# Patient Record
Sex: Male | Born: 1950
Health system: Southern US, Community
[De-identification: ages and names within clinical notes are randomized; demographics above are authoritative.]

## PROBLEM LIST (undated history)

## (undated) DIAGNOSIS — J209 Acute bronchitis, unspecified: Principal | ICD-10-CM

## (undated) DIAGNOSIS — D126 Benign neoplasm of colon, unspecified: Principal | ICD-10-CM

## (undated) DIAGNOSIS — I219 Acute myocardial infarction, unspecified: Secondary | ICD-10-CM

## (undated) DIAGNOSIS — J440 Chronic obstructive pulmonary disease with acute lower respiratory infection: Principal | ICD-10-CM

## (undated) DIAGNOSIS — K5000 Crohn's disease of small intestine without complications: Secondary | ICD-10-CM

## (undated) DIAGNOSIS — K5730 Diverticulosis of large intestine without perforation or abscess without bleeding: Secondary | ICD-10-CM

## (undated) HISTORY — DX: Diverticulosis of large intestine without perforation or abscess without bleeding: K57.30

## (undated) HISTORY — DX: Benign neoplasm of colon, unspecified: D12.6

## (undated) HISTORY — PX: CORONARY ANGIOPLASTY WITH STENT PLACEMENT: SHX49

## (undated) HISTORY — DX: Chronic obstructive pulmonary disease with (acute) lower respiratory infection: J44.0

## (undated) HISTORY — PX: CERVICAL FUSION: SHX112

## (undated) HISTORY — DX: Crohn's disease of small intestine without complications: K50.00

## (undated) HISTORY — DX: Acute bronchitis, unspecified: J20.9

---

## 1998-05-22 ENCOUNTER — Ambulatory Visit (HOSPITAL_BASED_OUTPATIENT_CLINIC_OR_DEPARTMENT_OTHER): Admission: RE | Admit: 1998-05-22 | Discharge: 1998-05-22 | Payer: Self-pay | Admitting: Orthopedic Surgery

## 2001-12-01 ENCOUNTER — Encounter: Payer: Self-pay | Admitting: Emergency Medicine

## 2001-12-01 ENCOUNTER — Observation Stay (HOSPITAL_COMMUNITY): Admission: EM | Admit: 2001-12-01 | Discharge: 2001-12-03 | Payer: Self-pay | Admitting: Emergency Medicine

## 2002-03-11 ENCOUNTER — Encounter: Payer: Self-pay | Admitting: Family Medicine

## 2002-03-11 ENCOUNTER — Ambulatory Visit (HOSPITAL_COMMUNITY): Admission: RE | Admit: 2002-03-11 | Discharge: 2002-03-11 | Payer: Self-pay | Admitting: Family Medicine

## 2002-06-27 ENCOUNTER — Ambulatory Visit (HOSPITAL_COMMUNITY): Admission: RE | Admit: 2002-06-27 | Discharge: 2002-06-27 | Payer: Self-pay | Admitting: Family Medicine

## 2002-06-27 ENCOUNTER — Encounter: Payer: Self-pay | Admitting: Family Medicine

## 2002-11-29 ENCOUNTER — Ambulatory Visit (HOSPITAL_COMMUNITY): Admission: RE | Admit: 2002-11-29 | Discharge: 2002-11-29 | Payer: Self-pay | Admitting: Family Medicine

## 2002-11-29 ENCOUNTER — Encounter: Payer: Self-pay | Admitting: Family Medicine

## 2002-12-03 ENCOUNTER — Ambulatory Visit (HOSPITAL_COMMUNITY): Admission: RE | Admit: 2002-12-03 | Discharge: 2002-12-03 | Payer: Self-pay | Admitting: Family Medicine

## 2002-12-03 ENCOUNTER — Encounter: Payer: Self-pay | Admitting: Family Medicine

## 2003-10-06 ENCOUNTER — Emergency Department (HOSPITAL_COMMUNITY): Admission: EM | Admit: 2003-10-06 | Discharge: 2003-10-07 | Payer: Self-pay | Admitting: Emergency Medicine

## 2003-12-09 ENCOUNTER — Ambulatory Visit (HOSPITAL_COMMUNITY): Admission: RE | Admit: 2003-12-09 | Discharge: 2003-12-10 | Payer: Self-pay | Admitting: Neurosurgery

## 2004-06-11 ENCOUNTER — Ambulatory Visit (HOSPITAL_COMMUNITY): Admission: RE | Admit: 2004-06-11 | Discharge: 2004-06-11 | Payer: Self-pay | Admitting: Neurosurgery

## 2004-11-03 ENCOUNTER — Inpatient Hospital Stay (HOSPITAL_COMMUNITY): Admission: RE | Admit: 2004-11-03 | Discharge: 2004-11-05 | Payer: Self-pay | Admitting: Neurosurgery

## 2005-12-05 ENCOUNTER — Encounter: Admission: RE | Admit: 2005-12-05 | Discharge: 2005-12-05 | Payer: Self-pay | Admitting: Neurosurgery

## 2006-03-07 ENCOUNTER — Encounter
Admission: RE | Admit: 2006-03-07 | Discharge: 2006-06-05 | Payer: Self-pay | Admitting: Physical Medicine and Rehabilitation

## 2006-04-07 ENCOUNTER — Ambulatory Visit: Payer: Self-pay | Admitting: Physical Medicine and Rehabilitation

## 2006-05-24 ENCOUNTER — Ambulatory Visit: Payer: Self-pay | Admitting: Physical Medicine and Rehabilitation

## 2006-09-01 ENCOUNTER — Encounter
Admission: RE | Admit: 2006-09-01 | Discharge: 2006-11-30 | Payer: Self-pay | Admitting: Physical Medicine and Rehabilitation

## 2006-09-01 ENCOUNTER — Ambulatory Visit: Payer: Self-pay | Admitting: Physical Medicine and Rehabilitation

## 2007-02-21 ENCOUNTER — Ambulatory Visit: Payer: Self-pay | Admitting: Physical Medicine and Rehabilitation

## 2007-02-21 ENCOUNTER — Encounter
Admission: RE | Admit: 2007-02-21 | Discharge: 2007-05-22 | Payer: Self-pay | Admitting: Physical Medicine and Rehabilitation

## 2007-02-28 ENCOUNTER — Ambulatory Visit (HOSPITAL_COMMUNITY)
Admission: RE | Admit: 2007-02-28 | Discharge: 2007-02-28 | Payer: Self-pay | Admitting: Physical Medicine and Rehabilitation

## 2007-03-26 ENCOUNTER — Encounter
Admission: RE | Admit: 2007-03-26 | Discharge: 2007-06-24 | Payer: Self-pay | Admitting: Physical Medicine and Rehabilitation

## 2007-04-24 ENCOUNTER — Ambulatory Visit: Payer: Self-pay | Admitting: Physical Medicine and Rehabilitation

## 2007-06-19 ENCOUNTER — Ambulatory Visit: Payer: Self-pay | Admitting: Physical Medicine and Rehabilitation

## 2007-08-14 ENCOUNTER — Encounter
Admission: RE | Admit: 2007-08-14 | Discharge: 2007-11-12 | Payer: Self-pay | Admitting: Physical Medicine and Rehabilitation

## 2007-08-14 ENCOUNTER — Ambulatory Visit: Payer: Self-pay | Admitting: Physical Medicine and Rehabilitation

## 2007-08-17 ENCOUNTER — Ambulatory Visit (HOSPITAL_COMMUNITY): Admission: RE | Admit: 2007-08-17 | Discharge: 2007-08-17 | Payer: Self-pay | Admitting: Internal Medicine

## 2007-10-23 ENCOUNTER — Ambulatory Visit: Payer: Self-pay | Admitting: Physical Medicine and Rehabilitation

## 2008-05-13 ENCOUNTER — Encounter
Admission: RE | Admit: 2008-05-13 | Discharge: 2008-08-11 | Payer: Self-pay | Admitting: Physical Medicine and Rehabilitation

## 2008-05-16 ENCOUNTER — Ambulatory Visit: Payer: Self-pay | Admitting: Physical Medicine and Rehabilitation

## 2008-07-11 ENCOUNTER — Ambulatory Visit: Payer: Self-pay | Admitting: Physical Medicine and Rehabilitation

## 2008-09-02 ENCOUNTER — Encounter
Admission: RE | Admit: 2008-09-02 | Discharge: 2008-09-03 | Payer: Self-pay | Admitting: Physical Medicine and Rehabilitation

## 2008-09-03 ENCOUNTER — Ambulatory Visit: Payer: Self-pay | Admitting: Physical Medicine and Rehabilitation

## 2008-12-02 ENCOUNTER — Encounter
Admission: RE | Admit: 2008-12-02 | Discharge: 2008-12-03 | Payer: Self-pay | Admitting: Physical Medicine and Rehabilitation

## 2008-12-03 ENCOUNTER — Ambulatory Visit: Payer: Self-pay | Admitting: Physical Medicine and Rehabilitation

## 2009-02-16 ENCOUNTER — Encounter
Admission: RE | Admit: 2009-02-16 | Discharge: 2009-02-18 | Payer: Self-pay | Admitting: Physical Medicine and Rehabilitation

## 2009-02-18 ENCOUNTER — Ambulatory Visit: Payer: Self-pay | Admitting: Physical Medicine and Rehabilitation

## 2009-07-02 ENCOUNTER — Ambulatory Visit: Payer: Self-pay | Admitting: Family Medicine

## 2009-07-03 ENCOUNTER — Encounter: Payer: Self-pay | Admitting: Family Medicine

## 2009-07-04 ENCOUNTER — Ambulatory Visit: Payer: Self-pay | Admitting: Family Medicine

## 2009-07-23 ENCOUNTER — Ambulatory Visit: Payer: Self-pay | Admitting: Family Medicine

## 2010-10-31 ENCOUNTER — Ambulatory Visit: Payer: Self-pay | Admitting: Emergency Medicine

## 2010-10-31 DIAGNOSIS — J209 Acute bronchitis, unspecified: Secondary | ICD-10-CM | POA: Insufficient documentation

## 2010-11-03 ENCOUNTER — Telehealth (INDEPENDENT_AMBULATORY_CARE_PROVIDER_SITE_OTHER): Payer: Self-pay

## 2010-11-08 ENCOUNTER — Ambulatory Visit: Payer: Self-pay | Admitting: Emergency Medicine

## 2010-11-08 ENCOUNTER — Telehealth (INDEPENDENT_AMBULATORY_CARE_PROVIDER_SITE_OTHER): Payer: Self-pay | Admitting: *Deleted

## 2010-11-11 ENCOUNTER — Ambulatory Visit: Payer: Self-pay | Admitting: Critical Care Medicine

## 2010-11-11 ENCOUNTER — Encounter: Payer: Self-pay | Admitting: Critical Care Medicine

## 2010-11-11 DIAGNOSIS — J209 Acute bronchitis, unspecified: Secondary | ICD-10-CM

## 2010-11-11 DIAGNOSIS — J4489 Other specified chronic obstructive pulmonary disease: Secondary | ICD-10-CM | POA: Insufficient documentation

## 2010-11-11 DIAGNOSIS — F17200 Nicotine dependence, unspecified, uncomplicated: Secondary | ICD-10-CM

## 2010-11-11 DIAGNOSIS — J449 Chronic obstructive pulmonary disease, unspecified: Secondary | ICD-10-CM

## 2010-11-11 HISTORY — DX: Acute bronchitis, unspecified: J20.9

## 2010-12-09 ENCOUNTER — Ambulatory Visit: Admit: 2010-12-09 | Payer: Self-pay | Admitting: Critical Care Medicine

## 2010-12-18 ENCOUNTER — Encounter: Payer: Self-pay | Admitting: Neurosurgery

## 2010-12-19 ENCOUNTER — Encounter: Payer: Self-pay | Admitting: Neurosurgery

## 2010-12-30 NOTE — Progress Notes (Signed)
Summary: Courtesy call  Phone Note Outgoing Call   Call placed by: Tommas Olp CMA,  November 03, 2010 10:55 AM Summary of Call: Courtesy call - Courtesy mess LOVM of hm number. Initial call taken by: Tommas Olp CMA,  November 03, 2010 10:56 AM

## 2010-12-30 NOTE — Assessment & Plan Note (Signed)
Summary: Pulmonary Consultation   Copy to:  Dr. Fidela Salisbury Primary Tawonda Legaspi/Referring Markail Diekman:  none  CC:  Pulmonary Consult  - COPD, SOB., and COPD initial evaluation.  History of Present Illness: Pulmonary Consultation       This is a 61 year old man who presents for COPD initial evaluation.  The patient complains of history of diagnosed COPD, shortness of breath, chest tightness, wheezing, cough, mucous production, nocturnal awakening, and exercise induced symptoms, but denies chest pain worse with breathing and coughing and congestion.  Prior evaluation and testing has included Cxr.  The dyspnea is described as at rest, with slow ADL's, with walking within room, with walking one or two blocks, with walking stairs, with walking from the car to building, with walking to the mailbox and back, when sleeping, and during the day.  Associated disease(s) include(s) coronary artery disease, indigestion, abdominal pain, hoarseness, sneezing, productive cough, and non-productive cough.  Oxygen evaluation is described as not on supplemental O2.  Prior effective treatment has included short acting beta agonists and oral corticosteroids.    Pt has known hx of copd.  Pt with dyspnea for two years and worse if bends over. Rx recently has been rx pred pulse, ventolin/saba as needed.  This is the second pred pulse in one month.   Preventive Screening-Counseling & Management  Alcohol-Tobacco     Smoking Status: current     Packs/Day: 1.5     Pack years: 18  Current Medications (verified): 1)  Aspir-Low 81 Mg Tbec (Aspirin) .Marland Kitchen.. 1 Tab By Mouth Once Daily 2)  Cheratussin Ac 100-10 Mg/87m Syrp (Guaifenesin-Codeine) .... 5-10cc By Mouth Q6 Hrs As Needed For Cough 3)  Prednisone (Pak) 10 Mg Tabs (Prednisone) .... Use As Directed (12 Day Pack) 4)  Ventolin Hfa 108 (90 Base) Mcg/act Aers (Albuterol Sulfate) ..Marland Kitchen. 1-2 Puffs Q6 Hrs As Needed For Shortness of Breath 5)  Zithromax Z-Pak 250 Mg Tabs  (Azithromycin) .... Use As Directed  Allergies (verified): No Known Drug Allergies  Past History:  Past medical, surgical, family and social histories (including risk factors) reviewed, and no changes noted (except as noted below).  Past Medical History: HEART ATTACK, HX OF (ICD-410.90)   -2003  BRONCHITIS, ACUTE (ICD-466.0) COUGH   Past Surgical History: Neck surgery x 2   Family History: Reviewed history from 10/31/2010 and no changes required. Mother, D, intestional blockage Father, D, heart disease  Social History: Reviewed history from 10/31/2010 and no changes required. Single Disabled,  former aPublic relations account executive Current Smoker - 1/2 -1 pack daily, started at age [age]Alcohol use-yes - 5 drinks weekly Drug use-no Regular exercise-no 2 children  Packs/Day:  1.5 Pack years:  610 Review of Systems       The patient complains of shortness of breath with activity, shortness of breath at rest, productive cough, non-productive cough, irregular heartbeats, indigestion, loss of appetite, abdominal pain, headaches, sneezing, and depression.  The patient denies coughing up blood, chest pain, acid heartburn, weight change, difficulty swallowing, sore throat, tooth/dental problems, nasal congestion/difficulty breathing through nose, itching, ear ache, anxiety, hand/feet swelling, joint stiffness or pain, rash, change in color of mucus, and fever.        See HPI for Pulmonary, Cardiac, General, and ENT review of systems.  Vital Signs:  Patient profile:   60year old male Height:      70 inches Weight:      202.31 pounds BMI:     29.13 O2 Sat:  97 % on Room air Temp:     98.2 degrees F oral Pulse rate:   95 / minute BP sitting:   130 / 80  (left arm) Cuff size:   regular  Vitals Entered By: Raymondo Band RN (November 11, 2010 8:57 AM)  O2 Flow:  Room air CC: Pulmonary Consult  - COPD, SOB., COPD initial evaluation Comments Medications reviewed with  patient Daytime contact number verified with patient. Raymondo Band RN  November 11, 2010 8:58 AM    Physical Exam  Additional Exam:  Gen: Pleasant, well-nourished, in no distress,  normal affect ENT: No lesions,  mouth clear,  oropharynx clear, no postnasal drip Neck: No JVD, no TMG, no carotid bruits Lungs: No use of accessory muscles, no dullness to percussion, distant bs. no wheeze, no rhonchi Cardiovascular: RRR, heart sounds normal, no murmur or gallops, no peripheral edema Abdomen: soft and NT, no HSM,  BS normal Musculoskeletal: No deformities, no cyanosis or clubbing Neuro: alert, non focal Skin: Warm, no lesions or rashes    Impression & Recommendations:  Problem # 1:  COPD (ICD-496) Assessment Deteriorated Copd , severe emphysema, Golds stage II   FeV1 54% predicted, active smoking abuse plan Prednisone 56m.  4 a day for three days, three a day for 4 days two a day for 4 days then one a day until pak empty FSunocoAvelox one a day (3 samples 2 sent to pharmacy) Start Symbicort two puff twice daily Stop smoking, start Chantix Return 1 month [place]   Medications Added to Medication List This Visit: 1)  Cheratussin Ac 100-10 Mg/556mSyrp (Guaifenesin-codeine) .... 5-10cc by mouth  every 6 hours  as needed for cough 2)  Prednisone (pak) 10 Mg Tabs (Prednisone) .... Take 4 a day for three days then 3 a day for 4 days, two a day for 4 days then one a day until prednisone pak empty. 3)  Ventolin Hfa 108 (90 Base) Mcg/act Aers (Albuterol sulfate) ...Marland Kitchen 1-2 puffs every 4-6 hours  as needed for shortness of breath 4)  Avelox 400 Mg Tabs (Moxifloxacin hcl) .... By mouth daily 5)  Chantix Starting Month Pak 0.5 Mg X 11 & 1 Mg X 42 Misc (Varenicline tartrate) .... Take as directed---refills are to be for the continuing pak  Complete Medication List: 1)  Aspir-low 81 Mg Tbec (Aspirin) ...Marland Kitchen 1 tab by mouth once daily 2)  Cheratussin Ac 100-10 Mg/75m64myrp  (Guaifenesin-codeine) .... 5-10cc by mouth  every 6 hours  as needed for cough 3)  Prednisone (pak) 10 Mg Tabs (Prednisone) .... Take 4 a day for three days then 3 a day for 4 days, two a day for 4 days then one a day until prednisone pak empty. 4)  Ventolin Hfa 108 (90 Base) Mcg/act Aers (Albuterol sulfate) ....Marland Kitchen1-2 puffs every 4-6 hours  as needed for shortness of breath 5)  Avelox 400 Mg Tabs (Moxifloxacin hcl) .... By mouth daily 6)  Chantix Starting Month Pak 0.5 Mg X 11 & 1 Mg X 42 Misc (Varenicline tartrate) .... Take as directed---refills are to be for the continuing pak  Other Orders: New Patient Level V (99(47829pirometry w/Graph (94010)  Patient Instructions: 1)  Prednisone 62m62m4 a day for three days, three a day for 4 days two a day for 4 days then one a day until pak empty 2)  Finish Zpak 3)  Start Avelox one a day (3 samples 2 sent to pharmacy) 4)  Start Symbicort two puff twice daily 5)  Stop smoking, start Chantix 6)  Return 1 month [place]  Prescriptions: CHANTIX STARTING MONTH PAK 0.5 MG X 11 & 1 MG X 42  MISC (VARENICLINE TARTRATE) Take as directed---Refills are to be for the continuing pak  #1 month x 2   Entered and Authorized by:   Elsie Stain MD   Signed by:   Elsie Stain MD on 11/11/2010   Method used:   Electronically to        [facility] [phone]* (retail)       [place], [place]  [address]       Ph: [phone]       Fax: [phone]   RxID:   [id number] AVELOX 400 MG  TABS (MOXIFLOXACIN HCL) By mouth daily  #2 x 0   Entered and Authorized by:   Elsie Stain MD   Signed by:   Elsie Stain MD on 11/11/2010   Method used:   Electronically to        [facility] [phone]* (retail)       [place], [place]  [address]       Ph: [phone]       Fax: [phone]   RxID:   [id number] SYMBICORT 160-4.5 MCG/ACT  AERO (BUDESONIDE-FORMOTEROL FUMARATE) Two puffs twice daily  #1 x 6    Entered and Authorized by:   Elsie Stain MD   Signed by:   Elsie Stain MD on 11/11/2010   Method used:   Electronically to        [facility] [phone]* (retail)       [place], [place]  [address]       Ph: [phone]       Fax: [phone]   RxID:   [id number]     Appended Document: Pulmonary Consultation fax jeff henderson

## 2010-12-30 NOTE — Progress Notes (Signed)
  Phone Note Outgoing Call   Call placed by: Georgiann Mccoy,  November 08, 2010 12:52 PM Call placed to: Specialist Summary of Call: Made appt with Pulmonary, Dr Joya Gaskins in [facility] on Dec 15-9am, called pt and advised wife who was still here in office

## 2010-12-30 NOTE — Assessment & Plan Note (Signed)
Summary: CHEST CONGESTION/TM   Vital Signs:  Patient Profile:   60 Years Old Male CC:      Sore throat, cough x 3 days Height:     70.5 inches Weight:      205 pounds O2 Sat:      99 % O2 treatment:    Room Air Temp:     98.2 degrees F oral Pulse rate:   87 / minute Pulse rhythm:   regular Resp:     16 per minute BP sitting:   131 / 81  (left arm) Cuff size:   regular  Vitals Entered By: Georgiann Mccoy (October 31, 2010 1:21 PM)                  Current Allergies (reviewed today): No known allergies History of Present Illness History from: patient Chief Complaint: Sore throat, cough x 3 days History of Present Illness: Patient complains of onset of cold symptoms for 2 days.  They have been using no OTC meds. No sore throat + cough + chest congestion No pleuritic pain No wheezing + nasal congestion + post-nasal drainage + sinus pain/pressure No itchy/red eyes No earache No hemoptysis + SOB No chills/sweats + fever No nausea No vomiting No abdominal pain No diarrhea No skin rashes No fatigue No myalgias No headache   Current Meds ASPIR-LOW 81 MG TBEC (ASPIRIN) 1 tab by mouth once daily ZITHROMAX 250 MG TABS (AZITHROMYCIN) 2 by mouth on day 1, then 1 daily for 7 more days CHERATUSSIN AC 100-10 MG/5ML SYRP (GUAIFENESIN-CODEINE) 5-10cc by mouth Q6 hrs as needed for cough PREDNISONE (PAK) 10 MG TABS (PREDNISONE) 6-day taper, use as directed  REVIEW OF SYSTEMS Constitutional Symptoms      Denies fever, chills, night sweats, weight loss, weight gain, and fatigue.  Eyes       Denies change in vision, eye pain, eye discharge, glasses, contact lenses, and eye surgery. Ear/Nose/Throat/Mouth       Complains of frequent runny nose, sinus problems, sore throat, and hoarseness.      Denies hearing loss/aids, change in hearing, ear pain, ear discharge, dizziness, frequent nose bleeds, and tooth pain or bleeding.  Respiratory       Complains of dry cough and  shortness of breath.      Denies productive cough, wheezing, asthma, bronchitis, and emphysema/COPD.  Cardiovascular       Denies murmurs, chest pain, and tires easily with exhertion.    Gastrointestinal       Denies stomach pain, nausea/vomiting, diarrhea, constipation, blood in bowel movements, and indigestion. Genitourniary       Denies painful urination, kidney stones, and loss of urinary control. Neurological       Complains of headaches.      Denies paralysis, seizures, and fainting/blackouts. Musculoskeletal       Denies muscle pain, joint pain, joint stiffness, decreased range of motion, redness, swelling, muscle weakness, and gout.  Skin       Denies bruising, unusual mles/lumps or sores, and hair/skin or nail changes.  Psych       Denies mood changes, temper/anger issues, anxiety/stress, speech problems, depression, and sleep problems.  Past History:  Past Medical History: Reviewed history from 07/23/2009 and no changes required. Unremarkable  Past Surgical History: Reviewed history from 07/23/2009 and no changes required. Neck surgery Denies surgical history  Family History: Mother, D, Infection Father, D, UNK  Social History: Single Current Smoker - 1 pack daily, 44 yrs Alcohol use-yes -  5 drinks weekly Drug use-no Regular exercise-no Physical Exam General appearance: well developed, well nourished, mild distress.  Smells strongly of cig smoke Ears: normal, no lesions or deformities Nasal: clear discharge Oral/Pharynx: tongue normal, posterior pharynx without erythema or exudate Chest/Lungs: no rales, wheezes, or rhonchi bilateral, breath sounds equal without effort Heart: regular rate and  rhythm, no murmur Skin: no obvious rashes or lesions MSE: oriented to time, place, and person Assessment New Problems: BRONCHITIS, ACUTE (ICD-466.0) COUGH (ICD-786.2)  CXR normal  Patient Education: Patient and/or caregiver instructed in the following: rest,  fluids, Tylenol prn, Ibuprofen prn, quit smoking.  Plan New Medications/Changes: PREDNISONE (PAK) 10 MG TABS (PREDNISONE) 6-day taper, use as directed  #QS x 0, 10/31/2010, Fidela Salisbury MD CHERATUSSIN AC 100-10 MG/5ML SYRP (GUAIFENESIN-CODEINE) 5-10cc by mouth Q6 hrs as needed for cough  #6oz x 0, 10/31/2010, Fidela Salisbury MD ZITHROMAX 250 MG TABS (AZITHROMYCIN) 2 by mouth on day 1, then 1 daily for 7 more days  #QS x 0, 10/31/2010, Fidela Salisbury MD  New Orders: Est. Patient Level IV [16384] T-Chest x-ray, 2 views [71020] Pulse Oximetry (single measurment) [94760] Nebulizer Tx [94640] Albuterol Sulfate Sol 50m unit dose [[T3646]Ipratropium inhalation sol. unit dose [[O0321]Planning Comments:   1)  Take the prescribed antibiotic as instructed. 2)  Use nasal saline solution (over the counter) at least 3 times a day. 3)  Use over the counter decongestants like Zyrtec-D every 12 hours as needed to help with congestion. 4)  Can take tylenol every 6 hours or motrin every 8 hours for pain or fever. 5)  Follow up with your primary doctor  if no improvement in 5-7 days, sooner if increasing pain, fever, or new symptoms.     The patient and/or caregiver has been counseled thoroughly with regard to medications prescribed including dosage, schedule, interactions, rationale for use, and possible side effects and they verbalize understanding.  Diagnoses and expected course of recovery discussed and will return if not improved as expected or if the condition worsens. Patient and/or caregiver verbalized understanding.  Prescriptions: PREDNISONE (PAK) 10 MG TABS (PREDNISONE) 6-day taper, use as directed  #QS x 0   Entered and Authorized by:   JFidela SalisburyMD   Signed by:   JFidela SalisburyMD on 10/31/2010   Method used:   Print then Give to Patient   RxID:   [id number] AC 100-10 MG/5ML SYRP (GUAIFENESIN-CODEINE) 5-10cc by mouth Q6 hrs as needed for cough  #6oz x  0   Entered and Authorized by:   JFidela SalisburyMD   Signed by:   JFidela SalisburyMD on 10/31/2010   Method used:   Print then Give to Patient   RxID:   [id number] 250 MG TABS (AZITHROMYCIN) 2 by mouth on day 1, then 1 daily for 7 more days  #QS x 0   Entered and Authorized by:   JFidela SalisburyMD   Signed by:   JFidela SalisburyMD on 10/31/2010   Method used:   Print then Give to Patient   RxID:   [id number]  Medication Administration  Medication # 1:    Medication: Albuterol Sulfate Sol 169munit dose    Diagnosis: BRONCHITIS, ACUTE (ICD-466.0)    Dose: 3.0 mgs    Route: inhaled    Exp Date: 01/27/2011    Lot #: [id number]    Mfr: Mylan    Given by: AmGeorgiann MccoyDecember  4, 2011 2:29 PM)  Medication # 2:  Medication: Ipratropium inhalation sol. unit dose    Diagnosis: BRONCHITIS, ACUTE (ICD-466.0)    Dose: 0.5    Route: inhaled    Exp Date: 01/27/2011    Lot #: [id number]    Mfr: Mylan    Given by: Georgiann Mccoy (October 31, 2010 2:30 PM)  Orders Added: 1)  Est. Patient Level IV [94585] 2)  T-Chest x-ray, 2 views [71020] 3)  Pulse Oximetry (single measurment) [94760] 4)  Nebulizer Tx [94640] 5)  Albuterol Sulfate Sol 74m unit dose [J7613] 6)  Ipratropium inhalation sol. unit dose [[F2924]    Medication Administration  Medication # 1:    Medication: Albuterol Sulfate Sol 1551munit dose    Diagnosis: BRONCHITIS, ACUTE (ICD-466.0)    Dose: 3.0 mgs    Route: inhaled    Exp Date: 01/27/2011    Lot #: [id number]    Mfr: Mylan    Given by: AmGeorgiann MccoyDecember  4, 2011 2:29 PM)  Medication # 2:    Medication: Ipratropium inhalation sol. unit dose    Diagnosis: BRONCHITIS, ACUTE (ICD-466.0)    Dose: 0.5    Route: inhaled    Exp Date: 01/27/2011    Lot #: [id number]    Mfr: Mylan    Given by: AmGeorgiann MccoyDecember  4, 2011 2:30 PM)  Orders Added: 1)  Est. Patient Level IV [9[46286])  T-Chest x-ray, 2 views [71020] 3)  Pulse Oximetry  (single measurment) [94760] 4)  Nebulizer Tx [94640] 5)  Albuterol Sulfate Sol 51m14mnit dose [J7613] 6)  Ipratropium inhalation sol. unit dose [J7[N8177]

## 2010-12-30 NOTE — Assessment & Plan Note (Signed)
Summary: COUGH,SOB,HEADACHE/TJ Room 5   Vital Signs:  Patient Profile:   60 Years Old Male CC:      Trouble breathing Height:     70.5 inches Weight:      205 pounds O2 Sat:      97 % O2 treatment:    Room Air Temp:     98.7 degrees F oral Pulse rate:   84 / minute Pulse rhythm:   regular Resp:     20 per minute BP sitting:   153 / 79  (left arm) Cuff size:   regular  Vitals Entered By: Georgiann Mccoy (November 08, 2010 12:18 PM)                  Current Allergies (reviewed today): No known allergies History of Present Illness History from: patient Chief Complaint: Trouble breathing History of Present Illness: Patient complains of onset of cold symptoms for 10 days.  He was seen here last week for similar symptoms but is getting better.  Treated with Zpak, 6 day taper Pred and breathing treatment. His wife is still sick.  He is a heavy smoker.  He doesn't have a PCP. + sore throat + cough No pleuritic pain + wheezing No nasal congestion No post-nasal drainage No sinus pain/pressure + chest congestion No itchy/red eyes No earache No hemoptysis + SOB No chills/sweats No fever No nausea No vomiting No abdominal pain No diarrhea No skin rashes No fatigue No myalgias No headache   REVIEW OF SYSTEMS Constitutional Symptoms      Denies fever, chills, night sweats, weight loss, weight gain, and fatigue.  Eyes       Denies change in vision, eye pain, eye discharge, glasses, contact lenses, and eye surgery. Ear/Nose/Throat/Mouth       Denies hearing loss/aids, change in hearing, ear pain, ear discharge, dizziness, frequent runny nose, frequent nose bleeds, sinus problems, sore throat, hoarseness, and tooth pain or bleeding.  Respiratory       Complains of shortness of breath.      Denies dry cough, productive cough, wheezing, asthma, bronchitis, and emphysema/COPD.  Cardiovascular       Denies murmurs, chest pain, and tires easily with exhertion.     Gastrointestinal       Complains of stomach pain.      Denies nausea/vomiting, diarrhea, constipation, blood in bowel movements, and indigestion. Genitourniary       Denies painful urination, kidney stones, and loss of urinary control. Neurological       Denies paralysis, seizures, and fainting/blackouts. Musculoskeletal       Denies muscle pain, joint pain, joint stiffness, decreased range of motion, redness, swelling, muscle weakness, and gout.  Skin       Denies bruising, unusual mles/lumps or sores, and hair/skin or nail changes.  Psych       Denies mood changes, temper/anger issues, anxiety/stress, speech problems, depression, and sleep problems.  Past History:  Past Medical History: Reviewed history from 07/23/2009 and no changes required. Unremarkable  Past Surgical History: Reviewed history from 07/23/2009 and no changes required. Neck surgery Denies surgical history Physical Exam General appearance: well developed, well nourished, no acute distress.  smells of smoke Nasal: clear discharge Oral/Pharynx: tongue normal, posterior pharynx without erythema or exudate Neck: neck supple,  trachea midline, no masses Chest/Lungs: scattered rhonchi Heart: regular rate and  rhythm, no murmur Extremities: no swelling Skin: no obvious rashes or lesions MSE: oriented to time, place, and person Assessment Acute bronchitis with  bronchospasm  Plan New Medications/Changes: ZITHROMAX Z-PAK 250 MG TABS (AZITHROMYCIN) use as directed  #1 x 0, 11/08/2010, Fidela Salisbury MD VENTOLIN HFA 108 (90 BASE) MCG/ACT AERS (ALBUTEROL SULFATE) 1-2 puffs Q6 hrs as needed for shortness of breath  #1 x 1, 11/08/2010, Fidela Salisbury MD PREDNISONE (PAK) 10 MG TABS (PREDNISONE) use as directed (12 day pack)  #QS x 0, 11/08/2010, Fidela Salisbury MD  New Orders: Est. Patient Level IV [59733] Pulse Oximetry [94760] Planning Comments:   Another Rx for Zpak given and then gave another round of  Prednisone.  Also Rx for Albuterol.  Suggest patient see pulmonology since I expect COPD is present and he will need to be managed.  Also needs to establish with PCP.  If worsening symptoms, CP, more severe SOB, go to ER.    The patient and/or caregiver has been counseled thoroughly with regard to medications prescribed including dosage, schedule, interactions, rationale for use, and possible side effects and they verbalize understanding.  Diagnoses and expected course of recovery discussed and will return if not improved as expected or if the condition worsens. Patient and/or caregiver verbalized understanding.  Prescriptions: ZITHROMAX Z-PAK 250 MG TABS (AZITHROMYCIN) use as directed  #1 x 0   Entered and Authorized by:   Fidela Salisbury MD   Signed by:   Fidela Salisbury MD on 11/08/2010   Method used:   Print then Give to Patient   RxID:   [id number] VENTOLIN HFA 108 (90 BASE) MCG/ACT AERS (ALBUTEROL SULFATE) 1-2 puffs Q6 hrs as needed for shortness of breath  #1 x 1   Entered and Authorized by:   Fidela Salisbury MD   Signed by:   Fidela Salisbury MD on 11/08/2010   Method used:   Print then Give to Patient   RxID:   [id number] PREDNISONE (PAK) 10 MG TABS (PREDNISONE) use as directed (12 day pack)  #QS x 0   Entered and Authorized by:   Fidela Salisbury MD   Signed by:   Fidela Salisbury MD on 11/08/2010   Method used:   Print then Give to Patient   RxID:   [id number]   Orders Added: 1)  Est. Patient Level IV [66196] 2)  Pulse Oximetry [94098]

## 2011-04-12 NOTE — Assessment & Plan Note (Signed)
Marland Kitchen  David Woodard is a 60 year old single gentleman who is followed in  our [facility] for chronic pain related to his neck.   He has a history, which is positive for previous cervical spine surgery  fusion at C5-6 and C6-7 in January 2005 and December 2005 by Dr. Joya Salm.   He has had continual chronic neck pain described as an 8 on a scale of  10.  Pain is localized to the posterior neck radiating to the left  scapula.  Pain is worse with prolonged sitting or standing, improves  with medication and heat, as he reports fairly good relief with current  meds.   Medications prescribed through this clinic include Lyrica 50 mg twice a  day, Ultram 50 mg 4 times a day, and Lidoderm on a p.r.n. basis.  He  also occasionally uses over-the-counter Aleve not more than 2 tablets  every other day or so.   Functional status is as follows.  He is able to walk 30 minutes at a  time.  He can climb stairs.  He does not drive.  He is independent with  self-care, overall high functioning gentleman.   Occasionally, reports spasms in the neck and left shoulder.  Review of  systems is positive for shortness of breath.  He does smoke a pack of  cigarettes a day.  I have counseled him against this as well.   No changes in past medical, social, or family history.   I have on multiple occasions requested he follow up with primary care  for any other medical problems and preventative care.  I asked him if he  has a primary care physician.  He states I forget her name.  I asked  him what the name of the practice is.  He states he does not know the  name of the practice as well.  Again, I reiterate the importance of  having primary care doctor.   Exam today, blood pressure is 137/83, pulse 84, respiration 18, 98%  saturated on room air.  He is a well-developed, well-nourished gentleman  who does not appear in any distress.  He is oriented x3.  Speech is  clear.  Affect is bright.  He  is alert, cooperative, and pleasant.  Follows commands without difficulty, answers questions appropriately.   Cranial nerves and coordination are intact.  Reflexes are 1+ in the  upper extremities at biceps, triceps, brachioradialis, 1+ at the  patellar and zero at the Achilles tendons bilaterally.   He has good strength in both upper extremities without any focal deficit  appreciated.  Sensory exam is normal as well.  No deficits to light  touch are noted.   He has diminished range of motion in his cervical spine in all planes,  complains of pain especially with rotation to the left.  He has full  shoulder range of motion; however, he does complain a little bit of pain  in the left lateral neck with abduction of the left shoulder.  However,  he has full shoulder motion.   Transitioning from sitting to standing is done with ease.  Gait is  nonantalgic.  Tandem gait, Romberg tests are all performed without  difficulty.   IMPRESSION:  1. Cervicalgia.  2. December 09, 2003, C5-6, C6-S7 fusion, Dr. Joya Salm.  3. November 03, 2004, C5-6, C6-7 fusion, Dr. Joya Salm.   I have reviewed treatment options with David Woodard today to include using  TENS unit, cervical collar for conservative  management.  He finds his  medications currently prescribed Lyrica, Ultram, and Lidoderm are all  helping with overall reducing his cervicalgia.  He is overall pleased  with his pain management.  He expresses that he is not interested in any  type of invasive management at this time.  He has been stable on the  above medications.  He reports no adverse effects from these.  We will  see him back in 3 months.  He will need his medications called in for  him next month.           ______________________________  Franchot Gallo, M.D.     DMK/MedQ  D:  02/18/2009 09:58:28  T:  02/18/2009 23:30:19  Job #:  [id number]

## 2011-04-12 NOTE — Assessment & Plan Note (Signed)
David Woodard is a 60 year old gentleman who has returned to our pain and  rehabilitative clinic for a refill of his medications. He was last seen  by me on 04/25/2007.   He has a history of cervical fusion x2 by Dr. Joya Woodard, C5, C6, and C7 in  2005 and 2006.   He states that his average pain is about a 6 on a scale of 10 localized  to the posterior neck radiating to the left scapular region. The pain is  worse with activities, walking, sitting, and improves with rest, heat,  and he does use the cervical collar on a p.r.n. basis as well. He gets  good relief with the current medications that he is on.   Medications provided by this clinic include:  1. Lyrica 50 mg twice daily.  2. Ultracet 1 to 2 tablets b.i.d. p.r.n. 90 per month.   He is walking about 20 minutes at a time. He is independent with his  health care. He enjoys walking his dog. He reports no new changes on his  review of systems other than some intermittent shortness of breath. I  asked him to follow up with his primary care physician.   He recently was seen at an urgent care walk in clinic for some abdominal  complaints which are now resolved. This is within the last month.   He continues to smoke a pack of cigarettes per day.   PHYSICAL EXAMINATION:  VITAL SIGNS:  Blood pressure 129/67, pulse 86,  respirations 18, 97% saturated on room air.  GENERAL:  He is a well developed, well nourished gentleman who does not  appear in any distress. He is oriented x3. His affect is bright and  alert. He is cooperative and pleasant. He follows commands without  difficulty. He transitions from sitting to standing without difficulty.  Gait in the room is non-antalgic. Tandem gait and Romberg's test are  performed adequately. Reflexes are 1+ throughout the upper and lower  extremities. No abnormal tone is noted.  No clonus is noted. Motor  strength is within the 5/5 range without focal deficits. Rotation of his  neck to the right, he  has about 60% of his full motion and to the left  he has about 30% of full motion. He has full shoulder range of motion  however.   IMPRESSION:  1. Cervicalgia.  2. Status post C5, C6, C7 cervical fusion, Dr. Joya Woodard, 2005 and 2006.  3. Limitation in cervical range of motion.  4. Nicotine addiction.  5. Mild depression without suicidal ideation.  6. History of coronary artery disease.  7. History of severe chronic obstructive pulmonary disease.  8. History of peptic ulcer disease remotely.   PLAN:  1. We will refill his Ultracet 1 to 2 p.o. b.i.d. number of 90 with      one refill, and we will refill his Lyrica 50 mg 1      p.o. b.i.d. number of 60, two refills.  2. I encouraged him to follow up with his PCP for shortness of breath.      We will see him back in two months.           ______________________________  David Woodard, M.D.     DMK/MedQ  D:  06/20/2007 13:16:18  T:  06/21/2007 02:52:54  Job #:  [id number]

## 2011-04-12 NOTE — Assessment & Plan Note (Signed)
David Woodard is a 60 year old single white gentleman who has been  followed in our [facility] for about 2 years now for  chronic cervicalgia.  He is status post a fusion by Dr. Joya Salm at C5-C6  and C6-C7 in 2005 and 2006.  He is back in today for refill of his  medications.  He was last seen by me on October 29, 2007.  He states  that his pain is a bit worse since his medications ran out.  His average  pain is about an 8 on a scale of 10, localized to the cervical region  and the left scapular region.  Pain is worse with activities including  rotation of his neck.  His pain gets worse if he sits longer than 15  minutes and stands longer than 15 minutes or walks longer than 15-20  minutes.  He is constantly changing position.  He does use a cervical  collar intermittently to help him with his pain.  He has trialed the  TENS unit in the past, does not find it that helpful.  His sleep tends  to be poor.  Pain improved with heat, medications, topical patches, and  bracing.   He gets fair relief with the meds that are prescribed by this clinic.   He does not drive.  He is independent with his self-care.  Lives alone.  Enjoys watching the [employer] and he is interested in horse racing.   He has 2 horses, one's name is [employer] and the other's name is  [employer].   REVIEW OF SYSTEMS:  Denies problems controlling bowel or bladder.  He  denies depression or anxiety.  Denies suicidal ideation.  Denies any new  numbness.  He does report some intermittent tingling in the left hand  when he sits for a prolonged period of time.  No trouble walking.  Review of systems is negative for general symptoms, gastrointestinal, or  urinary symptoms.  He does report some occasional shortness of breath.   PAST MEDICAL HISTORY:  Remarkable for a remote history of MI and also  remote history of gastric ulcers.   SURGERY:  Status post C5-C6 and C6-C7 cervical fusion in 2005  and 2006  by Dr. Joya Salm.  Also, remote right rotator cuff surgery.   The patient denies illegal substance use.  He denies using alcohol.  He  smokes less than a pack of cigarette per Woodard.   No other changes in family history.   PHYSICAL EXAMINATION:  VITAL SIGNS:  Blood pressure is 124/87, pulse 95,  respirations 18, and 97% saturation on room air.  GENERAL:  He is a well-developed, well-nourished gentleman who does not  appear in any distress.  He is oriented x3.  Speech is clear.  Affect is  bright.  He is alert, cooperative, and pleasant.  He follows commands  without difficulty.   Transitioning from sitting-to-standing is done without difficulty.  Gait  in the room is normal.  Tandem gait and Romberg's tests are performed  adequately.   He has limitations in his cervical range of motion in all planes,  limited with flexion, extension, rotation, and lateral flexion in all  planes.  He has full shoulder range of motion.   Reflexes are diminished overall in the upper extremities, zero at the  biceps and triceps, and 1+ at the brachioradialis bilaterally.  He has  1+ reflex at the patellar tendon and zero at the ankles bilaterally.  No  abnormal tone is noted.  No clonus is noted.  Sensory exam is intact in  the upper as well as lower extremities.  Motor strength is 5/5 in the  upper extremities without focal abnormalities.   IMPRESSION:  1. Cervicalgia.  Overall, doing well functionally.  He has been off      his medications for a couple of months now.  He is complaining of      some increased pain.  His pain scores have increased from the 6-7      range up to 8/10 range currently.  He is requesting refill of his      medications now.  2. Status post C5-C6 and C6-C7 cervical fusion by Dr. Joya Salm in 2005      and 2006.  3. Nicotine addiction.  4. The patient currently denies any problems with depression or      anxiety.  He had admitted to some depression in the past;  however,      he states he does not have problems with that at this point.   At the last visit, I had requested he follow up with primary care.  He  states he has not obtained a primary care doctor yet.  He does see in  Urgent Care on as needed basis in [place].  I have again requested  that he find a primary care physician who he can get regular check ups  from, in light of the fact that he does have a history of heart disease,  COPD, and history of ulcer disease.  He states he understands and will  attempt to comply with this request.   At this point, for treatment of Mr. Kazmierski pain, he is not interested  in any kind of cervical injections.  He is not interested in physical  therapies as he has done that.  He has soft collars, cervical collar, as  well as the hard collar at home, which he uses on a p.r.n. basis.  We  will refill the following medications for him; Lidoderm 1 patch up to 12  hours on and 12 hours off #30 with 1 refill, Ultracet 1-2 p.o. t.i.d.  p.r.n. neck pain #180, 43-monthsupply with 1 refill, and Lyrica 50 mg 1  p.o. b.i.d. x2 weeks then t.i.d., 149-monthupply with 1 refill.  We will  see him back in 6-8 weeks.  He brought in a [employer].  This was filled out to the best that I can today.  I do not have  some of the surgical information and request details.  Mr. EbRodeheaveras  present as I filled it out and we will have it mailed for him.           ______________________________  DeFranchot GalloM.D.     DMK/MedQ  D:  05/16/2008 11:47:32  T:  05/17/2008 02:24:55  Job #:  [id number]

## 2011-04-12 NOTE — Assessment & Plan Note (Signed)
[name] is a 60 year old gentleman who is back in today to our [facility] for refill of his medications.   He was last seen by me on March 26, 2007.   He is status post cervical fusion x 2 by Dr. Joya Salm, C5-C6, C6-C7, 2005  and 2006.   He is back in today and states his average pain is about a 7 on a scale  of 10.  He is getting good relief with medications prescribed by this  clinic.  Pain is typically localized to the left cervical and scapular  region, overall improved since last visit.  He has been using a cervical  collar intermittently for his more painful days.   He is walking up to 20 minutes at a time.  He is independent with his  self care including meal prep.  He needs some assistance with higher  level household duties, shopping.  He admits to some depression, denies  suicidal ideation or anxiety, denies problems controlling bowel or  bladder.   No changes in his past medical, social or family history.  He is smoking  less than half a pack of cigarettes a day.  Again, I cautioned him  against this.  He currently does not have a primary care physician.  I  strongly encouraged him to find somebody he is comfortable with to  manage his general medical problems.   On exam today his blood pressure is 118/78, pulse 89, respirations 18,  98% saturated on room air.  He is a thin built gentleman who appears his  stated age.  He is oriented x 3.  His affect is bright, alert.  He is  cooperative and pleasant.  He follows commands without difficulty.   He transitions from sitting to standing easily.  Gait in the room is  nonantalgic.  His balance is good overall.  Tandem gait and Romberg  tests are performed adequately.   He has limitations in lumbar motion in all planes.  Seated reflexes are  1+ at the biceps and triceps bilaterally, 2+ at the brachioradialis, 2+  at the patellar tendons, zero to 1+ at bilateral Achilles tendons.  He  has no abnormal  tone noted.  Motor strength is good in the upper  extremities.  No focal weakness is appreciated.  He has limitations in  cervical range of motion, especially with rotation to the left.  He  lacks about 20% of his motion.  He does, however, have full shoulder  range of motion without pain in the shoulder joint.   Pin prick is tested and is intact in both upper extremities.  No focal  sensory deficits are appreciated.   IMPRESSION:  1. Cervicalgia.  2. Status post C5-C6, C6-C7 cervical fusion, Dr. Joya Salm, 2005, 2006.  3. Limitation in cervical range of motion.  4. Nicotine addiction.  5. Mild depression without suicidal ideation.  6. History of coronary artery disease.  7. History of COPD.  8. History of peptic ulcer disease remotely.   PLAN:  Encourage him to continue to use his cervical collar on a p.r.n.  basis to help manage his pain.  He states that this has helped quite a  bit over the last month.  We will refill his Ultracet 1-2 p.o. b.i.d.  #90 no refills, and Lyrica 50 mg 1 p.o. b.i.d. #60 3 refills. We will  see him back in two months.  He has been stable on these medications.  He is  not  displaying any aberrant behavior.  He is obtaining good relief with  the current medications that he is on.  He is able to maintain a  functional lifestyle, independent with self care and some lighter  household type activities.           ______________________________  Franchot Gallo, M.D.     DMK/MedQ  D:  04/25/2007 10:34:32  T:  04/25/2007 11:55:49  Job #:  [id number]   cc:   Leeroy Cha, M.D.  Fax: [phone]

## 2011-04-12 NOTE — Assessment & Plan Note (Signed)
FOLLOW UP VISIT NOTE   HISTORY OF PRESENT ILLNESS:  The patient is a 60 year old white  gentleman who is being seen in our pain and rehabilitative clinic for  chronic cervicalgia.  He has a history of cervical fusion times two by  Dr. Kristeen Mans at C5-6, C6-7 in 2005 and 2006.   He is back in today and is presenting with increased neck pain.  Previous pain scores were at the last visit in July was 6/10 and today  he report 9/10.  He has a 3 week history of increased pain.  He denies  any falls, trauma, any injuries to the neck.  He denies any kind of  activity that may have exacerbated his pain.   He states that it came on gradually and has developed into the point  where it is a constant deep achy sharp pain, which is constant.  He has  had poor sleep for the last week.  He has been sleeping alternately in  his recliner or his bed.   He denies any new weakness, numbness, tingling.  He reports his balance  is good.  No history of falls.  Bowel and bladder are working fine as  well.  For treatment he has been taking Motrin 400 to 600 mg up to 4  times a day.  Denies any abdominal complaints.  He has a soft collar,  which he has been wearing rather sporadically for a couple hours a day  over the last 3 weeks.   Denies any increased pain into the upper extremities or down the lower  extremities.  Again no weakness.   He reports his activity and enjoyment of life is almost completely  interfered with this new level of pain.  The pain is worse with walking,  sitting and standing and improves with heat and rest, as well as  medications.  He is getting fair relief with current medications he is  on.   CURRENT MEDICATIONS:  Medications provided by this clinic include:  Lidoderm patches on a p.r.n. basis; Lyrica 50 mg, 1 p.o. b.i.d.; and  Ultracet up to 4 tablets per day on a p.r.n. basis.   REVIEW OF SYSTEMS:  Positive for neck spasms and some depression.  Denies suicidal ideation.  Review  of systems otherwise noncontributory.   SOCIAL HISTORY:  He continues to smoke a little less that 1/2 a pack of  cigarettes a day.  He lives alone.   PAST MEDICAL HISTORY:  No other changes in his past medical history.  He  reports he has had a recent physical with [facility].  He does not  remember the name of the physician who examined him but he was told he  was in good health.   PHYSICAL EXAMINATION:  Blood pressure 134/88; pulse 92; respirations 18;  97% saturation on room air.  He is a well-developed, well-nourished  gentleman who appears somewhat tired and in a minimal amount of  discomfort.   He is oriented times 3.  His speech is clear.  His affect is alert.  He  is cooperative and pleasant.  He follows commands without difficulty.   Transitioning from sit to stand is done without any difficulty as well,  although it is a bit slow.  His gait in the room is normal.  Tandem gait  and Romberg's test were performed adequately.  He has full shoulder  range of motion bilaterally.  He is noted as he stands to hold his neck  just  approximately 5 degrees slightly rotated to the right.  He has  limitations with rotation to the left not more than 15 degrees at this  point and about 30 degrees of rotation to the right.  Flexion and  extension are significantly limited as well.   His reflexes are 2+ at the triceps and brachioradialis, 1+ at the biceps  bilaterally, 2+ at the patellar tendons and 0 at the ankles bilaterally.   His motor strength is quite good in both upper and lower extremities.  No focal weakness is appreciated at shoulder abductors biceps, triceps,  brachioradialis, finger flexors are intrinsic.  Lower extremity is  intact as well.   IMPRESSION:  1. Exacerbation of cervicalgia.  2. Status C5-6 and C6-7 cervical fusion by Dr. Kristeen Mans in 2005 and      2006.  3. Limitation in cervical range of motion.  4. Nicotine addiction.  5. Mild depression.   Medical  problems include:  History of coronary artery disease, history  of chronic obstructive pulmonary disease and history of peptic ulcer  disease remotely.   Will start patient on some Prilosec 20 mg q. day.  Will increase his  Ultracet from 2 tablets twice a day up to 2 tablets 3 times a day on a  p.r.n. basis.  This was written for #180.  Will give him some Ambien 10  mg, 1 p.o. q. at bedtime p.r.n. insomnia, #15.   Recommend he use his soft collar over the next several days more than  just a couple hours a day and will obtain flexion and extension cervical  spine views, as well as an AP.  Will see him back in 2 weeks.           ______________________________  Franchot Gallo, M.D.     DMK/MedQ  D:  08/16/2007 09:03:47  T:  08/16/2007 11:33:55  Job #:  [id number]

## 2011-04-12 NOTE — Assessment & Plan Note (Signed)
David Woodard is a 60 year old white gentleman who is being followed in our  pain and rehabilitative clinic today. He is being seen for chronic  cervicalgia. He has a history of fusion x2 with Dr.  Joya Salm at C5-6, C6-  7 in 2005 and 2006.   He is back in today. States his overall pain level has decreased. He has  been on Celebrex for about a month and has been taking Prilosec. He  initially had pain scores in the 9/10 range and today they are down to a  6 or 7.   He had been prescribed up to 6 Ultracet per day. He is taking only about  4, occasionally 5 tablets a day now. His sleep is fair. Pain continues  to be located in the same area on the left posterior cervical region  radiating to the left scapular region and described as sharp and aching.  Worse with activity and improved with medication and heat as well as a  soft cervical collar.   He gets good relief with the current medications at this point. He is  walking about 15 minutes at a time. He is able to climb stairs. He is  independent with his self cares.   PAST MEDICAL HISTORY:  No changes since last visit.   SOCIAL HISTORY:  No changes since last visit.  Continues to smoke about  a pack of cigarettes a day.   FAMILY HISTORY:  No changes since last visit.   Incidentally, he did note that he has been started on an inhaler for his  lungs recently.   CURRENT MEDICATIONS:  Prescribed by this clinic include:  1. Ultracet 1-2 p.o. up to three times a day.  2. He had been on Prilosec this month at 20 mg one p.o. daily.  3. Celebrex one p.o. daily.   PHYSICAL EXAMINATION:  Blood pressure is up slightly at 155/98. Pulse  87. Respiratory rate: 18. He is 98% saturated on room air. He is well-  developed, well-nourished gentleman. Does not appear in any distress. He  is oriented x3. Speech is clear. Affect is bright. He is alert,  cooperative and pleasant. Follows commands without difficulty.   Transitioning from sit-to-stand  easily. Moves without pain behaviors.  Tandem gait, Romberg test are performed adequately. His limitations in  cervical motion especially with rotation to the left. He lacks about 10  degrees compared to right. Full shoulder range of motion is noted. Motor  strength is 5/5 in the upper extremities without focal weakness.  Reflexes are 1+ in the upper extremities and 2+ in the patellar tendons  and 1+ at the Achilles tendons. No abnormal tone is noted. No clonus is  noted.   IMPRESSION:  1. Cervicalgia. Overall improved.  2. Status post C5-6, C6-7 cervical fusion by Dr.  Joya Salm in 2005 and      2006.  3. Nicotine addiction.  4. Mild depression.   David Woodard also has medical problems which include coronary artery  disease, history of chronic obstructive pulmonary disease and history of  peptic ulcer disease remotely.   PLAN:  Will discontinue his Celebrex today. I would like him to monitor  his blood pressure this month and followup with his primary care doctor  if it continues to stay up. He states he understands this and will  comply. He states that he does not need a refill on his Ultracet at this  time. Will hold off. He will call us if he wants a  refill of the  Ultracet. When he calls anticipate filling this medication 1-2 tablets  p.o. t.i.d. p.r.n. neck pain #180. Will see him back in two months  otherwise. He has been stable on these medications and he takes them as  directed. He uses a soft collar appropriately and is able to maintain a  functional lifestyle.           ______________________________  Franchot Gallo, M.D.     DMK/MedQ  D:  08/29/2007 13:16:51  T:  08/29/2007 20:54:53  Job #:  [id number]

## 2011-04-12 NOTE — Assessment & Plan Note (Signed)
David Woodard is a 60 year old white gentleman who has been followed in our  pain and rehabilitative clinic for cervicalgia.   He is status post a cervical fusion with Dr. Joya Woodard at C5-6 and C6-7 in  2005 and 2006.   He is back in today for a refill of his medications.   He states his average pain is between a 6 and an 8 on a scale of 10.  Pain is described as sharp and aching interfering significantly with his  general activities.  Pain is typically worse with walking, bending,  sitting.  Improved with heat and medications.  He has fair to good  relief with the current medications prescribed by our clinic.   Medications from our clinic include Ultracet 1-2 p.o. t.i.d. p.r.n. neck  pain.  He also uses Prilosec on a p.r.n. basis, Ambien on a p.r.n. basis  not more than 15 per month, and Lyrica 50 mg 1 p.o. b.i.d.   He is able to walk at least 10 minutes at a time.  He is able to climb  stairs.  He is currently no driving.  He is independent with his self  care and overall mobility.   No changes regarding his review of systems.  He does complain of some  wheezing and shortness of breath and poor appetite.   He does state incidentally that he had an episode of chest pain about 4-  6 weeks ago which he attributed to taking his Ambien and he has stopped  taking it since then.   PAST MEDICAL, SOCIAL, FAMILY HISTORY:  Unchanged.   PHYSICAL EXAMINATION TODAY:  His blood pressure is 127/81, pulse 95,  respirations 18, 97% saturated on room air.  He is a well-developed,  well-nourished gentleman who does not appear in any distress.  He is  oriented x3.  Speech is clear.  Affect is bright.  He is alert,  cooperative, and pleasant.  He follows commands without difficulty.   Transitioning from sitting to standing is done with ease.  He has  limitations in cervical range of motion and he is lacking approximately  30% of extension as well as 25% of rotation to the left.  He has some  rotation to  the right lacking as well.   Full shoulder range of motion is noted.   Balance is good.  Tandem gait and Romberg test are performed adequately.  Reflexes are otherwise symmetric and intact in the upper extremities.  No abnormal tone is noted.  Motor strength is in the 5/5 range without  focal deficit.   IMPRESSION:  1. Cervicalgia.  Overall doing well functionally.  Last visit was      early October.  He has not had any significant flareups during that      period.  2. Status post C5-6, C6-7 cervical fusion by Dr. Joya Woodard in 2005 and      2006.  3. Nicotine addiction.  4. Mild depression.   PLAN:  David Woodard history is of some recent chest pain.  I have asked  him to follow up with his primary care physician.  I do not think that  the chest pain is related to the one time dose he took of the Ambien  several weeks ago.  He has not taken his Ambien since then; however, he  does have a history of coronary artery disease as well as COPD and a  history of peptic ulcer disease.  I would like him to followup.  I  have  suggested we could make an appointment for him.  However, he has  deferred this and would like to call on his own.   The following medications were prescribed for him today:  Ultracet 1-2  p.o. t.i.d. p.r.n. neck pain, #180, no refills.  He was also given a  prescription for Lyrica 50 mg 1 p.o. b.i.d. #60, 3 refills.   I will see him back in a month.  He takes his medications as prescribed.  He has not exhibited any aberrant behavior.           ______________________________  David Woodard, M.D.     DMK/MedQ  D:  10/29/2007 11:47:25  T:  10/29/2007 12:15:04  Job #:  [id number]

## 2011-04-12 NOTE — Assessment & Plan Note (Signed)
[name] is a pleasant 60 year old gentleman who is followed in our  Pain and Rehabilitative Clinic for chronic cervicalgia.  He states he  has been doing pretty good over the last month.  Average pain is about a  6 on a scale of 10.  Sleep tends to be poor and pain interferes  significantly with activity levels.  He reports fair pain relief with  current meds.   Pain is localized to the posterior cervical region, radiating to the  left scapula occasionally and the right upper trapezius region.   No pain in the arms.  No weakness in the extremities are noted.  No  trouble with walking or balance.   FUNCTIONAL STATUS:  He is able to walk about 10 minutes at a time.  He  is able to climb stairs.  He is not able to drive.  He is independent  with self-care.   REVIEW OF SYSTEMS:  Negative for problems controlling bowel or bladder.  Denies depression, anxiety, or suicidal ideation.  Negative for any  other problems with respect to review of systems.   No changes in past medical, social, or family history since last visit.   MEDICATIONS:  Prescribed through this clinic include;  1. Lidoderm on a p.r.n. basis.  2. Ultracet 1-2 tablets twice a day.  3. Lyrica 50 mg t.i.d.   PHYSICAL EXAMINATION:  VITAL SIGNS:  Blood pressure is 131/78, pulse 94,  respirations 18, and 96% saturated on room air.  GENERAL:  He is a well-developed, well-nourished gentleman, who does not  appear in any distress.  He is oriented x3.  Speech is clear.  Affect is  bright.  He is alert, cooperative, and pleasant.  Follows commands  without any difficulty.  CNS:  Cranial nerves are grossly intact.  Coordination is intact.  Reflexes are 1+ in the upper and lower extremities.  No abnormal tone is  noted.  No clonus is noted.  Sensation is intact.  MUSCULOSKELETAL:  Reveals decreased range of motion especially with  rotation to the left today, lacks about 70% of his range of motion.  He  has full shoulder range of  motion; however, lumbar motion is mildly  limited without pain.   IMPRESSION:  Cervicalgia.  Overall, functionally doing well.  He is  taking Ultracet as well as Lyrica.  Reports there are no problems with  these medications.  Overall, they seem to be helping.  He reports fair  relief with their use.  He takes these medications as prescribed.  No  aberrant behavior has been observed.  He has no new medical problems and  has been stable on his medications.  We will give him a 67-monthsupply.  We will see him back in 3 months.  He will give [place] call if he has any  trouble in the interim.           ______________________________  DFranchot Gallo M.D.     DMK/MedQ  D:  09/03/2008 12:17:05  T:  09/04/2008 02:07:17  Job #:  [id number]

## 2011-04-12 NOTE — Assessment & Plan Note (Signed)
David Woodard is a 60 year old gentleman who is followed in our Pain and  Rehabilitative Clinic for chronic cervicalgia.  He states I have been  doing pretty good in the last month.   He states he purchased a Conservation officer, historic buildings for his neck and  back, which also provides heat.  He got it at [facility],  and he uses it  in his chair and states that it helps quite a bit with his neck pain.   He states his average pain is between a 6 and 7 on a scale of 10,  continues to be localized predominantly to the left posterior cervical  region.  The pain is described as sharp, stabbing, and aching, worse  during the daytime and evening, exacerbated by walking and sitting,  improves with heat.   MEDICATIONS:  He reports fair to good relief with current medications  from this clinic.   FUNCTIONAL STATUS:  He is able to walk at least 15 minutes at a time.  Other functions status includes he is able to climb stairs.  He is not  driving.  He is independent with his self-care, needs assistance with  heavier household tasks.   REVIEW OF SYSTEMS:  Otherwise negative except for shortness of breath.   I have asked him to follow up with his PCP; he does not have one yet.  He states he is in the process of obtaining a PCP.   Past medical, social, and family history are otherwise unchanged.   Medications which are provided through this clinic include Ultracet 1-2  p.o. t.i.d. #180 per month, Lyrica 50 mg t.i.d., and p.r.n. Lidoderm.   PHYSICAL EXAMINATION:  VITAL SIGNS:  Blood pressure is 134/84, pulse 86,  respirations 18, and 96% saturated on room air.  GENERAL:  He is a well-developed, well-nourished gentleman who does not  appear in any distress.  NEUROLOGIC:  He is oriented x3.  Speech is clear.  His affect is bright.  He is alert, cooperative, and pleasant.  He follows commands without  difficulty.  Cranial nerves are grossly intact.  Coordination is intact.  Reflexes are diminished  overall.  Tone is normal.  No tremors or clonus  is noted.  Sensation is unchanged from previous exams.  He is able to  transition easily from sitting to standing.  His balance is good.  Gait  is not antalgic.  Tandem gait and Romberg tests are performed  adequately.  MUSCULOSKELETAL:  Diminished range of motion especially with rotation to  the right, he lacks 70% of his rotation to the right, 20% rotation to  the left, and he lacks 50% of his motion with flexion and extension.  He  has full shoulder range of motion without pain today.   IMPRESSION:  Cervicalgia.  Overall doing well functionally.  He is  tolerating Ultracet well, has no problems with over-sedation or  constipation.  He is taking about 4 tablets a day.   He takes his medications as prescribed.  No aberrant behavior has been  noted.  His pain is fairly well managed, and he remains quite functional  despite some significant orthopedic impairments.  We will see him back  in 2 months.  He is given a 11-monthsupply of his medications.           ______________________________  DFranchot Gallo M.D.     DMK/MedQ  D:  07/11/2008 12:51:48  T:  07/12/2008 04:09:35  Job #:  [id number]

## 2011-04-12 NOTE — Assessment & Plan Note (Signed)
David Woodard is a pleasant 60 year old gentleman, who is followed in our  Pain and Rehabilitative Clinic for chronic cervicalgia.  He was last  seen by me on September 03, 2008.  In the interim, with respect to pain, he  is on about average.  His pain score is about 7 on a scale of 10.  He  finds that the pain medications are so helpful for him.   He does state that he had been ill in December.  He had a fairly severe  bronchitis and was on antibiotics.  He states that the amount of  coughing he had done has made his abdominal muscle sore and did increase  the pain in his neck a bit as well.   Overall, he feels he has recovered quite a bit and is pretty much back  to baseline now.   Pain continues to be localized in the posterior cervical region,  radiating through the left scapular region.  The pain is described as  sharp, stabbing, aching, variable, depending on the day.  Pain is worse  with prolonged activities and walking, bending, sitting, and standing.   He continues to use heat, Lidoderm patches, and medication which all  seem to help his pain.   FUNCTIONAL STATUS:  Independent.   He is able to walk about 40 minutes at a time.  He is able to climb  stairs.  He does not drive.  He is independent with self care including  meal prep.   He denies problems controlling bowel or bladder, problems with dizziness  or trouble walking.  He does report some neck spasms occasionally.  Denies depression, anxiety, or suicidal ideations.   Past medical, social, and family history as noted above.  He does report  that after his bronchitis, he has decreased his cigarette smoking to  about a half a pack of cigarettes per day now.   MEDICATIONS:  Prescribed through our Pain and Rehabilitative Clinic  include:  1. Lidoderm patches on a p.r.n. basis.  2. Ultracet 1-2 tablets twice a day.  3. Lyrica 50 mg 3 times a day.   PHYSICAL EXAMINATION:  VITAL SIGNS:  Blood pressure is 145/86, pulse 90,  respirations 18, and 99% saturated on room air.  GENERAL:  He is a well-developed, well-nourished gentleman, who does not  appear in any distress.   He is oriented x3.  His speech is clear.  Affect is bright.  He is  alert, cooperative, and pleasant.  He follows commands without  difficulty and answers all questions appropriately.   Coordination and cranial nerves are grossly intact.  His reflexes are  intact, 1+ in the upper and lower extremities.   Motor strength is 5/5 without focal deficit in the upper and lower  extremities.   He has limitations in cervical range of motion in all planes.  He has  full shoulder range of motion, however.   IMPRESSION:  Cervicalgia.   David Woodard is overall doing well functionally.  He is not having any  problems with side effects from the Ultracet or the Lyrica or the  Lidoderm.  He is requesting refills on all of these.  I have not noted  any aberrant behavior with him.  He reports overall good function as  well.   Today, we will switch his Ultracet to Ultram to decrease the amount of  acetaminophen he takes in, in a day.  We will put him on Ultram 50 mg 1  p.o. q.i.d.  He had been on Ultracet up to 6 tablets per day.  We will  also refill his Lyrica 50 mg 1 p.o. b.i.d., and we will also give him a  refill on his Lidoderm.  We will see him back in 3 months.  He has been  stable on the above medications and reports overall good relief with the  use.           ______________________________  Franchot Gallo, M.D.     DMK/MedQ  D:  12/03/2008 13:19:55  T:  12/04/2008 05:16:05  Job #:  [id number]

## 2011-04-15 NOTE — H&P (Signed)
David Woodard, ROSTAD NO.:  [id number]   MEDICAL RECORD NO.:  [id number]          PATIENT TYPE:  INP   LOCATION:  2899                         FACILITY:  [facility]   PHYSICIAN:  Leeroy Cha, M.D.   DATE OF BIRTH:  1951-02-27   DATE OF ADMISSION:  11/04/2004  DATE OF DISCHARGE:                                HISTORY & PHYSICAL   HISTORY OF PRESENT ILLNESS:  Mr. Caggiano is a gentleman who underwent fusion  at the level of 5-6, 6-7.  This happened at the beginning of this year, but  nevertheless, he had been complaining of neck pain with radiation to the  shoulder and it is worse with movement.  Because of the findings, we went  ahead and did a workup which included an MRI and CT scan which showed that  there is a lucency at the level of 5-6 in one of the screws and another one  in the second screw.  Because of that, the diagnosis of pseudoarthrosis was  made.  I was able to get him an Designer, multimedia, which he wore for a week and  he had realized that when his neck was immobilized, the pain was better.  Because of that, we are going to fuse him from behind.  He is a heavy smoker  and he was fully advised to stop smoking; so far, he has been unable to.   PAST MEDICAL HISTORY:  1.  Shoulder surgery.  2.  Previous fusion at the level of 5-6 and 6-7.   ALLERGIES:  He is not allergic to any medications.   SOCIAL HISTORY:  He drinks socially.  He smokes +2 to 3 packs a day.   FAMILY HISTORY:  Unremarkable.   REVIEW OF SYSTEMS:  Review of systems is positive for shortness of breath,  some history of back pain and productive cough.   PHYSICAL EXAMINATION:  HEENT:  Head, eyes, ears, nose and throat normal.  NECK:  There is scar anteriorly.  He is able to flex, but extension and  lateralization cause discomfort going to the shoulder.  LUNGS:  Rhonchi bilaterally.  HEART:  Heart sounds normal.  ABDOMEN:  Normal.  EXTREMITIES:  Normal pulses.  NEUROLOGIC:  Mental status  normal.  Cranial nerves normal.  Strength normal.  Sensation normal.  Reflexes normal.   CLINICAL IMPRESSION:  1.  C5-C6, C6-C7 pseudoarthrosis.  2.  Chronic bronchitis secondary to history of heavy smoking.   RECOMMENDATIONS:  The patient is being admitted for a posterior 5-6, 6-7  fusion with lateral mass screws and BMP.  He knows about the risks such as  paralysis, no improvement whatsoever, need for further surgery, failure for  him to fuse, phlebitis and all the risks associated with the smoking.       EB/MEDQ  D:  11/03/2004  T:  11/03/2004  Job:  [id number]

## 2011-04-15 NOTE — Cardiovascular Report (Signed)
. [facility]  Patient:    KHALIQ, TURAY Visit Number: [id number] MRN: [id number]          Service Type: MED Location: [id number] Attending Physician:  Jenne Campus Dictated by:   Ramond Dial., M.D. Proc. Date: 12/03/01 Admit Date:  12/01/2001   CC:         Cardiac Catheterization Lab   Cardiac Catheterization  HISTORY:  Mr. Matranga is a 60 year old gentleman with a history of cigarette smoking.  He was admitted with episodes of chest pain.  He is scheduled for heart catheterization for further evaluation.  CARDIOLOGIST:  Ramond Dial., M.D.  PROCEDURE:  Left heart catheterization with coronary angiography.  PROCEDURE DESCRIPTION:  The right femoral artery was easily cannulated using a modified Seldinger technique.  HEMODYNAMICS:  The left ventricular pressure was 103/13 with an aortic pressure was 103/69.  ANGIOGRAPHY:  The left main coronary artery smooth and is essentially normal.  The left anterior descending artery is a moderate to large vessel.  There are some minor luminal irregularities, but otherwise the LAD is normal.  It is somewhat tortuous.  There is moderate size diagonal branch which is normal.  The left circumflex artery is a moderate size vessel and is normal.  The right coronary artery is moderate to large and is dominant.  There are minor luminal irregularities.  There is a proximal stenosis of approximately 20%, but this is not flow restricting.  LEFT VENTRICULOGRAM:  A left ventriculogram was performed in the 30 RAO position.  It reveals normal left ventricular systolic function with and ejection fraction of 65%.  There are no segmental wall motion abnormalities. There is no mitral regurgitation.  COMPLICATIONS:  None.  CONCLUSIONS:  Essentially normal coronary arteries.  He has a few minor luminal irregularities but no lesions that would be considered obstructive. He will be able  to be discharged soon. Dictated by:   Ramond Dial., M.D. Attending Physician:  Jenne Campus DD:  12/03/01 TD:  12/03/01 Job: [id number]

## 2011-04-15 NOTE — Group Therapy Note (Signed)
David Woodard is kindly referred by Dr. Leeroy Cha for pain management of  his chronic cervicalgia.   David Woodard is a 60 year old gentleman who is accompanied by his girlfriend  today to our pain and rehabilitative clinic.  He states that he has  undergone two cervical spine surgeries, both by Dr. Joya Salm, one in 2005.  He  had an anterior fusion at C5-C6, C6-C7 and then in 2006 underwent a  posterior instrumentation apparently at C5-C6, C6-C7.  I have a CT scan of  his cervical spine, dated December 05, 2005, which showed solid fusion at C5  to C7.  No significant stenosis above or below the fusion site.  Hardware  intact.  A small central protrusion at C3-C4 without neural encroachment.  This study was ready by Dr. Rolla Flatten.   David Woodard states his pain is about on average an 8 on a scale of 10, worst  in the morning and towards the end of the day.  He reports he does not sleep  very well at all.  The pain is exacerbated by a variety of activities, it  improves with rest and heat.  He gets some relief with the use of Aleve or  Advil which he takes once or twice a day.  He states he does not get much  relief with Vicodin or Tylox.   He reports he is able to walk about 10 minutes at a time.  He is able to  climb stairs.  He currently does not drive.  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  Currently, he spends most of his day watching TV, taking care of some  animals which he has at home, sleeping.  He is independent with his self  care, meal prep, needs some assistance with shopping and household duties.   REVIEW OF SYSTEMS:  Health and history form positive review of systems for  trouble walking, spasm, dizziness, depression.  Denies suicidal ideation  however.  Also positive for wheezing, coughing, poor appetite shortness of  breath.   PAST MEDICAL HISTORY:  1.  Positive for history of ulcers about 30 years ago.  2.  History of  possible heart disease, status post angioplasty.  3.  MI in 2002.   PAST SURGICAL HISTORY:  1.  Positive for right elbow surgery, 1996.  2.  Right shoulder surgery.  3.  Anterior fusion in 2005, C5-C6, C6-C7, Dr. Joya Salm.  4.  Posterior fusion approach C5-C6 and C6-C7, 2006, Dr. Joya Salm.  No op      notes regarding this particular procedure however.   SOCIAL HISTORY:  The patient is divorced, lives with his girlfriend and her  27 year old son.  He has had a DUI back in 1991.  He does admit to drinking  two beers a week.  His girlfriend states he drinks up to every day on some  occasions.  Also, admits to smoking a pack and a half of cigarettes a day  for 40 years.   FAMILY HISTORY:  Positive for heart disease, alcohol abuse.   PHYSICAL EXAMINATION:  VITAL SIGNS:  Today, blood pressure 132/73, pulse 81,  respirations 16, 97% saturated on room air.  GENERAL:  He is a thin adult gentleman, does not appear in any distress.  He  is oriented x3.  His affect is bright, alert.  He is cooperative.  He is  pleasant.  MUSCULOSKELETAL:  He transitions from a sit-to-stand without difficulty.  During  his gait cycle, he displays good balance, normal gait overall.  Tandem gait is without much difficulty.  Romberg test is negative.  Range of  motion in the shoulders is within normal limits.  He has limitations in  cervical range of motion especially with rotation to the right and right  lateral flexion.  NEUROLOGIC:  His reflexes are evaluated.  There is 1+ in the upper  extremities and symmetric, 1+ at the patellar tendons, 0 at the Achilles  tendons bilaterally.  He reports diminished sensation over the left anterior  tibial area slightly compared to right.  Otherwise intact throughout both  upper extremities.  Motor strength is good in both upper and lower  extremities.  No focal weakness is noted.   IMPRESSION:  1.  Status post C5-C6, C6-C7 fusion, Dr. Joya Salm, 2005 and 2006.  2.   Cervicalgia.  3.  Nicotine addiction.  4.  Insomnia.  5.  History of ETOH use.  6.  Mild depression.  7.  Also appears to have a history of heart disease, possibly chronic      obstructive pulmonary disease.  8.  History of ulcer disease, remotely.   The patient has not been followed regularly by a primary care physician.  On  health and history form he does admit to some shortness of breath.  He is a  smoker.  He also has had a heart attack back in 2002.   1.  I strongly advised the patient to follow up with a primary care      physician for other medical problems.  2.  I would like to check LFTs and basic metabolic package.  Check renal      function as well.  3.  We have reviewed options for him regarding his cervicalgia.  Topical      agents and TENS unit were reviewed.  He has not tried Lidoderm.  He says      he can not afford a TENS unit or physical therapy.  We also discussed      use of medications.  At this point, would like to check liver function      and basic metabolic panel prior to providing medications.  4.  We will trial him this month on some Neurontin 300 to 600 mg at night,      #45, with two refills.  5.  We will trial him on some Lidoderm, samples were provided today.      Nursing staff reviewed the use of this medication with him as well.  6.  We discussed other medications to help with the cervicalgia, considering      Ultracet at this point in addition to some of the nonsteroidals he is      using.  I reviewed how to use nonsteroidals appropriately in addition to      Tylenol today.  7.  Also discussed the possibility of doing medial branch blocks with him.      He believes this may be cost prohibitive for him however.  8.  At this point, we will just check his urine drug screen, LFTs, basic      metabolic package.  9.  We will see him back in a month and consider Ultracet next month.          ______________________________  Franchot Gallo,  M.D.     DMK/MedQ  D:  04/07/2006 13:31:22  T:  04/08/2006 15:24:52  Job #:  [id number]   cc:  Leeroy Cha, M.D.  Fax: [phone]

## 2011-04-15 NOTE — Op Note (Signed)
David Woodard, David Woodard NO.:  [id number]   MEDICAL RECORD NO.:  [id number]          PATIENT TYPE:  INP   LOCATION:  FOOT                         FACILITY:  [facility]   PHYSICIAN:  Leeroy Cha, M.D.   DATE OF BIRTH:  21-Jun-1951   DATE OF PROCEDURE:  11/03/2004  DATE OF DISCHARGE:                                 OPERATIVE REPORT   DATE OF OPERATION:  November 03, 2004.   PREOPERATIVE DIAGNOSES:  1.  C5-C6, C6-C7 cervical arthrosis.  2.  Chronic neck pain.   POSTOPERATIVE DIAGNOSES:  1.  C5-C6, C6-C7 cervical arthrosis.  2.  Chronic neck pain.   PROCEDURE:  1.  Posterior C5-C6-C7 lateral mass and screws.  2.  Fusion with a BMP.   SURGEON:  Leeroy Cha, MD.   ASSISTANT:  Earleen Newport, MD.   CLINICAL HISTORY:  The patient was admitted because of neck pain.  This  gentleman almost a year ago underwent surgery at the levels of C4-5 and C5-  6.  Although he had no weakness in the neck and the arms, he had the  complaint of neck pain, which gets worse with movement.  We immobilized him  with a cervical collar, and the pain went away.  X-rays showed the  possibility of cervical arthrosis at the levels of C5-6 and C6-7.  Because  of that, surgery was advised through a posterior approach.   PROCEDURE:  The patient was taken to the OR and after intubation he was  positioned in a prone manner using a 3-piece head holder.  The neck was  painted with Betadine.  A midline incision from C2-3 down to C6-C7 was made.  Muscles were retracted laterally.  After we had a good retraction, we were  able to see the lateral aspect of the facet.  Patient had a little bit of  scoliosis without hyper__________ of the facet.  With the fluoroscopy, we  identified the mid point at the facet of C5, C6, and C7.  In some of them,  we introduced a drill.  We used a 25-45 entrance point and the other one 30-  30.  The reason for this procedure was because of the situation of the  facet.   This was done with the help of the fluoroscopy and we were able to  introduce the screws about 10 mm at the level of C5, C6, and C7 lateral  mass.  Then, this procedure was done bilaterally.  Then, the cartilage of C5-  6 and C6-7 facet was removed.  Then, a BMP was introduced in the two facets  bilaterally.  Then, a rod was applied to the screws and a cap was used  to secure that in place.  Then, we went ahead and we drilled the surface of  the facet as well as the lamina and the spinous process, and the rest of the  BMP was applied in this area.  The lateral cervical spine showed good  position of the screws.  From there on, the area was irrigated and the wound  was closed with Vicryl and  Steri-Strips.        ___________________________________________  Leeroy Cha, M.D.    EB/MEDQ  D:  11/03/2004  T:  11/03/2004  Job:  [id number]

## 2011-08-06 ENCOUNTER — Encounter: Payer: Self-pay | Admitting: Emergency Medicine

## 2011-08-06 ENCOUNTER — Inpatient Hospital Stay (INDEPENDENT_AMBULATORY_CARE_PROVIDER_SITE_OTHER)
Admission: RE | Admit: 2011-08-06 | Discharge: 2011-08-06 | Disposition: A | Discharge status: Home / Self Care | Payer: Medicare Other | Source: Ambulatory Visit | Attending: Emergency Medicine | Admitting: Emergency Medicine

## 2011-08-06 DIAGNOSIS — IMO0002 Reserved for concepts with insufficient information to code with codable children: Secondary | ICD-10-CM

## 2011-10-31 NOTE — Progress Notes (Signed)
Summary: insect bite?/TM(RM4)   Vital Signs:  Patient Profile:   60 Years Old Male CC:      INSECT BITE Height:     70 inches Weight:      210.50 pounds O2 Sat:      95 % O2 treatment:    Room Air Temp:     98.3 degrees F oral Pulse rate:   88 / minute Resp:     18 per minute BP sitting:   134 / 92  (left arm) Cuff size:   regular  Vitals Entered By: Earley Favor RN (August 06, 2011 12:26 PM)                  Updated Prior Medication List: ASPIR-LOW 81 MG TBEC (ASPIRIN) 1 tab by mouth once daily  Current Allergies (reviewed today): No known allergies History of Present Illness Chief Complaint: INSECT BITE History of Present Illness: Insect bite on R forearm about 4-5 days ago.  ?spider bite.  It swelled up quickly and then started to drain.  It is still sore, but he feels it is much less painful and smaller.  He was encouraged to have it checked out by his wife.  No F/C/N/V.   REVIEW OF SYSTEMS Constitutional Symptoms      Denies fever, chills, night sweats, weight loss, weight gain, and fatigue.  Eyes       Denies change in vision, eye pain, eye discharge, glasses, contact lenses, and eye surgery. Ear/Nose/Throat/Mouth       Denies hearing loss/aids, change in hearing, ear pain, ear discharge, dizziness, frequent runny nose, frequent nose bleeds, sinus problems, sore throat, hoarseness, and tooth pain or bleeding.  Respiratory       Denies dry cough, productive cough, wheezing, shortness of breath, asthma, bronchitis, and emphysema/COPD.  Cardiovascular       Denies murmurs, chest pain, and tires easily with exhertion.    Gastrointestinal       Denies stomach pain, nausea/vomiting, diarrhea, constipation, blood in bowel movements, and indigestion. Genitourniary       Denies painful urination, kidney stones, and loss of urinary control. Neurological       Denies paralysis, seizures, and fainting/blackouts. Musculoskeletal       Complains of redness and  swelling.      Denies muscle pain, joint pain, joint stiffness, decreased range of motion, muscle weakness, and gout.  Skin       Denies bruising, unusual mles/lumps or sores, and hair/skin or nail changes.  Psych       Denies mood changes, temper/anger issues, anxiety/stress, speech problems, depression, and sleep problems. Other Comments: BITTEN X5 DAYS AGO   Past History:  Past Medical History: Reviewed history from 11/11/2010 and no changes required. HEART ATTACK, HX OF (ICD-410.90)   -2003  BRONCHITIS, ACUTE (ICD-466.0) COUGH   Past Surgical History: Reviewed history from 11/11/2010 and no changes required. Neck surgery x 2   Family History: Reviewed history from 11/11/2010 and no changes required. Mother, D, intestional blockage Father, D, heart disease  Social History: Reviewed history from 11/11/2010 and no changes required. Single Disabled,  former Public relations account executive  Current Smoker - 1/2 -1 pack daily, started at age 68 Alcohol use-yes - 5 drinks weekly Drug use-no Regular exercise-no 2 children Physical Exam General appearance: well developed, well nourished, no acute distress MSE: oriented to time, place, and person R dorsal mid-forearm with an abscess approx 1.5cm in diameter with mild induration, no fluctuance with erythema surrounding  it approx 2cm in either direction.  It is tender to palpation, no drainage expressed.  Distal NV status is intact.  FROM of shoulder, elbow, and wrist. Assessment New Problems: ABSCESS, ARM (ICD-682.3)   Patient Education: Patient and/or caregiver instructed in the following: Ibuprofen prn.  Plan New Medications/Changes: DOXYCYCLINE HYCLATE 100 MG CAPS (DOXYCYCLINE HYCLATE) 1 by mouth two times a day for 10 days  #20 x 0, 08/06/2011, Fidela Salisbury MD  New Orders: Est. Patient Level III [id number] Planning Comments:   Since it's already draining and improving, will first place on Rx for Doxy.  Likely staph  infection with minimal cellulitis.  Will hold off on I&D for now, and only do if worsening again or just not improving.  Warm compresses and not squeezing it encouraged.  Follow-up with your primary care physician if not improving or if getting worse   The patient and/or caregiver has been counseled thoroughly with regard to medications prescribed including dosage, schedule, interactions, rationale for use, and possible side effects and they verbalize understanding.  Diagnoses and expected course of recovery discussed and will return if not improved as expected or if the condition worsens. Patient and/or caregiver verbalized understanding.  Prescriptions: DOXYCYCLINE HYCLATE 100 MG CAPS (DOXYCYCLINE HYCLATE) 1 by mouth two times a day for 10 days  #20 x 0   Entered and Authorized by:   Fidela Salisbury MD   Signed by:   Fidela Salisbury MD on 08/06/2011   Method used:   Print then Give to Patient   RxID:   [id number]   Orders Added: 1)  Est. Patient Level III [40352]

## 2012-12-31 ENCOUNTER — Encounter: Payer: Self-pay | Admitting: *Deleted

## 2012-12-31 ENCOUNTER — Emergency Department (INDEPENDENT_AMBULATORY_CARE_PROVIDER_SITE_OTHER)
Admission: EM | Admit: 2012-12-31 | Discharge: 2012-12-31 | Disposition: A | Discharge status: Home / Self Care | Payer: Medicare Other | Source: Home / Self Care | Attending: Family Medicine | Admitting: Family Medicine

## 2012-12-31 ENCOUNTER — Emergency Department (INDEPENDENT_AMBULATORY_CARE_PROVIDER_SITE_OTHER): Payer: Medicare Other

## 2012-12-31 DIAGNOSIS — J209 Acute bronchitis, unspecified: Secondary | ICD-10-CM | POA: Diagnosis not present

## 2012-12-31 DIAGNOSIS — R05 Cough: Secondary | ICD-10-CM

## 2012-12-31 DIAGNOSIS — J988 Other specified respiratory disorders: Secondary | ICD-10-CM | POA: Diagnosis not present

## 2012-12-31 DIAGNOSIS — J398 Other specified diseases of upper respiratory tract: Secondary | ICD-10-CM | POA: Diagnosis not present

## 2012-12-31 DIAGNOSIS — J449 Chronic obstructive pulmonary disease, unspecified: Secondary | ICD-10-CM

## 2012-12-31 HISTORY — DX: Acute myocardial infarction, unspecified: I21.9

## 2012-12-31 MED ORDER — DOXYCYCLINE HYCLATE 100 MG PO CAPS: 100.0000 mg | ORAL_CAPSULE | Freq: Two times a day (BID) | ORAL | Status: DC

## 2012-12-31 MED ORDER — PREDNISONE 20 MG PO TABS: 20.0000 mg | ORAL_TABLET | Freq: Two times a day (BID) | ORAL | Status: DC

## 2012-12-31 MED ORDER — BENZONATATE 200 MG PO CAPS: 200.0000 mg | ORAL_CAPSULE | Freq: Every day | ORAL | Status: DC

## 2012-12-31 NOTE — ED Provider Notes (Signed)
History     CSN: [id number]  Arrival date & time 12/31/12  1117   First MD Initiated Contact with Patient 12/31/12 1129      Chief Complaint  Patient presents with  . Cough  . URI      HPI Comments: Patient developed URI symptoms two weeks ago but did not feel ill initially.  His cough has gradually worsened, and he has developed increased wheezing and shortness of breath with activity.  No pleuritic pain.  Yesterday he had a fever, and over past four days has had sensation of dry mouth.  He noted some improvement when he took an albuterol nebulizer treatment.  He continues to smoke.  Past history of pneumonia.  The history is provided by the patient and the spouse.    Past Medical History  Diagnosis Date  . MI (myocardial infarction)     Past Surgical History  Procedure Date  . Coronary angioplasty with stent placement     Family History  Problem Relation Age of Onset  . Hypertension Father   . Heart disease Father     History  Substance Use Topics  . Smoking status: Current Every Day Smoker -- 1.5 packs/day    Types: Cigarettes  . Smokeless tobacco: Never Used  . Alcohol Use: Yes      Review of Systems + sore throat + cough No pleuritic pain + wheezing + nasal congestion ? post-nasal drainage No sinus pain/pressure No itchy/red eyes No earache No hemoptysis + SOB + fever yesterday, + chills No nausea No vomiting + abdominal pain with coughing No diarrhea No urinary symptoms No skin rashes + fatigue No myalgias + headache   Allergies  Review of patient's allergies indicates no known allergies.  Home Medications   Current Outpatient Rx  Name  Route  Sig  Dispense  Refill  . ASPIRIN 81 MG PO TABS   Oral   Take 81 mg by mouth daily.         Marland Kitchen BENZONATATE 200 MG PO CAPS   Oral   Take 1 capsule (200 mg total) by mouth at bedtime. Take as needed for cough   12 capsule   0   . DOXYCYCLINE HYCLATE 100 MG PO CAPS   Oral   Take 1 capsule  (100 mg total) by mouth 2 (two) times daily.   20 capsule   0   . PREDNISONE 20 MG PO TABS   Oral   Take 1 tablet (20 mg total) by mouth 2 (two) times daily.   10 tablet   0     BP 154/92  Pulse 94  Temp 98.3 F (36.8 C) (Oral)  Resp 18  Ht 5' 10"  (1.778 m)  Wt 210 lb (95.255 kg)  BMI 30.13 kg/m2  SpO2 97%  Physical Exam Nursing notes and Vital Signs reviewed. Appearance:  Patient appears stated age, and in no acute distress Eyes:  Pupils are equal, round, and reactive to light and accomodation.  Extraocular movement is intact.  Conjunctivae are not inflamed  Ears:  Canals normal.  Tympanic membranes normal.  Nose:  Mildly congested turbinates.  No sinus tenderness.  Pharynx:  Moist mucous membranes  Neck:  Supple.  No adenopathy Lungs:   Scattered rhonchi, no rales or wheezes.  Breath sounds are equal.  Heart:  Regular rate and rhythm without murmurs, rubs, or gallops.  Abdomen:  Nontender without masses or hepatosplenomegaly.  Bowel sounds are present.  No CVA or flank tenderness.  Extremities:  No edema.  No calf tenderness Skin:  No rash present.   ED Course  Procedures none   Dg Chest 2 View  12/31/2012  *RADIOLOGY REPORT*  Clinical Data: Persistent cough for 2 weeks, history of COPD  CHEST - 2 VIEW  Comparison: Chest x-ray of 10/31/2010  Findings: No active infiltrate or effusion is seen.  The lungs remain somewhat hyperaerated.  There is peribronchial thickening noted which may indicate bronchitis.  The heart is within upper limits of normal and stable.  There are degenerative changes in the lower thoracic spine.  A lower anterior cervical spine fusion plate is present.  IMPRESSION: No pneumonia.  Peribronchial thickening may indicate bronchitis. Slight hyperaeration.   Original Report Authenticated By: Ivar Drape, M.D.      1. Acute bronchitis       MDM  Begin doxycycline and prednisone burst.  Prescription written for Benzonatate (Tessalon) to take at bedtime  for night-time cough.  Take plain Mucinex (guaifenesin) twice daily for cough and congestion.  Increase fluid intake, rest. May use Afrin nasal spray (or generic oxymetazoline) twice daily for about 5 days.  Also recommend using saline nasal spray several times daily and saline nasal irrigation (AYR is a common brand) Stop all antihistamines for now, and other non-prescription cough/cold preparations. Continue albuterol inhaler. Followup with family doctor for elevated blood pressure Follow-up with family doctor if not improving 7 to 10 days.        Kandra Nicolas, MD 12/31/12 1308

## 2012-12-31 NOTE — ED Notes (Signed)
[place] c/o productive cough, congestion, body aches, fatigue x 2 weeks.

## 2013-01-02 ENCOUNTER — Telehealth: Payer: Self-pay | Admitting: Emergency Medicine

## 2014-12-25 DIAGNOSIS — K561 Intussusception: Secondary | ICD-10-CM | POA: Diagnosis not present

## 2014-12-25 DIAGNOSIS — F17210 Nicotine dependence, cigarettes, uncomplicated: Secondary | ICD-10-CM | POA: Diagnosis not present

## 2014-12-25 DIAGNOSIS — I252 Old myocardial infarction: Secondary | ICD-10-CM | POA: Diagnosis not present

## 2014-12-25 DIAGNOSIS — K5730 Diverticulosis of large intestine without perforation or abscess without bleeding: Secondary | ICD-10-CM | POA: Diagnosis not present

## 2014-12-25 DIAGNOSIS — R1010 Upper abdominal pain, unspecified: Secondary | ICD-10-CM | POA: Diagnosis not present

## 2014-12-25 DIAGNOSIS — Z7982 Long term (current) use of aspirin: Secondary | ICD-10-CM | POA: Diagnosis not present

## 2014-12-25 DIAGNOSIS — R1084 Generalized abdominal pain: Secondary | ICD-10-CM | POA: Diagnosis not present

## 2014-12-25 DIAGNOSIS — K8020 Calculus of gallbladder without cholecystitis without obstruction: Secondary | ICD-10-CM | POA: Diagnosis not present

## 2014-12-26 DIAGNOSIS — K429 Umbilical hernia without obstruction or gangrene: Secondary | ICD-10-CM | POA: Diagnosis not present

## 2014-12-26 DIAGNOSIS — K561 Intussusception: Secondary | ICD-10-CM | POA: Diagnosis not present

## 2014-12-30 DIAGNOSIS — K561 Intussusception: Secondary | ICD-10-CM | POA: Diagnosis not present

## 2015-01-05 DIAGNOSIS — K561 Intussusception: Secondary | ICD-10-CM | POA: Diagnosis not present

## 2015-01-07 DIAGNOSIS — K625 Hemorrhage of anus and rectum: Secondary | ICD-10-CM | POA: Diagnosis not present

## 2015-01-07 DIAGNOSIS — R1084 Generalized abdominal pain: Secondary | ICD-10-CM | POA: Diagnosis not present

## 2015-01-07 DIAGNOSIS — R197 Diarrhea, unspecified: Secondary | ICD-10-CM | POA: Diagnosis not present

## 2015-01-08 DIAGNOSIS — R197 Diarrhea, unspecified: Secondary | ICD-10-CM | POA: Diagnosis not present

## 2015-01-08 DIAGNOSIS — R1031 Right lower quadrant pain: Secondary | ICD-10-CM | POA: Diagnosis not present

## 2015-01-14 DIAGNOSIS — D123 Benign neoplasm of transverse colon: Secondary | ICD-10-CM | POA: Diagnosis not present

## 2015-01-14 DIAGNOSIS — K621 Rectal polyp: Secondary | ICD-10-CM | POA: Diagnosis not present

## 2015-01-14 DIAGNOSIS — R197 Diarrhea, unspecified: Secondary | ICD-10-CM | POA: Diagnosis not present

## 2015-01-14 DIAGNOSIS — R1031 Right lower quadrant pain: Secondary | ICD-10-CM | POA: Diagnosis not present

## 2015-01-14 LAB — HM COLONOSCOPY

## 2015-02-13 DIAGNOSIS — R1084 Generalized abdominal pain: Secondary | ICD-10-CM | POA: Diagnosis not present

## 2015-03-16 DIAGNOSIS — F17210 Nicotine dependence, cigarettes, uncomplicated: Secondary | ICD-10-CM | POA: Diagnosis not present

## 2015-03-16 DIAGNOSIS — Z7982 Long term (current) use of aspirin: Secondary | ICD-10-CM | POA: Diagnosis not present

## 2015-03-16 DIAGNOSIS — J449 Chronic obstructive pulmonary disease, unspecified: Secondary | ICD-10-CM | POA: Diagnosis not present

## 2015-03-16 DIAGNOSIS — J984 Other disorders of lung: Secondary | ICD-10-CM | POA: Diagnosis not present

## 2015-03-16 DIAGNOSIS — R61 Generalized hyperhidrosis: Secondary | ICD-10-CM | POA: Diagnosis not present

## 2015-03-16 DIAGNOSIS — I252 Old myocardial infarction: Secondary | ICD-10-CM | POA: Diagnosis not present

## 2015-03-16 DIAGNOSIS — Z8719 Personal history of other diseases of the digestive system: Secondary | ICD-10-CM | POA: Insufficient documentation

## 2015-03-16 DIAGNOSIS — I491 Atrial premature depolarization: Secondary | ICD-10-CM | POA: Diagnosis not present

## 2015-03-16 DIAGNOSIS — G588 Other specified mononeuropathies: Secondary | ICD-10-CM | POA: Diagnosis not present

## 2015-03-16 DIAGNOSIS — R918 Other nonspecific abnormal finding of lung field: Secondary | ICD-10-CM | POA: Diagnosis not present

## 2015-03-16 DIAGNOSIS — R0789 Other chest pain: Secondary | ICD-10-CM | POA: Diagnosis not present

## 2015-03-16 DIAGNOSIS — R079 Chest pain, unspecified: Secondary | ICD-10-CM | POA: Diagnosis not present

## 2015-03-17 DIAGNOSIS — F17210 Nicotine dependence, cigarettes, uncomplicated: Secondary | ICD-10-CM | POA: Insufficient documentation

## 2015-03-17 DIAGNOSIS — I519 Heart disease, unspecified: Secondary | ICD-10-CM | POA: Diagnosis not present

## 2015-03-17 DIAGNOSIS — R079 Chest pain, unspecified: Secondary | ICD-10-CM | POA: Diagnosis not present

## 2015-03-30 DIAGNOSIS — J449 Chronic obstructive pulmonary disease, unspecified: Secondary | ICD-10-CM | POA: Diagnosis not present

## 2015-03-30 DIAGNOSIS — Z955 Presence of coronary angioplasty implant and graft: Secondary | ICD-10-CM | POA: Insufficient documentation

## 2015-03-30 DIAGNOSIS — I2510 Atherosclerotic heart disease of native coronary artery without angina pectoris: Secondary | ICD-10-CM | POA: Diagnosis not present

## 2015-03-30 DIAGNOSIS — R072 Precordial pain: Secondary | ICD-10-CM | POA: Diagnosis not present

## 2015-04-24 DIAGNOSIS — J449 Chronic obstructive pulmonary disease, unspecified: Secondary | ICD-10-CM | POA: Diagnosis not present

## 2015-04-24 DIAGNOSIS — Z955 Presence of coronary angioplasty implant and graft: Secondary | ICD-10-CM | POA: Diagnosis not present

## 2015-04-24 DIAGNOSIS — I2510 Atherosclerotic heart disease of native coronary artery without angina pectoris: Secondary | ICD-10-CM | POA: Diagnosis not present

## 2015-04-24 DIAGNOSIS — R0781 Pleurodynia: Secondary | ICD-10-CM | POA: Diagnosis not present

## 2015-04-24 DIAGNOSIS — L538 Other specified erythematous conditions: Secondary | ICD-10-CM | POA: Diagnosis not present

## 2015-04-24 DIAGNOSIS — Z7189 Other specified counseling: Secondary | ICD-10-CM | POA: Diagnosis not present

## 2015-04-24 DIAGNOSIS — F17200 Nicotine dependence, unspecified, uncomplicated: Secondary | ICD-10-CM | POA: Diagnosis not present

## 2015-04-29 DIAGNOSIS — J449 Chronic obstructive pulmonary disease, unspecified: Secondary | ICD-10-CM | POA: Diagnosis not present

## 2015-04-29 DIAGNOSIS — Z955 Presence of coronary angioplasty implant and graft: Secondary | ICD-10-CM | POA: Diagnosis not present

## 2015-04-29 DIAGNOSIS — R072 Precordial pain: Secondary | ICD-10-CM | POA: Diagnosis not present

## 2015-04-29 DIAGNOSIS — R079 Chest pain, unspecified: Secondary | ICD-10-CM | POA: Diagnosis not present

## 2015-04-29 DIAGNOSIS — I2510 Atherosclerotic heart disease of native coronary artery without angina pectoris: Secondary | ICD-10-CM | POA: Diagnosis not present

## 2015-06-05 DIAGNOSIS — I2510 Atherosclerotic heart disease of native coronary artery without angina pectoris: Secondary | ICD-10-CM | POA: Diagnosis not present

## 2015-06-05 DIAGNOSIS — F17200 Nicotine dependence, unspecified, uncomplicated: Secondary | ICD-10-CM | POA: Diagnosis not present

## 2015-06-05 DIAGNOSIS — J449 Chronic obstructive pulmonary disease, unspecified: Secondary | ICD-10-CM | POA: Diagnosis not present

## 2015-07-16 DIAGNOSIS — R1010 Upper abdominal pain, unspecified: Secondary | ICD-10-CM | POA: Diagnosis not present

## 2015-07-16 DIAGNOSIS — Z8719 Personal history of other diseases of the digestive system: Secondary | ICD-10-CM | POA: Diagnosis not present

## 2015-07-16 DIAGNOSIS — R197 Diarrhea, unspecified: Secondary | ICD-10-CM | POA: Diagnosis not present

## 2015-07-16 DIAGNOSIS — I2510 Atherosclerotic heart disease of native coronary artery without angina pectoris: Secondary | ICD-10-CM | POA: Diagnosis not present

## 2015-07-17 DIAGNOSIS — K5790 Diverticulosis of intestine, part unspecified, without perforation or abscess without bleeding: Secondary | ICD-10-CM | POA: Diagnosis not present

## 2015-07-17 DIAGNOSIS — K5000 Crohn's disease of small intestine without complications: Secondary | ICD-10-CM | POA: Diagnosis not present

## 2015-07-17 DIAGNOSIS — K5730 Diverticulosis of large intestine without perforation or abscess without bleeding: Secondary | ICD-10-CM | POA: Diagnosis not present

## 2015-07-17 DIAGNOSIS — K8020 Calculus of gallbladder without cholecystitis without obstruction: Secondary | ICD-10-CM | POA: Diagnosis not present

## 2015-07-24 DIAGNOSIS — I2510 Atherosclerotic heart disease of native coronary artery without angina pectoris: Secondary | ICD-10-CM | POA: Diagnosis not present

## 2015-07-24 DIAGNOSIS — R109 Unspecified abdominal pain: Secondary | ICD-10-CM | POA: Diagnosis not present

## 2015-07-29 DIAGNOSIS — R109 Unspecified abdominal pain: Secondary | ICD-10-CM | POA: Diagnosis not present

## 2015-07-29 DIAGNOSIS — K5730 Diverticulosis of large intestine without perforation or abscess without bleeding: Secondary | ICD-10-CM | POA: Diagnosis not present

## 2015-07-29 DIAGNOSIS — K8020 Calculus of gallbladder without cholecystitis without obstruction: Secondary | ICD-10-CM | POA: Diagnosis not present

## 2015-07-29 DIAGNOSIS — R933 Abnormal findings on diagnostic imaging of other parts of digestive tract: Secondary | ICD-10-CM | POA: Diagnosis not present

## 2015-08-10 DIAGNOSIS — R197 Diarrhea, unspecified: Secondary | ICD-10-CM | POA: Diagnosis not present

## 2015-08-10 DIAGNOSIS — R1011 Right upper quadrant pain: Secondary | ICD-10-CM | POA: Diagnosis not present

## 2015-09-14 DIAGNOSIS — R938 Abnormal findings on diagnostic imaging of other specified body structures: Secondary | ICD-10-CM | POA: Diagnosis not present

## 2015-09-14 DIAGNOSIS — R197 Diarrhea, unspecified: Secondary | ICD-10-CM | POA: Diagnosis not present

## 2015-09-15 DIAGNOSIS — K5000 Crohn's disease of small intestine without complications: Secondary | ICD-10-CM

## 2015-09-15 HISTORY — DX: Crohn's disease of small intestine without complications: K50.00

## 2015-09-25 DIAGNOSIS — Z823 Family history of stroke: Secondary | ICD-10-CM | POA: Diagnosis not present

## 2015-09-25 DIAGNOSIS — Z7982 Long term (current) use of aspirin: Secondary | ICD-10-CM | POA: Diagnosis not present

## 2015-09-25 DIAGNOSIS — Z981 Arthrodesis status: Secondary | ICD-10-CM | POA: Diagnosis not present

## 2015-09-25 DIAGNOSIS — K8020 Calculus of gallbladder without cholecystitis without obstruction: Secondary | ICD-10-CM | POA: Diagnosis not present

## 2015-09-25 DIAGNOSIS — K8010 Calculus of gallbladder with chronic cholecystitis without obstruction: Secondary | ICD-10-CM | POA: Diagnosis not present

## 2015-09-25 DIAGNOSIS — R1011 Right upper quadrant pain: Secondary | ICD-10-CM | POA: Diagnosis not present

## 2015-09-25 DIAGNOSIS — Z86010 Personal history of colonic polyps: Secondary | ICD-10-CM | POA: Diagnosis not present

## 2015-09-25 DIAGNOSIS — F17210 Nicotine dependence, cigarettes, uncomplicated: Secondary | ICD-10-CM | POA: Diagnosis not present

## 2015-09-25 DIAGNOSIS — Z9852 Vasectomy status: Secondary | ICD-10-CM | POA: Diagnosis not present

## 2015-09-25 DIAGNOSIS — Z8249 Family history of ischemic heart disease and other diseases of the circulatory system: Secondary | ICD-10-CM | POA: Diagnosis not present

## 2015-09-25 DIAGNOSIS — J449 Chronic obstructive pulmonary disease, unspecified: Secondary | ICD-10-CM | POA: Diagnosis not present

## 2015-09-25 DIAGNOSIS — Z79899 Other long term (current) drug therapy: Secondary | ICD-10-CM | POA: Diagnosis not present

## 2015-09-25 DIAGNOSIS — I252 Old myocardial infarction: Secondary | ICD-10-CM | POA: Diagnosis not present

## 2015-09-25 DIAGNOSIS — I25810 Atherosclerosis of coronary artery bypass graft(s) without angina pectoris: Secondary | ICD-10-CM | POA: Diagnosis not present

## 2015-09-25 DIAGNOSIS — G588 Other specified mononeuropathies: Secondary | ICD-10-CM | POA: Diagnosis not present

## 2015-09-25 DIAGNOSIS — Z951 Presence of aortocoronary bypass graft: Secondary | ICD-10-CM | POA: Diagnosis not present

## 2015-09-25 DIAGNOSIS — R062 Wheezing: Secondary | ICD-10-CM | POA: Diagnosis not present

## 2015-09-25 DIAGNOSIS — Z7951 Long term (current) use of inhaled steroids: Secondary | ICD-10-CM | POA: Diagnosis not present

## 2015-10-21 DIAGNOSIS — J449 Chronic obstructive pulmonary disease, unspecified: Secondary | ICD-10-CM | POA: Diagnosis not present

## 2015-10-21 DIAGNOSIS — Z79899 Other long term (current) drug therapy: Secondary | ICD-10-CM | POA: Diagnosis not present

## 2015-10-21 DIAGNOSIS — R05 Cough: Secondary | ICD-10-CM | POA: Diagnosis not present

## 2015-10-21 DIAGNOSIS — Z0000 Encounter for general adult medical examination without abnormal findings: Secondary | ICD-10-CM | POA: Diagnosis not present

## 2015-10-21 DIAGNOSIS — F17210 Nicotine dependence, cigarettes, uncomplicated: Secondary | ICD-10-CM | POA: Diagnosis not present

## 2015-10-21 DIAGNOSIS — Z9861 Coronary angioplasty status: Secondary | ICD-10-CM | POA: Diagnosis not present

## 2015-10-21 DIAGNOSIS — R0602 Shortness of breath: Secondary | ICD-10-CM | POA: Diagnosis not present

## 2015-10-21 DIAGNOSIS — I252 Old myocardial infarction: Secondary | ICD-10-CM | POA: Diagnosis not present

## 2015-10-21 DIAGNOSIS — Z7982 Long term (current) use of aspirin: Secondary | ICD-10-CM | POA: Diagnosis not present

## 2015-10-21 DIAGNOSIS — Z008 Encounter for other general examination: Secondary | ICD-10-CM | POA: Diagnosis not present

## 2015-11-04 DIAGNOSIS — I2510 Atherosclerotic heart disease of native coronary artery without angina pectoris: Secondary | ICD-10-CM | POA: Diagnosis not present

## 2015-11-04 DIAGNOSIS — Z9049 Acquired absence of other specified parts of digestive tract: Secondary | ICD-10-CM | POA: Diagnosis not present

## 2015-11-04 DIAGNOSIS — R109 Unspecified abdominal pain: Secondary | ICD-10-CM | POA: Diagnosis not present

## 2015-12-24 DIAGNOSIS — R1084 Generalized abdominal pain: Secondary | ICD-10-CM | POA: Diagnosis not present

## 2015-12-24 DIAGNOSIS — R197 Diarrhea, unspecified: Secondary | ICD-10-CM | POA: Diagnosis not present

## 2016-03-02 ENCOUNTER — Emergency Department (INDEPENDENT_AMBULATORY_CARE_PROVIDER_SITE_OTHER)
Admission: EM | Admit: 2016-03-02 | Discharge: 2016-03-02 | Disposition: A | Discharge status: Home / Self Care | Payer: Medicare Other | Source: Home / Self Care | Attending: Family Medicine | Admitting: Family Medicine

## 2016-03-02 ENCOUNTER — Encounter: Payer: Self-pay | Admitting: Emergency Medicine

## 2016-03-02 DIAGNOSIS — H6981 Other specified disorders of Eustachian tube, right ear: Secondary | ICD-10-CM

## 2016-03-02 MED ORDER — PREDNISONE 20 MG PO TABS: 20.0000 mg | ORAL_TABLET | Freq: Two times a day (BID) | ORAL | Status: DC

## 2016-03-02 NOTE — ED Notes (Signed)
Bi-lateral ear pain x 3 days

## 2016-03-02 NOTE — ED Provider Notes (Signed)
CSN: [id number]     Arrival date & time 03/02/16  1437 History   First MD Initiated Contact with Patient 03/02/16 1449     Chief Complaint  Patient presents with  . Otalgia     HPI Comments: Patient reports that both ears feel clogged, although not painful.  He has had mild increase in nasal congestion although he feels well otherwise.  The history is provided by the patient.    Past Medical History  Diagnosis Date  . MI (myocardial infarction) [facility])    Past Surgical History  Procedure Laterality Date  . Coronary angioplasty with stent placement     Family History  Problem Relation Age of Onset  . Hypertension Father   . Heart disease Father    Social History  Substance Use Topics  . Smoking status: Current Every Day Smoker -- 1.50 packs/day    Types: Cigarettes  . Smokeless tobacco: Never Used  . Alcohol Use: Yes    Review of Systems No sore throat + cough No pleuritic pain No wheezing + nasal congestion ? post-nasal drainage No sinus pain/pressure No itchy/red eyes ? earache No hemoptysis No SOB No fever/chills No nausea No vomiting No abdominal pain No diarrhea No urinary symptoms No skin rash No fatigue No myalgias No headache    Allergies  Review of patient's allergies indicates no known allergies.  Home Medications   Prior to Admission medications   Medication Sig Start Date End Date Taking? Authorizing Provider  aspirin 81 MG tablet Take 81 mg by mouth daily.    Historical Provider, MD  predniSONE (DELTASONE) 20 MG tablet Take 1 tablet (20 mg total) by mouth 2 (two) times daily. 03/02/16   Kandra Nicolas, MD   Meds Ordered and Administered this Visit  Medications - No data to display  BP 138/91 mmHg  Pulse 92  Temp(Src) 98 F (36.7 C) (Oral)  Ht 5' 10"  (1.778 m)  Wt 209 lb (94.802 kg)  BMI 29.99 kg/m2  SpO2 97% No data found.   Physical Exam Nursing notes and Vital Signs reviewed. Appearance:  Patient appears stated age, and in no  acute distress Eyes:  Pupils are equal, round, and reactive to light and accomodation.  Extraocular movement is intact.  Conjunctivae are not inflamed  Ears:  Canals normal.  Tympanic membranes normal.  Nose:  Mildly congested turbinates.  No sinus tenderness.    Pharynx:  Normal Neck:  Supple.  Mildly tender enlarged posterior/lateral nodes are palpated bilaterally  Lungs:  Clear to auscultation.  Breath sounds are equal.  Moving air well. Heart:  Regular rate and rhythm without murmurs, rubs, or gallops.    Skin:  No rash present.   ED Course  Procedures none    Labs Reviewed -   Tympanogram:  Left ear normal; Right ear negative peak pressure    MDM   1. Eustachian tube dysfunction, right    Begin prednisone burst. May take Pseudoephedrine (34m, one or two every 4 to 6 hours) for sinus congestion.     May use Afrin nasal spray (or generic oxymetazoline) twice daily for about 5 days and then discontinue.  Also recommend using saline nasal spray several times daily and saline nasal irrigation (AYR is a common brand).     Followup with ENT if not improving 2 weeks.    SKandra Nicolas MD 03/02/16 [phone]

## 2016-03-02 NOTE — Discharge Instructions (Signed)
May take Pseudoephedrine (2m, one or two every 4 to 6 hours) for sinus congestion.     May use Afrin nasal spray (or generic oxymetazoline) twice daily for about 5 days and then discontinue.  Also recommend using saline nasal spray several times daily and saline nasal irrigation (AYR is a common brand).

## 2016-03-08 DIAGNOSIS — K5000 Crohn's disease of small intestine without complications: Secondary | ICD-10-CM | POA: Diagnosis not present

## 2016-03-08 DIAGNOSIS — R1031 Right lower quadrant pain: Secondary | ICD-10-CM | POA: Diagnosis not present

## 2016-03-08 DIAGNOSIS — F17290 Nicotine dependence, other tobacco product, uncomplicated: Secondary | ICD-10-CM | POA: Diagnosis not present

## 2016-03-09 DIAGNOSIS — K6389 Other specified diseases of intestine: Secondary | ICD-10-CM | POA: Diagnosis not present

## 2016-03-09 DIAGNOSIS — K5730 Diverticulosis of large intestine without perforation or abscess without bleeding: Secondary | ICD-10-CM | POA: Diagnosis not present

## 2016-03-29 DIAGNOSIS — K5000 Crohn's disease of small intestine without complications: Secondary | ICD-10-CM | POA: Diagnosis not present

## 2016-03-29 DIAGNOSIS — R197 Diarrhea, unspecified: Secondary | ICD-10-CM | POA: Diagnosis not present

## 2016-03-29 DIAGNOSIS — R1031 Right lower quadrant pain: Secondary | ICD-10-CM | POA: Diagnosis not present

## 2016-03-29 DIAGNOSIS — R938 Abnormal findings on diagnostic imaging of other specified body structures: Secondary | ICD-10-CM | POA: Diagnosis not present

## 2016-04-01 DIAGNOSIS — K5000 Crohn's disease of small intestine without complications: Secondary | ICD-10-CM | POA: Diagnosis not present

## 2016-04-04 DIAGNOSIS — K50019 Crohn's disease of small intestine with unspecified complications: Secondary | ICD-10-CM | POA: Diagnosis not present

## 2016-04-04 DIAGNOSIS — Z8719 Personal history of other diseases of the digestive system: Secondary | ICD-10-CM | POA: Diagnosis not present

## 2016-04-04 DIAGNOSIS — K5000 Crohn's disease of small intestine without complications: Secondary | ICD-10-CM | POA: Diagnosis not present

## 2016-06-14 DIAGNOSIS — J449 Chronic obstructive pulmonary disease, unspecified: Secondary | ICD-10-CM | POA: Diagnosis not present

## 2016-06-14 DIAGNOSIS — I2510 Atherosclerotic heart disease of native coronary artery without angina pectoris: Secondary | ICD-10-CM | POA: Insufficient documentation

## 2016-06-14 DIAGNOSIS — I252 Old myocardial infarction: Secondary | ICD-10-CM | POA: Diagnosis not present

## 2016-06-14 DIAGNOSIS — Z955 Presence of coronary angioplasty implant and graft: Secondary | ICD-10-CM | POA: Diagnosis not present

## 2016-07-21 DIAGNOSIS — K5000 Crohn's disease of small intestine without complications: Secondary | ICD-10-CM | POA: Diagnosis not present

## 2016-07-21 DIAGNOSIS — R1010 Upper abdominal pain, unspecified: Secondary | ICD-10-CM | POA: Diagnosis not present

## 2016-07-21 DIAGNOSIS — R112 Nausea with vomiting, unspecified: Secondary | ICD-10-CM | POA: Diagnosis not present

## 2016-07-21 DIAGNOSIS — F17290 Nicotine dependence, other tobacco product, uncomplicated: Secondary | ICD-10-CM | POA: Diagnosis not present

## 2016-08-14 DIAGNOSIS — M791 Myalgia: Secondary | ICD-10-CM | POA: Diagnosis not present

## 2016-08-14 DIAGNOSIS — R062 Wheezing: Secondary | ICD-10-CM | POA: Diagnosis not present

## 2016-08-14 DIAGNOSIS — Z7982 Long term (current) use of aspirin: Secondary | ICD-10-CM | POA: Diagnosis not present

## 2016-08-14 DIAGNOSIS — N289 Disorder of kidney and ureter, unspecified: Secondary | ICD-10-CM | POA: Diagnosis not present

## 2016-08-14 DIAGNOSIS — K4090 Unilateral inguinal hernia, without obstruction or gangrene, not specified as recurrent: Secondary | ICD-10-CM | POA: Diagnosis not present

## 2016-08-14 DIAGNOSIS — K5000 Crohn's disease of small intestine without complications: Secondary | ICD-10-CM | POA: Diagnosis not present

## 2016-08-14 DIAGNOSIS — F17210 Nicotine dependence, cigarettes, uncomplicated: Secondary | ICD-10-CM | POA: Diagnosis not present

## 2016-08-14 DIAGNOSIS — J449 Chronic obstructive pulmonary disease, unspecified: Secondary | ICD-10-CM | POA: Diagnosis not present

## 2016-08-14 DIAGNOSIS — R0602 Shortness of breath: Secondary | ICD-10-CM | POA: Diagnosis not present

## 2016-08-14 DIAGNOSIS — R1084 Generalized abdominal pain: Secondary | ICD-10-CM | POA: Diagnosis not present

## 2016-08-14 DIAGNOSIS — I252 Old myocardial infarction: Secondary | ICD-10-CM | POA: Diagnosis not present

## 2016-08-21 DIAGNOSIS — R062 Wheezing: Secondary | ICD-10-CM | POA: Diagnosis not present

## 2016-08-21 DIAGNOSIS — I252 Old myocardial infarction: Secondary | ICD-10-CM | POA: Diagnosis not present

## 2016-08-21 DIAGNOSIS — F17210 Nicotine dependence, cigarettes, uncomplicated: Secondary | ICD-10-CM | POA: Diagnosis not present

## 2016-08-21 DIAGNOSIS — R0602 Shortness of breath: Secondary | ICD-10-CM | POA: Diagnosis not present

## 2016-08-21 DIAGNOSIS — R918 Other nonspecific abnormal finding of lung field: Secondary | ICD-10-CM | POA: Diagnosis not present

## 2016-08-21 DIAGNOSIS — R0600 Dyspnea, unspecified: Secondary | ICD-10-CM | POA: Diagnosis not present

## 2016-08-21 DIAGNOSIS — R0789 Other chest pain: Secondary | ICD-10-CM | POA: Diagnosis not present

## 2016-08-21 DIAGNOSIS — J209 Acute bronchitis, unspecified: Secondary | ICD-10-CM | POA: Diagnosis not present

## 2016-10-18 DIAGNOSIS — I25729 Atherosclerosis of autologous artery coronary artery bypass graft(s) with unspecified angina pectoris: Secondary | ICD-10-CM | POA: Diagnosis not present

## 2016-10-24 DIAGNOSIS — J449 Chronic obstructive pulmonary disease, unspecified: Secondary | ICD-10-CM | POA: Diagnosis not present

## 2016-10-24 DIAGNOSIS — I252 Old myocardial infarction: Secondary | ICD-10-CM | POA: Diagnosis not present

## 2016-10-24 DIAGNOSIS — R42 Dizziness and giddiness: Secondary | ICD-10-CM | POA: Diagnosis not present

## 2016-10-24 DIAGNOSIS — Z9861 Coronary angioplasty status: Secondary | ICD-10-CM | POA: Diagnosis not present

## 2016-10-24 DIAGNOSIS — R0602 Shortness of breath: Secondary | ICD-10-CM | POA: Diagnosis not present

## 2016-10-24 DIAGNOSIS — F17210 Nicotine dependence, cigarettes, uncomplicated: Secondary | ICD-10-CM | POA: Diagnosis not present

## 2016-10-24 DIAGNOSIS — Z7982 Long term (current) use of aspirin: Secondary | ICD-10-CM | POA: Diagnosis not present

## 2016-10-24 DIAGNOSIS — R5383 Other fatigue: Secondary | ICD-10-CM | POA: Diagnosis not present

## 2016-10-24 DIAGNOSIS — Z981 Arthrodesis status: Secondary | ICD-10-CM | POA: Diagnosis not present

## 2016-10-24 DIAGNOSIS — R070 Pain in throat: Secondary | ICD-10-CM | POA: Diagnosis not present

## 2016-10-24 DIAGNOSIS — Z79899 Other long term (current) drug therapy: Secondary | ICD-10-CM | POA: Diagnosis not present

## 2016-10-24 DIAGNOSIS — I2510 Atherosclerotic heart disease of native coronary artery without angina pectoris: Secondary | ICD-10-CM | POA: Diagnosis not present

## 2016-10-24 DIAGNOSIS — J069 Acute upper respiratory infection, unspecified: Secondary | ICD-10-CM | POA: Diagnosis not present

## 2016-10-25 DIAGNOSIS — J069 Acute upper respiratory infection, unspecified: Secondary | ICD-10-CM | POA: Diagnosis not present

## 2016-10-25 DIAGNOSIS — K5000 Crohn's disease of small intestine without complications: Secondary | ICD-10-CM | POA: Diagnosis not present

## 2016-10-25 DIAGNOSIS — R197 Diarrhea, unspecified: Secondary | ICD-10-CM | POA: Diagnosis not present

## 2016-10-26 ENCOUNTER — Ambulatory Visit: Payer: Medicare Other | Admitting: Family Medicine

## 2016-10-31 ENCOUNTER — Encounter: Payer: Self-pay | Admitting: Family Medicine

## 2016-10-31 ENCOUNTER — Ambulatory Visit (INDEPENDENT_AMBULATORY_CARE_PROVIDER_SITE_OTHER): Payer: Medicare Other | Admitting: Family Medicine

## 2016-10-31 VITALS — BP 145/76 | HR 106 | Ht 70.0 in | Wt 211.0 lb

## 2016-10-31 DIAGNOSIS — F17200 Nicotine dependence, unspecified, uncomplicated: Secondary | ICD-10-CM | POA: Diagnosis not present

## 2016-10-31 DIAGNOSIS — J209 Acute bronchitis, unspecified: Secondary | ICD-10-CM | POA: Diagnosis not present

## 2016-10-31 DIAGNOSIS — Z1159 Encounter for screening for other viral diseases: Secondary | ICD-10-CM

## 2016-10-31 DIAGNOSIS — F17210 Nicotine dependence, cigarettes, uncomplicated: Secondary | ICD-10-CM

## 2016-10-31 DIAGNOSIS — E785 Hyperlipidemia, unspecified: Secondary | ICD-10-CM

## 2016-10-31 DIAGNOSIS — K5000 Crohn's disease of small intestine without complications: Secondary | ICD-10-CM | POA: Diagnosis not present

## 2016-10-31 DIAGNOSIS — J440 Chronic obstructive pulmonary disease with acute lower respiratory infection: Secondary | ICD-10-CM

## 2016-10-31 DIAGNOSIS — R739 Hyperglycemia, unspecified: Secondary | ICD-10-CM

## 2016-10-31 MED ORDER — SIMVASTATIN 20 MG PO TABS: 20.0000 mg | ORAL_TABLET | Freq: Every evening | ORAL | 0 refills | Status: DC

## 2016-10-31 MED ORDER — ALBUTEROL SULFATE HFA 108 (90 BASE) MCG/ACT IN AERS: 2.0000 | INHALATION_SPRAY | Freq: Four times a day (QID) | RESPIRATORY_TRACT | 0 refills | Status: DC | PRN

## 2016-10-31 MED ORDER — PROMETHAZINE HCL 25 MG PO TABS: 25.0000 mg | ORAL_TABLET | Freq: Three times a day (TID) | ORAL | 1 refills | Status: DC | PRN

## 2016-10-31 MED ORDER — VARENICLINE TARTRATE 0.5 MG X 11 & 1 MG X 42 PO MISC: ORAL | 0 refills | Status: DC

## 2016-10-31 MED ORDER — PREDNISONE 50 MG PO TABS: 50.0000 mg | ORAL_TABLET | Freq: Every day | ORAL | 0 refills | Status: DC

## 2016-10-31 NOTE — Patient Instructions (Signed)
Thank you for coming in today. Take prednisone.  Use albuterol as needed.  Start Chantix.  STOP smoking.  Return for recheck in 1 month.  Return sooner if needed.

## 2016-10-31 NOTE — Progress Notes (Signed)
David Woodard is a 65 y.o. male who presents to [facility]: [facility] today for establish care.  Patient has multiple medical issues including smoking, coronary artery disease, chronic abdominal pain, chronic back pain.  He was recently seen in urgent care for shortness of breath and diagnosed with a viral URI. He notes he has not improved; he notes continued wheezing and shortness of breath. He notes that albuterol sometimes helps.  Abdominal pain: Patient is a history of Crohn's disease and has chronic recurrent abdominal and chest pain. He currently takes Humira and seems to be doing pretty well. He notes that chronic abdominal and chest pain can be quite bad at times. He denies current vomiting or diarrhea.  Smoking: Patient smokes about 2 packs a day chronically. He would like to quit but has had difficulty quitting in the past.   Past Medical History:  Diagnosis Date  . COPD with acute bronchitis ([place]) 11/11/2010   Qualifier: Diagnosis of  By: Joya Gaskins MD, Burnett Harry   . Crohn's ileitis ([place]) 09/15/2015   Overview:  GI   . MI (myocardial infarction)    Past Surgical History:  Procedure Laterality Date  . CORONARY ANGIOPLASTY WITH STENT PLACEMENT     Social History  Substance Use Topics  . Smoking status: Current Every Day Smoker    Packs/day: 1.50    Types: Cigarettes  . Smokeless tobacco: Never Used  . Alcohol use Yes   family history includes Heart disease in his father; Hypertension in his father.  ROS as above: No new headache, visual changes, nausea, vomiting, diarrhea, constipation, dizziness, abdominal pain, skin rash, fevers, chills, night sweats, weight loss, swollen lymph nodes, body aches, joint swelling, muscle aches, chest pain, shortness of breath, mood changes, visual or auditory hallucinations.   Medications: Current Outpatient Prescriptions    Medication Sig Dispense Refill  . aspirin 81 MG tablet Take 81 mg by mouth daily.    . Adalimumab (HUMIRA PEN [place]) Inject into the skin.    Marland Kitchen albuterol (PROVENTIL HFA;VENTOLIN HFA) 108 (90 Base) MCG/ACT inhaler Inhale 2 puffs into the lungs every 6 (six) hours as needed for wheezing or shortness of breath. 1 Inhaler 0  . predniSONE (DELTASONE) 50 MG tablet Take 1 tablet (50 mg total) by mouth daily. 5 tablet 0  . promethazine (PHENERGAN) 25 MG tablet Take 1 tablet (25 mg total) by mouth every 8 (eight) hours as needed for nausea or vomiting. 30 tablet 1  . simvastatin (ZOCOR) 20 MG tablet Take 1 tablet (20 mg total) by mouth every evening. 90 tablet 0  . varenicline (CHANTIX STARTING MONTH PAK) 0.5 MG X 11 & 1 MG X 42 tablet Take one 0.67m tablet by mouth once daily for 3 days, then increase to one 0.566mtablet twice daily for 3 days, then increase to one 38m36mablet twice daily. 53 tablet 0   No current facility-administered medications for this visit.    No Known Allergies  Health Maintenance Health Maintenance  Topic Date Due  . Hepatitis C Screening  06/03/27/1952 TETANUS/TDAP  05/28/1968  . COLONOSCOPY  05/29/1999  . ZOSTAVAX  05/28/2009  . PNA vac Low Risk Adult (1 of 2 - PCV13) 05/28/2014  . INFLUENZA VACCINE  06/28/2016     Exam:  BP (!) 145/76   Pulse (!) 106   Ht 5' 10"  (1.778 m)   Wt 211 lb (95.7 kg)   BMI 30.28 kg/m  Gen: Well NAD HEENT: EOMI,  MMM Lungs: Normal work of breathing. CTABL Heart: Mild tachycardia present no MRG Abd: NABS, Soft. Mildly distended without masses palpated, Nontender Exts: Nonedematous bilateral lower extremities   No results found for this or any previous visit (from the past 60 hour(s)). No results found.    Assessment and Plan: 65 y.o. male with  COPD with exacerbation: Treat with prednisone and albuterol.  Tachycardia and hypertension: Unclear etiology probably related to current illness. Check basic labs listed  below.  Hyperlipidemia: Recent lipid panel present. Continue simvastatin.  Smoking: Work on smoking cessation. Chantix prescribed.  Recheck in one month.   Orders Placed This Encounter  Procedures  . CBC  . COMPLETE METABOLIC PANEL WITH GFR  . Hemoglobin A1c  . Hepatitis C antibody    Discussed warning signs or symptoms. Please see discharge instructions. Patient expresses understanding.

## 2016-11-12 DIAGNOSIS — R42 Dizziness and giddiness: Secondary | ICD-10-CM | POA: Diagnosis not present

## 2016-11-12 DIAGNOSIS — F17210 Nicotine dependence, cigarettes, uncomplicated: Secondary | ICD-10-CM | POA: Diagnosis not present

## 2016-11-12 DIAGNOSIS — K5730 Diverticulosis of large intestine without perforation or abscess without bleeding: Secondary | ICD-10-CM | POA: Diagnosis not present

## 2016-11-12 DIAGNOSIS — N289 Disorder of kidney and ureter, unspecified: Secondary | ICD-10-CM | POA: Diagnosis not present

## 2016-11-12 DIAGNOSIS — Z79899 Other long term (current) drug therapy: Secondary | ICD-10-CM | POA: Diagnosis not present

## 2016-11-12 DIAGNOSIS — E860 Dehydration: Secondary | ICD-10-CM | POA: Diagnosis not present

## 2016-11-12 DIAGNOSIS — R079 Chest pain, unspecified: Secondary | ICD-10-CM | POA: Diagnosis not present

## 2016-11-12 DIAGNOSIS — K50019 Crohn's disease of small intestine with unspecified complications: Secondary | ICD-10-CM | POA: Diagnosis not present

## 2016-11-12 DIAGNOSIS — Z7982 Long term (current) use of aspirin: Secondary | ICD-10-CM | POA: Diagnosis not present

## 2016-11-12 DIAGNOSIS — J449 Chronic obstructive pulmonary disease, unspecified: Secondary | ICD-10-CM | POA: Diagnosis not present

## 2016-11-12 DIAGNOSIS — I252 Old myocardial infarction: Secondary | ICD-10-CM | POA: Diagnosis not present

## 2016-11-29 ENCOUNTER — Encounter: Payer: Self-pay | Admitting: Family Medicine

## 2016-11-29 ENCOUNTER — Ambulatory Visit (INDEPENDENT_AMBULATORY_CARE_PROVIDER_SITE_OTHER): Payer: Medicare Other | Admitting: Family Medicine

## 2016-11-29 VITALS — BP 124/76 | HR 99 | Wt 217.0 lb

## 2016-11-29 DIAGNOSIS — F17200 Nicotine dependence, unspecified, uncomplicated: Secondary | ICD-10-CM

## 2016-11-29 DIAGNOSIS — F17210 Nicotine dependence, cigarettes, uncomplicated: Secondary | ICD-10-CM | POA: Diagnosis not present

## 2016-11-29 DIAGNOSIS — J449 Chronic obstructive pulmonary disease, unspecified: Secondary | ICD-10-CM

## 2016-11-29 DIAGNOSIS — K5000 Crohn's disease of small intestine without complications: Secondary | ICD-10-CM

## 2016-11-29 MED ORDER — VARENICLINE TARTRATE 0.5 MG X 11 & 1 MG X 42 PO MISC: ORAL | 0 refills | Status: DC

## 2016-11-29 NOTE — Patient Instructions (Signed)
Thank you for coming in today. Use the Symbicort twice daily.  Use chantix,  Recheck in 1 month.  Call or go to the emergency room if you get worse, have trouble breathing, have chest pains, or palpitations.

## 2016-11-29 NOTE — Progress Notes (Signed)
David Woodard is a 66 y.o. male who presents to [facility]: [facility] today for follow-up COPD and smoking.  Patient continues to cough and have shortness of breath. He's been using his albuterol inhaler multiple times per day. He's completed 2 separate prednisone courses 1 for bronchitis and one for Crohn's disease exacerbation. He does not use a controller inhaler. He denies chest pain or palpitations. He does note shortness of breath with exertion however.  Smoking: Patient was unable to afford Chantix. He has Medicare and would have to pay $384 for one month of Chantix last month because of the donut hole. He's interested in seeing how expensive it is this month.   Abdominal pain: Patient has a history of Crohn's disease and has had frequent intermittent episodes of abdominal pain. He was seen in the emergency department about 3 weeks ago and was diagnosed with ileitis and treated with a prednisone Dosepak. Feeling better now and has an appointment with his gastroenterologist future. He continues to take Humira.   Past Medical History:  Diagnosis Date  . COPD with acute bronchitis ([place]) 11/11/2010   Qualifier: Diagnosis of  By: Joya Gaskins MD, Burnett Harry   . Crohn's ileitis ([place]) 09/15/2015   Overview:  GI   . MI (myocardial infarction)    Past Surgical History:  Procedure Laterality Date  . CORONARY ANGIOPLASTY WITH STENT PLACEMENT     Social History  Substance Use Topics  . Smoking status: Current Every Day Smoker    Packs/day: 1.50    Types: Cigarettes  . Smokeless tobacco: Never Used  . Alcohol use Yes   family history includes Heart disease in his father; Hypertension in his father.  ROS as above:  Medications: Current Outpatient Prescriptions  Medication Sig Dispense Refill  . Adalimumab (HUMIRA PEN [place]) Inject into the skin.    Marland Kitchen albuterol (PROVENTIL HFA;VENTOLIN  HFA) 108 (90 Base) MCG/ACT inhaler Inhale 2 puffs into the lungs every 6 (six) hours as needed for wheezing or shortness of breath. 1 Inhaler 0  . aspirin 81 MG tablet Take 81 mg by mouth daily.    . predniSONE (DELTASONE) 50 MG tablet Take 1 tablet (50 mg total) by mouth daily. 5 tablet 0  . promethazine (PHENERGAN) 25 MG tablet Take 1 tablet (25 mg total) by mouth every 8 (eight) hours as needed for nausea or vomiting. 30 tablet 1  . simvastatin (ZOCOR) 20 MG tablet Take 1 tablet (20 mg total) by mouth every evening. 90 tablet 0  . varenicline (CHANTIX STARTING MONTH PAK) 0.5 MG X 11 & 1 MG X 42 tablet Take one 0.1048m tablet by mouth once daily for 3 days, then increase to one 0.563mtablet twice daily for 3 days, then increase to one 48m20mablet twice daily. 53 tablet 0   No current facility-administered medications for this visit.    No Known Allergies  Health Maintenance Health Maintenance  Topic Date Due  . Hepatitis C Screening  07/07-Jul-1951 TETANUS/TDAP  05/28/1968  . COLONOSCOPY  05/29/1999  . ZOSTAVAX  05/28/2009  . PNA vac Low Risk Adult (1 of 2 - PCV13) 05/28/2014  . INFLUENZA VACCINE  06/28/2016     Exam:  BP 124/76   Pulse 99   Wt 217 lb (98.4 kg)   SpO2 99%   BMI 31.14 kg/m  Gen: Well NAD HEENT: EOMI,  MMM Lungs: Normal work of breathing. Coarse breath sounds and wheezing present  bilaterally Heart: RRR no MRG Abd: NABS, Soft. Nondistended, Nontender Exts: Brisk capillary refill, warm and well perfused.    No results found for this or any previous visit (from the past 72 hour(s)). No results found.    Assessment and Plan: 66 y.o. male with  COPD: Recurrent. Plan to add Symbicort S patient continues to have frequent exacerbations. I dispensed a sample today and will recheck in one month. When patient as well as think he would benefit from a pulmonary function test at he's too sick currently.  Smoking: Patient wishes to quit smoking. We'll represcribed  Chantix. I have informed patient about [facility] program.  Crohn's disease: Recommend follow-up with gastroenterology. Patient seems to be doing well currently.   No orders of the defined types were placed in this encounter.   Discussed warning signs or symptoms. Please see discharge instructions. Patient expresses understanding.

## 2016-12-27 ENCOUNTER — Ambulatory Visit (INDEPENDENT_AMBULATORY_CARE_PROVIDER_SITE_OTHER): Payer: Medicare Other | Admitting: Family Medicine

## 2016-12-27 ENCOUNTER — Encounter: Payer: Self-pay | Admitting: Family Medicine

## 2016-12-27 VITALS — BP 134/81 | HR 92 | Wt 214.0 lb

## 2016-12-27 DIAGNOSIS — K5000 Crohn's disease of small intestine without complications: Secondary | ICD-10-CM | POA: Diagnosis not present

## 2016-12-27 DIAGNOSIS — J449 Chronic obstructive pulmonary disease, unspecified: Secondary | ICD-10-CM

## 2016-12-27 DIAGNOSIS — F17210 Nicotine dependence, cigarettes, uncomplicated: Secondary | ICD-10-CM

## 2016-12-27 MED ORDER — ALBUTEROL SULFATE HFA 108 (90 BASE) MCG/ACT IN AERS: 2.0000 | INHALATION_SPRAY | Freq: Four times a day (QID) | RESPIRATORY_TRACT | 2 refills | Status: DC | PRN

## 2016-12-27 MED ORDER — BUDESONIDE-FORMOTEROL FUMARATE 160-4.5 MCG/ACT IN AERO: 2.0000 | INHALATION_SPRAY | Freq: Two times a day (BID) | RESPIRATORY_TRACT | 3 refills | Status: DC

## 2016-12-27 MED ORDER — BUPROPION HCL ER (XL) 150 MG PO TB24: 150.0000 mg | ORAL_TABLET | ORAL | 2 refills | Status: DC

## 2016-12-27 NOTE — Patient Instructions (Signed)
Thank you for coming in today. Recheck in 2-3 months or sooner if needed.  Call or go to the emergency room if you get worse, have trouble breathing, have chest pains, or palpitations.    Chronic Obstructive Pulmonary Disease Chronic obstructive pulmonary disease (COPD) is a common lung problem. In COPD, the flow of air from the lungs is limited. The way your lungs work will probably never return to normal, but there are things you can do to improve your lungs and make yourself feel better. Your doctor may treat your condition with:  Medicines.  Oxygen.  Lung surgery.  Changes to your diet.  Rehabilitation. This may involve a team of specialists. Follow these instructions at home:  Take all medicines as told by your doctor.  Avoid medicines or cough syrups that dry up your airway (such as antihistamines) and do not allow you to get rid of thick spit. You do not need to avoid them if told differently by your doctor.  If you smoke, stop. Smoking makes the problem worse.  Avoid being around things that make your breathing worse (like smoke, chemicals, and fumes).  Use oxygen therapy and therapy to help improve your lungs (pulmonary rehabilitation) if told by your doctor. If you need home oxygen therapy, ask your doctor if you should buy a tool to measure your oxygen level (oximeter).  Avoid people who have a sickness you can catch (contagious).  Avoid going outside when it is very hot, cold, or humid.  Eat healthy foods. Eat smaller meals more often. Rest before meals.  Stay active, but remember to also rest.  Make sure to get all the shots (vaccines) your doctor recommends. Ask your doctor if you need a pneumonia shot.  Learn and use tips on how to relax.  Learn and use tips on how to control your breathing as told by your doctor. Try: 1. Breathing in (inhaling) through your nose for 1 second. Then, pucker your lips and breath out (exhale) through your lips for 2  seconds. 2. Putting one hand on your belly (abdomen). Breathe in slowly through your nose for 1 second. Your hand on your belly should move out. Pucker your lips and breathe out slowly through your lips. Your hand on your belly should move in as you breathe out.  Learn and use controlled coughing to clear thick spit from your lungs. The steps are: 1. Lean your head a little forward. 2. Breathe in deeply. 3. Try to hold your breath for 3 seconds. 4. Keep your mouth slightly open while coughing 2 times. 5. Spit any thick spit out into a tissue. 6. Rest and do the steps again 1 or 2 times as needed. Contact a doctor if:  You cough up more thick spit than usual.  There is a change in the color or thickness of the spit.  It is harder to breathe than usual.  Your breathing is faster than usual. Get help right away if:  You have shortness of breath while resting.  You have shortness of breath that stops you from:  Being able to talk.  Doing normal activities.  You chest hurts for longer than 5 minutes.  Your skin color is more blue than usual.  Your pulse oximeter shows that you have low oxygen for longer than 5 minutes. This information is not intended to replace advice given to you by your health care provider. Make sure you discuss any questions you have with your health care provider. Document Released:  05/02/2008 Document Revised: 04/21/2016 Document Reviewed: 07/11/2013 Elsevier Interactive Patient Education  2017 [employer].

## 2016-12-27 NOTE — Progress Notes (Signed)
David Woodard is a 66 y.o. male who presents to [place] [facility] today for COPD and smoking.   COPD: Patient continues to have shortness of breath.  He does note that the Symbicort that was dispensed at the last visit has improved his lung functioning. He denies any chest pain or palpitations.  Smoking: Patient continues to smoke. He cannot afford Chantix but would like to quit smoking. He's had no success in the past using patches or gums.  Crohn's ileitis: Patient is to have intermittent abdominal pain attributable to Crohn's disease. He continues to follow-up with gastroenterology.   Past Medical History:  Diagnosis Date  . COPD with acute bronchitis ([place]) 11/11/2010   Qualifier: Diagnosis of  By: Joya Gaskins MD, Burnett Harry   . Crohn's ileitis ([place]) 09/15/2015   Overview:  GI   . MI (myocardial infarction)    Past Surgical History:  Procedure Laterality Date  . CORONARY ANGIOPLASTY WITH STENT PLACEMENT     Social History  Substance Use Topics  . Smoking status: Current Every Day Smoker    Packs/day: 1.50    Types: Cigarettes  . Smokeless tobacco: Never Used  . Alcohol use Yes   family history includes Heart disease in his father; Hypertension in his father.  ROS as above:  Medications: Current Outpatient Prescriptions  Medication Sig Dispense Refill  . Adalimumab (HUMIRA PEN [place]) Inject into the skin.    Marland Kitchen albuterol (PROVENTIL HFA;VENTOLIN HFA) 108 (90 Base) MCG/ACT inhaler Inhale 2 puffs into the lungs every 6 (six) hours as needed for wheezing or shortness of breath. 1 Inhaler 2  . aspirin 81 MG tablet Take 81 mg by mouth daily.    . budesonide-formoterol (SYMBICORT) 160-4.5 MCG/ACT inhaler Inhale 2 puffs into the lungs 2 (two) times daily. 1 Inhaler 3  . promethazine (PHENERGAN) 25 MG tablet Take 1 tablet (25 mg total) by mouth every 8 (eight) hours as needed  for nausea or vomiting. 30 tablet 1  . simvastatin (ZOCOR) 20 MG tablet Take 1 tablet (20 mg total) by mouth every evening. 90 tablet 0  . buPROPion (WELLBUTRIN XL) 150 MG 24 hr tablet Take 1 tablet (150 mg total) by mouth every morning. 30 tablet 2   No current facility-administered medications for this visit.    No Known Allergies  Health Maintenance Health Maintenance  Topic Date Due  . Hepatitis C Screening  July 26, 1951  . TETANUS/TDAP  05/28/1968  . COLONOSCOPY  05/29/1999  . ZOSTAVAX  05/28/2009  . PNA vac Low Risk Adult (1 of 2 - PCV13) 05/28/2014  . INFLUENZA VACCINE  07/29/2017 (Originally 06/28/2016)     Exam:  BP 134/81   Pulse 92   Wt 214 lb (97.1 kg)   BMI 30.71 kg/m  Gen: Well NAD HEENT: EOMI,  MMM Lungs: Normal work of breathing. Wheezing present bilaterally. Heart: RRR no MRG Abd: NABS, Soft. Nondistended, Nontender Exts: Brisk capillary refill, warm and well perfused.    No results found for this or any previous visit (from the past 72 hour(s)). No results found.    Assessment and Plan: 66 y.o. male with  COPD: Not ideally controlled. Continue Symbicort. I dispense another sample of Symbicort. I'm concerned about his ability to for this medication. I notified him that it probably would be cheaper for him to get his pulmonology care at the [facility].  Smoking: Patient cannot afford Chantix. We'll use Wellbutrin and recheck in about a month  or so.  Crohn's disease: Doing reasonably well. He continues to have intermittent pain.   No orders of the defined types were placed in this encounter.   Discussed warning signs or symptoms. Please see discharge instructions. Patient expresses understanding.

## 2017-01-24 ENCOUNTER — Ambulatory Visit: Payer: Medicare Other | Admitting: Family Medicine

## 2017-03-06 ENCOUNTER — Ambulatory Visit (INDEPENDENT_AMBULATORY_CARE_PROVIDER_SITE_OTHER): Payer: Medicare Other | Admitting: Family Medicine

## 2017-03-06 ENCOUNTER — Encounter: Payer: Self-pay | Admitting: Family Medicine

## 2017-03-06 VITALS — BP 128/76 | HR 96 | Wt 210.0 lb

## 2017-03-06 DIAGNOSIS — F17210 Nicotine dependence, cigarettes, uncomplicated: Secondary | ICD-10-CM | POA: Diagnosis not present

## 2017-03-06 DIAGNOSIS — E785 Hyperlipidemia, unspecified: Secondary | ICD-10-CM | POA: Diagnosis not present

## 2017-03-06 DIAGNOSIS — J449 Chronic obstructive pulmonary disease, unspecified: Secondary | ICD-10-CM | POA: Diagnosis not present

## 2017-03-06 MED ORDER — SIMVASTATIN 20 MG PO TABS: 20.0000 mg | ORAL_TABLET | Freq: Every evening | ORAL | 0 refills | Status: DC

## 2017-03-06 NOTE — Patient Instructions (Signed)
Thank you for coming in today. Start simvastatin.  It should be less than $13 for 90 days David Woodard.  Continue the breathing medicine inhaler.  Work on cutting back smoking.   Recheck in 2 months.

## 2017-03-06 NOTE — Progress Notes (Signed)
David Woodard is a 66 y.o. male who presents to [place] today for COPD and smoking.    COPD: Patient has been taking Symbicort until he ran out of samples last week.  He doesn't want to pursue care through the [facility] for anything other than his eyewear due to a past negative experience. He thinks that it has helped with his shortness of breath.  He has been taking the albuterol every 2-5 days as needed, which is roughly unchanged from his past use.  Smoking: Patient has cut down to a pack every 2-3 days.  He has been using the nicotine gum which has been helping with his cravings but he would like to quit.   He has not been taking the statin or wellbutrin over concerns of cost.     Past Medical History:  Diagnosis Date  . COPD with acute bronchitis ([place]) 11/11/2010   Qualifier: Diagnosis of  By: Joya Gaskins MD, Burnett Harry   . Crohn's ileitis ([place]) 09/15/2015   Overview:  GI   . MI (myocardial infarction)    Past Surgical History:  Procedure Laterality Date  . CORONARY ANGIOPLASTY WITH STENT PLACEMENT     Social History  Substance Use Topics  . Smoking status: Current Every Day Smoker    Packs/day: 1.50    Types: Cigarettes  . Smokeless tobacco: Never Used  . Alcohol use Yes   family history includes Heart disease in his father; Hypertension in his father.  ROS as above:  Medications: Current Outpatient Prescriptions  Medication Sig Dispense Refill  . Adalimumab (HUMIRA PEN [place]) Inject into the skin.    Marland Kitchen albuterol (PROVENTIL HFA;VENTOLIN HFA) 108 (90 Base) MCG/ACT inhaler Inhale 2 puffs into the lungs every 6 (six) hours as needed for wheezing or shortness of breath. 1 Inhaler 2  . aspirin 81 MG tablet Take 81 mg by mouth daily.    . budesonide-formoterol (SYMBICORT) 160-4.5 MCG/ACT inhaler Inhale 2 puffs into the lungs 2 (two) times daily. 1 Inhaler 3  .  buPROPion (WELLBUTRIN XL) 150 MG 24 hr tablet Take 1 tablet (150 mg total) by mouth every morning. 30 tablet 2  . promethazine (PHENERGAN) 25 MG tablet Take 1 tablet (25 mg total) by mouth every 8 (eight) hours as needed for nausea or vomiting. 30 tablet 1  . simvastatin (ZOCOR) 20 MG tablet Take 1 tablet (20 mg total) by mouth every evening. 90 tablet 0   No current facility-administered medications for this visit.    No Known Allergies  Health Maintenance Health Maintenance  Topic Date Due  . Hepatitis C Screening  06/05/1951  . TETANUS/TDAP  05/28/1968  . COLONOSCOPY  05/29/1999  . PNA vac Low Risk Adult (1 of 2 - PCV13) 05/28/2014  . INFLUENZA VACCINE  07/29/2017 (Originally 06/28/2017)     Exam:  BP 128/76   Pulse 96   Wt 210 lb (95.3 kg)   BMI 30.13 kg/m  Gen: Well NAD HEENT: slightly retracted bilaterally Lungs: Normal work of breathing.  Heart: RRR no MRG Abd: NABS, Soft. Nondistended, Nontender Exts: Brisk capillary refill, warm and well perfused.    No results found for this or any previous visit (from the past 72 hour(s)). No results found.    Assessment and Plan: 66 y.o. male with COPD, smoking cessation  COPD:  Continue Symbicort samples.    Smoking: Smoking cessation was encouraged.  Patient seems optimistic about quitting.  HLD.: Patient was given coupon for simvastatin and encouraged to start taking medication   f/u in 2 months.  No orders of the defined types were placed in this encounter.  Meds ordered this encounter  Medications  . simvastatin (ZOCOR) 20 MG tablet    Sig: Take 1 tablet (20 mg total) by mouth every evening.    Dispense:  90 tablet    Refill:  0     Discussed warning signs or symptoms. Please see discharge instructions. Patient expresses understanding.

## 2017-05-04 ENCOUNTER — Ambulatory Visit (INDEPENDENT_AMBULATORY_CARE_PROVIDER_SITE_OTHER): Payer: Medicare Other | Admitting: Family Medicine

## 2017-05-04 ENCOUNTER — Ambulatory Visit (INDEPENDENT_AMBULATORY_CARE_PROVIDER_SITE_OTHER): Payer: Medicare Other

## 2017-05-04 VITALS — BP 153/74 | HR 99 | Temp 97.7°F | Wt 211.0 lb

## 2017-05-04 DIAGNOSIS — R1310 Dysphagia, unspecified: Secondary | ICD-10-CM

## 2017-05-04 DIAGNOSIS — I252 Old myocardial infarction: Secondary | ICD-10-CM

## 2017-05-04 DIAGNOSIS — J449 Chronic obstructive pulmonary disease, unspecified: Secondary | ICD-10-CM

## 2017-05-04 DIAGNOSIS — I2510 Atherosclerotic heart disease of native coronary artery without angina pectoris: Secondary | ICD-10-CM | POA: Diagnosis not present

## 2017-05-04 DIAGNOSIS — R002 Palpitations: Secondary | ICD-10-CM

## 2017-05-04 DIAGNOSIS — K5000 Crohn's disease of small intestine without complications: Secondary | ICD-10-CM

## 2017-05-04 DIAGNOSIS — R05 Cough: Secondary | ICD-10-CM | POA: Diagnosis not present

## 2017-05-04 DIAGNOSIS — F17200 Nicotine dependence, unspecified, uncomplicated: Secondary | ICD-10-CM | POA: Diagnosis not present

## 2017-05-04 MED ORDER — PREDNISONE 5 MG (48) PO TBPK: ORAL_TABLET | ORAL | 0 refills | Status: DC

## 2017-05-04 MED ORDER — AZITHROMYCIN 250 MG PO TABS: 250.0000 mg | ORAL_TABLET | Freq: Every day | ORAL | 0 refills | Status: DC

## 2017-05-04 NOTE — Patient Instructions (Signed)
Thank you for coming in today. For breathing today get an xray.  Continue albuterol as needed.  Take Symbicort twice daily.  Take prednisone and azithromycin for cough.    For heart follow up with heart doctor.   For trouble swallowing please make an appointment with your stomach doctor (gastroenterology). I think you need a scope.    Chronic Obstructive Pulmonary Disease Exacerbation Chronic obstructive pulmonary disease (COPD) is a common lung problem. In COPD, the flow of air from the lungs is limited. COPD exacerbations are times that breathing gets worse and you need extra treatment. Without treatment they can be life threatening. If they happen often, your lungs can become more damaged. If your COPD gets worse, your doctor may treat you with:  Medicines.  Oxygen.  Different ways to clear your airway, such as using a mask.  Follow these instructions at home:  Do not smoke.  Avoid tobacco smoke and other things that bother your lungs.  If given, take your antibiotic medicine as told. Finish the medicine even if you start to feel better.  Only take medicines as told by your doctor.  Drink enough fluids to keep your pee (urine) clear or pale yellow (unless your doctor has told you not to).  Use a cool mist machine (vaporizer).  If you use oxygen or a machine that turns liquid medicine into a mist (nebulizer), continue to use them as told.  Keep up with shots (vaccinations) as told by your doctor.  Exercise regularly.  Eat healthy foods.  Keep all doctor visits as told. Get help right away if:  You are very short of breath and it gets worse.  You have trouble talking.  You have bad chest pain.  You have blood in your spit (sputum).  You have a fever.  You keep throwing up (vomiting).  You feel weak, or you pass out (faint).  You feel confused.  You keep getting worse. This information is not intended to replace advice given to you by your health care  provider. Make sure you discuss any questions you have with your health care provider. Document Released: 11/03/2011 Document Revised: 04/21/2016 Document Reviewed: 07/19/2013 Elsevier Interactive Patient Education  2017 [employer]    Dysphagia Dysphagia is trouble swallowing. This condition occurs when solids and liquids stick in a person's throat on the way down to the stomach, or when food takes longer to get to the stomach. You may have problems swallowing food, liquids, or both. You may also have pain while trying to swallow. It may take you more time and effort to swallow something. What are the causes? This condition is caused by:  Problems with the muscles. They may make it difficult for you to move food and liquids through the tube that connects your mouth to your stomach (esophagus). You may have ulcers, scar tissue, or inflammation that blocks the normal passage of food and liquids. Causes of these problems include: ? Acid reflux from your stomach into your esophagus (gastroesophageal reflux). ? Infections. ? Radiation treatment for cancer. ? Medicines taken without enough fluids to wash them down into your stomach.  Nerve problems. These prevent signals from being sent to the muscles of your esophagus to squeeze (contract) and move what you swallow down to your stomach.  Globus pharyngeus. This is a common problem that involves feeling like something is stuck in the throat or a sense of trouble with swallowing even though nothing is wrong with the swallowing passages.  Stroke. This  can affect the nerves and make it difficult to swallow.  Certain conditions, such as cerebral palsy or Parkinson disease.  What are the signs or symptoms? Common symptoms of this condition include:  A feeling that solids or liquids are stuck in your throat on the way down to the stomach.  Food taking too long to get to the stomach.  Other symptoms include:  Food moving back from your  stomach to your mouth (regurgitation).  Noises coming from your throat.  Chest discomfort with swallowing.  A feeling of fullness when swallowing.  Drooling, especially when the throat is blocked.  Pain while swallowing.  Heartburn.  Coughing or gagging while trying to swallow.  How is this diagnosed? This condition is diagnosed by:  Barium X-ray. In this test, you swallow a white substance (contrast medium)that sticks to the inside of your esophagus. X-ray images are then taken.  Endoscopy. In this test, a flexible telescope is inserted down your throat to look at your esophagus and your stomach.  CT scans and MRI.  How is this treated? Treatment for dysphagia depends on the cause of the condition:  If the dysphagia is caused by acid reflux or infection, medicines may be used. They may include antibiotics and heartburn medicines.  If the dysphagia is caused by problems with your muscles, swallowing therapy may be used to help you strengthen your swallowing muscles. You may have to do specific exercises to strengthen the muscles or stretch them.  If the dysphagia is caused by a blockage or mass, procedures to remove the blockage may be done. You may need surgery and a feeding tube.  You may need to make diet changes. Ask your health care provider for specific instructions. Follow these instructions at home: Eating and drinking  Try to eat soft food that is easier to swallow.  Follow any diet changes as told by your health care provider.  Cut your food into small pieces and eat slowly.  Eat and drink only when you are sitting upright.  Do not drink alcohol or caffeine. If you need help quitting, ask your health care provider. General instructions  Check your weight every day to make sure you are not losing weight.  Take over-the-counter and prescription medicines only as told by your health care provider.  If you were prescribed an antibiotic medicine, take it as  told by your health care provider. Do not stop taking the antibiotic even if you start to feel better.  Do not use any products that contain nicotine or tobacco, such as cigarettes and e-cigarettes. If you need help quitting, ask your health care provider.  Keep all follow-up visits as told by your health care provider. This is important. Contact a health care provider if:  You lose weight because you cannot swallow.  You cough when you drink liquids (aspiration).  You cough up partially digested food. Get help right away if:  You cannot swallow your saliva.  You have shortness of breath or a fever, or both.  You have a hoarse voice and also have trouble swallowing. Summary  Dysphagia is trouble swallowing. This condition occurs when solids and liquids stick in a person's throat on the way down to the stomach, or when food takes longer to get to the stomach.  Dysphagia has many possible causes and symptoms.  Treatment for dysphagia depends on the cause of the condition. This information is not intended to replace advice given to you by your health care provider. Make sure  you discuss any questions you have with your health care provider. Document Released: 11/11/2000 Document Revised: 11/03/2016 Document Reviewed: 11/03/2016 Elsevier Interactive Patient Education  2017 [employer].

## 2017-05-04 NOTE — Progress Notes (Signed)
Pt is here for 2 month follow up.  His smoking has increased to 1 pk a day.  He has been coughing up phlegm.  Is taking symbicort and simvastatin.

## 2017-05-04 NOTE — Progress Notes (Deleted)
David Woodard is a 66 y.o. male who presents to [facility] today for    Past Medical History:  Diagnosis Date  . COPD with acute bronchitis ([place]) 11/11/2010   Qualifier: Diagnosis of  By: Joya Gaskins MD, Burnett Harry   . Crohn's ileitis ([place]) 09/15/2015   Overview:  GI   . MI (myocardial infarction) [facility])    Past Surgical History:  Procedure Laterality Date  . CORONARY ANGIOPLASTY WITH STENT PLACEMENT     Social History  Substance Use Topics  . Smoking status: Current Every Day Smoker    Packs/day: 1.50    Types: Cigarettes  . Smokeless tobacco: Never Used  . Alcohol use Yes   family history includes Heart disease in his father; Hypertension in his father.  ROS as above:  Medications: Current Outpatient Prescriptions  Medication Sig Dispense Refill  . Adalimumab (HUMIRA PEN [place]) Inject into the skin.    Marland Kitchen albuterol (PROVENTIL HFA;VENTOLIN HFA) 108 (90 Base) MCG/ACT inhaler Inhale 2 puffs into the lungs every 6 (six) hours as needed for wheezing or shortness of breath. 1 Inhaler 2  . aspirin 81 MG tablet Take 81 mg by mouth daily.    Marland Kitchen azithromycin (ZITHROMAX) 250 MG tablet Take 1 tablet (250 mg total) by mouth daily. Take first 2 tablets together, then 1 every day until finished. 6 tablet 0  . budesonide-formoterol (SYMBICORT) 160-4.5 MCG/ACT inhaler Inhale 2 puffs into the lungs 2 (two) times daily. 1 Inhaler 3  . buPROPion (WELLBUTRIN XL) 150 MG 24 hr tablet Take 1 tablet (150 mg total) by mouth every morning. 30 tablet 2  . predniSONE (STERAPRED UNI-PAK 48 TAB) 5 MG (48) TBPK tablet 12 day dosepack po 48 tablet 0  . promethazine (PHENERGAN) 25 MG tablet Take 1 tablet (25 mg total) by mouth every 8 (eight) hours as needed for nausea or vomiting. 30 tablet 1  . simvastatin (ZOCOR) 20 MG tablet Take 1 tablet (20 mg total) by mouth every evening. 90 tablet 0   No  current facility-administered medications for this visit.    No Known Allergies  Health Maintenance Health Maintenance  Topic Date Due  . Hepatitis C Screening  Apr 10, 1951  . TETANUS/TDAP  05/28/1968  . COLONOSCOPY  05/29/1999  . PNA vac Low Risk Adult (1 of 2 - PCV13) 05/28/2014  . INFLUENZA VACCINE  07/29/2017 (Originally 06/28/2017)     Exam:  BP (!) 153/74 (BP Location: Left Arm, Patient Position: Sitting, Cuff Size: Normal)   Pulse 99   Temp 97.7 F (36.5 C) (Oral)   Wt 211 lb (95.7 kg)   SpO2 99%   BMI 30.28 kg/m  Gen: Well NAD HEENT: EOMI,  MMM Lungs: Normal work of breathing. CTABL Heart: RRR no MRG Abd: NABS, Soft. Nondistended, Nontender Exts: Brisk capillary refill, warm and well perfused.    No results found for this or any previous visit (from the past 72 hour(s)). No results found.    Assessment and Plan: 66 y.o. male with ***   Orders Placed This Encounter  Procedures  . DG Chest 2 View    Order Specific Question:   Reason for exam:    Answer:   Cough, assess intra-thoracic pathology    Order Specific Question:   Preferred imaging location?    Answer:   [facility]   Meds ordered this encounter  Medications  . predniSONE (STERAPRED UNI-PAK 48 TAB) 5 MG (48) TBPK tablet  Sig: 12 day dosepack po    Dispense:  48 tablet    Refill:  0  . azithromycin (ZITHROMAX) 250 MG tablet    Sig: Take 1 tablet (250 mg total) by mouth daily. Take first 2 tablets together, then 1 every day until finished.    Dispense:  6 tablet    Refill:  0     Discussed warning signs or symptoms. Please see discharge instructions. Patient expresses understanding.

## 2017-05-05 NOTE — Progress Notes (Signed)
David Woodard is a 66 y.o. male who presents to [place]: [place] today for cough congestion and wheezing. Patient has a history of COPD. He notes worsening breathing symptoms in the last 2 weeks. He denies chest pain. He has been using his albuterol inhaler which do help. He notes the cough is mildly productive. He has his current symptoms are somewhat consistent with history of COPD exacerbation. He has also been using his Symbicort.  Additionally he notes trouble swallowing. He has a history of Crohn's disease. The last several months he's had a sensation that the food gets stuck when he swallows.  Lastly he does sometimes experience palpitations and lightheadedness especially when seated. He has a history of coronary artery disease and has a cardiologist.   Past Medical History:  Diagnosis Date  . COPD with acute bronchitis ([place]) 11/11/2010   Qualifier: Diagnosis of  By: Joya Gaskins MD, Burnett Harry   . Crohn's ileitis ([place]) 09/15/2015   Overview:  GI   . MI (myocardial infarction) [facility])    Past Surgical History:  Procedure Laterality Date  . CORONARY ANGIOPLASTY WITH STENT PLACEMENT     Social History  Substance Use Topics  . Smoking status: Current Every Day Smoker    Packs/day: 1.50    Types: Cigarettes  . Smokeless tobacco: Never Used  . Alcohol use Yes   family history includes Heart disease in his father; Hypertension in his father.  ROS as above:  Medications: Current Outpatient Prescriptions  Medication Sig Dispense Refill  . Adalimumab (HUMIRA PEN [place]) Inject into the skin.    Marland Kitchen albuterol (PROVENTIL HFA;VENTOLIN HFA) 108 (90 Base) MCG/ACT inhaler Inhale 2 puffs into the lungs every 6 (six) hours as needed for wheezing or shortness of breath. 1 Inhaler 2  . aspirin 81 MG tablet Take 81 mg by mouth daily.    Marland Kitchen azithromycin (ZITHROMAX) 250 MG tablet Take 1  tablet (250 mg total) by mouth daily. Take first 2 tablets together, then 1 every day until finished. 6 tablet 0  . budesonide-formoterol (SYMBICORT) 160-4.5 MCG/ACT inhaler Inhale 2 puffs into the lungs 2 (two) times daily. 1 Inhaler 3  . buPROPion (WELLBUTRIN XL) 150 MG 24 hr tablet Take 1 tablet (150 mg total) by mouth every morning. 30 tablet 2  . predniSONE (STERAPRED UNI-PAK 48 TAB) 5 MG (48) TBPK tablet 12 day dosepack po 48 tablet 0  . promethazine (PHENERGAN) 25 MG tablet Take 1 tablet (25 mg total) by mouth every 8 (eight) hours as needed for nausea or vomiting. 30 tablet 1  . simvastatin (ZOCOR) 20 MG tablet Take 1 tablet (20 mg total) by mouth every evening. 90 tablet 0   No current facility-administered medications for this visit.    No Known Allergies  Health Maintenance Health Maintenance  Topic Date Due  . Hepatitis C Screening  July 25, 1951  . TETANUS/TDAP  05/28/1968  . COLONOSCOPY  05/29/1999  . PNA vac Low Risk Adult (1 of 2 - PCV13) 05/28/2014  . INFLUENZA VACCINE  07/29/2017 (Originally 06/28/2017)     Exam:  BP (!) 153/74 (BP Location: Left Arm, Patient Position: Sitting, Cuff Size: Normal)   Pulse 99   Temp 97.7 F (36.5 C) (Oral)   Wt 211 lb (95.7 kg)   SpO2 99%   BMI 30.28 kg/m  Gen: Well NAD HEENT: EOMI,  MMM Lungs: Normal work of breathing. Wheezing and coarse breath sounds present bilaterally Heart: RRR no  MRG Abd: NABS, Soft. Nondistended, Nontender Exts: Brisk capillary refill, warm and well perfused.   Twelve-lead EKG shows normal sinus rhythm at 87 bpm. No ST segment elevation or depression. Occasional supraventricular complexes. No Q waves.   No results found for this or any previous visit (from the past 72 hour(s)). Dg Chest 2 View  Result Date: 05/04/2017 CLINICAL DATA:  Patient states that he has been SOB with cough and phlegm x 2 weeks, states that he just flew home from [place] 4 days ago and started getting SOB with the cough while he was up  in [place], smoker, hx of COPD, hx of MI, no other complaints EXAM: CHEST  2 VIEW COMPARISON:  12/31/2012 FINDINGS: Lungs are mildly hyperinflated. Heart size is normal. There are no focal consolidations or pleural effusions. No pulmonary edema. Degenerative changes are seen in thoracic spine. Patient has had previous posterior and anterior cervical fusion. IMPRESSION: No evidence for acute cardiopulmonary abnormality. The salient findings were discussed with David Woodard on 05/04/2017 at 2:47 pm. Electronically Signed   By: Nolon Nations M.D.   On: 05/04/2017 14:48      Assessment and Plan: 65 y.o. male with  COPD exacerbation. Plan to treat with prednisone and azithromycin and albuterol. Symbicort provided as well. Watchful waiting.  Dysphagia: Patient has Crohn's disease concerning for esophageal stricture. Plan to follow-up with gastrologist for potential EGD.  Palpitations: Patient does have risk. Recommend he follow-up with his cardiologist. Reviewed emergency room precautions.   Orders Placed This Encounter  Procedures  . DG Chest 2 View    Order Specific Question:   Reason for exam:    Answer:   Cough, assess intra-thoracic pathology    Order Specific Question:   Preferred imaging location?    Answer:   [facility]   Meds ordered this encounter  Medications  . predniSONE (STERAPRED UNI-PAK 48 TAB) 5 MG (48) TBPK tablet    Sig: 12 day dosepack po    Dispense:  48 tablet    Refill:  0  . azithromycin (ZITHROMAX) 250 MG tablet    Sig: Take 1 tablet (250 mg total) by mouth daily. Take first 2 tablets together, then 1 every day until finished.    Dispense:  6 tablet    Refill:  0     Discussed warning signs or symptoms. Please see discharge instructions. Patient expresses understanding.  I spent 40 minutes with this patient, greater than 50% was face-to-face time counseling regarding the above diagnosis.

## 2017-05-05 NOTE — Addendum Note (Signed)
Addended by: Cristino Martes on: 05/05/2017 10:33 AM   Modules accepted: Orders

## 2017-05-21 DIAGNOSIS — I252 Old myocardial infarction: Secondary | ICD-10-CM | POA: Diagnosis not present

## 2017-05-21 DIAGNOSIS — Z7982 Long term (current) use of aspirin: Secondary | ICD-10-CM | POA: Diagnosis not present

## 2017-05-21 DIAGNOSIS — R079 Chest pain, unspecified: Secondary | ICD-10-CM | POA: Diagnosis not present

## 2017-05-21 DIAGNOSIS — Z720 Tobacco use: Secondary | ICD-10-CM | POA: Diagnosis not present

## 2017-05-21 DIAGNOSIS — R202 Paresthesia of skin: Secondary | ICD-10-CM | POA: Diagnosis not present

## 2017-05-21 DIAGNOSIS — R0600 Dyspnea, unspecified: Secondary | ICD-10-CM | POA: Diagnosis not present

## 2017-05-21 DIAGNOSIS — R5383 Other fatigue: Secondary | ICD-10-CM | POA: Diagnosis not present

## 2017-05-21 DIAGNOSIS — Z79899 Other long term (current) drug therapy: Secondary | ICD-10-CM | POA: Diagnosis not present

## 2017-05-21 DIAGNOSIS — F17210 Nicotine dependence, cigarettes, uncomplicated: Secondary | ICD-10-CM | POA: Diagnosis not present

## 2017-05-21 DIAGNOSIS — J449 Chronic obstructive pulmonary disease, unspecified: Secondary | ICD-10-CM | POA: Diagnosis not present

## 2017-05-21 DIAGNOSIS — R002 Palpitations: Secondary | ICD-10-CM | POA: Diagnosis not present

## 2017-05-21 LAB — HEPATIC FUNCTION PANEL
ALT: 17 (ref 10–40)
AST: 16 (ref 14–40)
Alkaline Phosphatase: 61 (ref 25–125)
BILIRUBIN, TOTAL: 0.3

## 2017-05-21 LAB — CBC AND DIFFERENTIAL
Hemoglobin: 16 (ref 13.5–17.5)
PLATELETS: 331 (ref 150–399)

## 2017-05-21 LAB — BASIC METABOLIC PANEL
BUN: 9 (ref 4–21)
Creatinine: 1.1 (ref 0.6–1.3)
GLUCOSE: 105
Potassium: 4.3 (ref 3.4–5.3)
SODIUM: 135 — AB (ref 137–147)

## 2017-05-30 DIAGNOSIS — Z955 Presence of coronary angioplasty implant and graft: Secondary | ICD-10-CM | POA: Diagnosis not present

## 2017-05-30 DIAGNOSIS — R55 Syncope and collapse: Secondary | ICD-10-CM | POA: Diagnosis not present

## 2017-05-30 DIAGNOSIS — I252 Old myocardial infarction: Secondary | ICD-10-CM | POA: Diagnosis not present

## 2017-05-30 DIAGNOSIS — R42 Dizziness and giddiness: Secondary | ICD-10-CM | POA: Diagnosis not present

## 2017-05-30 DIAGNOSIS — J449 Chronic obstructive pulmonary disease, unspecified: Secondary | ICD-10-CM | POA: Diagnosis not present

## 2017-05-30 DIAGNOSIS — R002 Palpitations: Secondary | ICD-10-CM | POA: Diagnosis not present

## 2017-05-30 DIAGNOSIS — I2510 Atherosclerotic heart disease of native coronary artery without angina pectoris: Secondary | ICD-10-CM | POA: Diagnosis not present

## 2017-06-05 ENCOUNTER — Ambulatory Visit: Payer: Medicare Other | Admitting: Family Medicine

## 2017-06-05 DIAGNOSIS — Z0189 Encounter for other specified special examinations: Secondary | ICD-10-CM

## 2017-06-07 ENCOUNTER — Ambulatory Visit (INDEPENDENT_AMBULATORY_CARE_PROVIDER_SITE_OTHER): Payer: Medicare Other | Admitting: Family Medicine

## 2017-06-07 ENCOUNTER — Encounter: Payer: Self-pay | Admitting: Family Medicine

## 2017-06-07 VITALS — BP 118/79 | HR 99 | Wt 206.0 lb

## 2017-06-07 DIAGNOSIS — I252 Old myocardial infarction: Secondary | ICD-10-CM | POA: Diagnosis not present

## 2017-06-07 DIAGNOSIS — R002 Palpitations: Secondary | ICD-10-CM

## 2017-06-07 DIAGNOSIS — I2510 Atherosclerotic heart disease of native coronary artery without angina pectoris: Secondary | ICD-10-CM

## 2017-06-07 NOTE — Progress Notes (Signed)
David Woodard is a 66 y.o. male who presents to [facility] today for emergency follow-up.  Patient was seen at [facility] emergency room on June 24. He was complaining of dyspnea and palpitations. He had a largely benign workup. He had a follow-up visit with cardiology on July 3 who suspected palpitations. He was started on 25 mg of atenolol daily. Additionally he has a 30 day event monitor that he is currently doing. He notes that since starting the atenolol he has not had any palpitations. He feels reasonably well with no current chest pain or outpatient. He continues to experience occasional wheezing and shortness of breath attributable to COPD. He continues to use Symbicort twice daily.   Past Medical History:  Diagnosis Date  . COPD with acute bronchitis ([place]) 11/11/2010   Qualifier: Diagnosis of  By: Joya Gaskins MD, Burnett Harry   . Crohn's ileitis ([place]) 09/15/2015   Overview:  GI   . MI (myocardial infarction) [facility])    Past Surgical History:  Procedure Laterality Date  . CORONARY ANGIOPLASTY WITH STENT PLACEMENT     Social History  Substance Use Topics  . Smoking status: Current Every Day Smoker    Packs/day: 1.50    Types: Cigarettes  . Smokeless tobacco: Never Used  . Alcohol use Yes   family history includes Heart disease in his father; Hypertension in his father.  ROS as above:  Medications: Current Outpatient Prescriptions  Medication Sig Dispense Refill  . Adalimumab (HUMIRA PEN [place]) Inject into the skin.    Marland Kitchen albuterol (PROVENTIL HFA;VENTOLIN HFA) 108 (90 Base) MCG/ACT inhaler Inhale 2 puffs into the lungs every 6 (six) hours as needed for wheezing or shortness of breath. 1 Inhaler 2  . aspirin 81 MG tablet Take 81 mg by mouth daily.    Marland Kitchen atenolol (TENORMIN) 25 MG tablet Take 25 mg by mouth.    . budesonide-formoterol (SYMBICORT) 160-4.5 MCG/ACT inhaler  Inhale 2 puffs into the lungs 2 (two) times daily. 1 Inhaler 3  . buPROPion (WELLBUTRIN XL) 150 MG 24 hr tablet Take 1 tablet (150 mg total) by mouth every morning. 30 tablet 2  . promethazine (PHENERGAN) 25 MG tablet Take 1 tablet (25 mg total) by mouth every 8 (eight) hours as needed for nausea or vomiting. 30 tablet 1  . simvastatin (ZOCOR) 20 MG tablet Take 1 tablet (20 mg total) by mouth every evening. 90 tablet 0   No current facility-administered medications for this visit.    No Known Allergies  Health Maintenance Health Maintenance  Topic Date Due  . Hepatitis C Screening  06/15/1951  . TETANUS/TDAP  05/28/1968  . COLONOSCOPY  05/29/1999  . PNA vac Low Risk Adult (1 of 2 - PCV13) 05/28/2014  . INFLUENZA VACCINE  07/29/2017 (Originally 06/28/2017)     Exam:  BP 118/79   Pulse 99   Wt 206 lb (93.4 kg)   SpO2 98%   BMI 29.56 kg/m  Gen: Well NAD HEENT: EOMI,  MMM Lungs: Normal work of breathing. CTABL Heart: RRR no MRG Abd: NABS, Soft. Nondistended, Nontender Exts: Brisk capillary refill, warm and well perfused.  Psych alert and oriented normal speech thought process and affect. No SI or HI expressed.  Depression screen [facility] 2/9 06/07/2017 03/06/2017  Decreased Interest 0 1  Down, Depressed, Hopeless 2 1  PHQ - 2 Score 2 2  Altered sleeping 2 0  Tired, decreased energy 1 0  Change in  appetite 1 0  Feeling bad or failure about yourself  0 0  Trouble concentrating 0 0  Moving slowly or fidgety/restless 0 0  Suicidal thoughts 0 0  PHQ-9 Score 6 2   GAD 7 : Generalized Anxiety Score 06/07/2017  Nervous, Anxious, on Edge 0  Control/stop worrying 1  Worry too much - different things 0  Trouble relaxing 0  Restless 0  Easily annoyed or irritable 1  Afraid - awful might happen 0  Total GAD 7 Score 2      Component Name Value Ref Range  Na 135 (L) 136 - 146 mmol/L  Potassium 4.3 3.7 - 5.4 mmol/L  Cl 97 97 - 108 mmol/L  CO2 26 20 - 32 mmol/L  Glucose 105 (H) 65 -  99 mg/dL  BUN 9 8 - 27 mg/dL  Creatinine 1.08 0.76 - 1.27 mg/dL  Ca 10.0 8.6 - 10.2 mg/dL  ALK PHOS 61 25 - 160 IU/L  T Bili 0.31 0.00 - 1.20 mg/dL  Total Protein 7.2 6.0 - 8.5 gm/dL  Alb 4.2 3.6 - 4.8 gm/dL  GLOBULIN 3.0 1.5 - 4.5 gm/dL  ALBUMIN/GLOBULIN RATIO 1.4 1.1 - 2.5   BUN/CREAT RATIO 8.3 (L) 11.0 - 26.0   ALT 17 0 - 55 IU/L  AST 16 0 - 40 IU/L   (other labs will be abstracted)  XR Chest Ap Portable6/24/2018 [facility] Result Impression  IMPRESSION:  No radiographic acute pulmonary pathology.  Result Narrative  TECHNIQUE:XR CHEST AP PORTABLE   COMPARISON: November 12, 2016.   INDICATION: Male 66 years old presenting with Chest Pain  FINDINGS:  No pneumothorax. Lungs are clear without focal opacity or pleural effusion.   The cardiomediastinal silhouette is normal in size. No acute bone abnormality. Cervical fixation hardware.  Other Result Information  Acute Interface, Incoming Rad Results - 05/21/2017  9:01 PM EDT TECHNIQUE:  XR CHEST AP PORTABLE   COMPARISON:   November 12, 2016.   INDICATION: Male 66 years old presenting with Chest Pain    FINDINGS:  No pneumothorax. Lungs are clear without focal opacity or pleural effusion.   The cardiomediastinal silhouette is normal in size.   No acute bone abnormality. Cervical fixation hardware.   IMPRESSION:  No radiographic acute pulmonary pathology      No results found for this or any previous visit (from the past 49 hour(s)). No results found.    Assessment and Plan: 66 y.o. male with palpitations. I agree with atenolol. Plan to follow-up results of event monitor. Plan to continue COPD management with Symbicort. Recommend smoking cessation. Recheck as previously arranged at the end of August.  I'm doubtful that the symptoms were related to underlying anxiety. He has a normal anxiety questionnaire today. This could possibly be panic attack. We'll keep a keen watch on the  symptoms  Return sooner if needed.   No orders of the defined types were placed in this encounter.  Meds ordered this encounter  Medications  . atenolol (TENORMIN) 25 MG tablet    Sig: Take 25 mg by mouth.     Discussed warning signs or symptoms. Please see discharge instructions. Patient expresses understanding.

## 2017-06-07 NOTE — Patient Instructions (Addendum)
Thank you for coming in today. Continue Atenolol daily.  Keep scheduled appointment on the 30th.  Use the symbicort twice daily.  Keep things then way they are.    Panic Attacks Panic attacks are sudden, short feelings of great fear or discomfort. You may have them for no reason when you are relaxed, when you are uneasy (anxious), or when you are sleeping. Follow these instructions at home:  Take all your medicines as told.  Check with your doctor before starting new medicines.  Keep all doctor visits. Contact a doctor if:  You are not able to take your medicines as told.  Your symptoms do not get better.  Your symptoms get worse. Get help right away if:  Your attacks seem different than your normal attacks.  You have thoughts about hurting yourself or others.  You take panic attack medicine and you have a side effect. This information is not intended to replace advice given to you by your health care provider. Make sure you discuss any questions you have with your health care provider. Document Released: 12/17/2010 Document Revised: 04/21/2016 Document Reviewed: 06/28/2013 Elsevier Interactive Patient Education  2017 [employer].

## 2017-06-30 ENCOUNTER — Encounter: Payer: Self-pay | Admitting: Family Medicine

## 2017-06-30 LAB — BLOOD GAS, ARTERIAL
BASE EXC ART: -1 (ref <=2.0)
HCO3 ART: 23 (ref 18.0–23.0)
pCO2 arterial: 35 (ref 35–48)
pH, Arterial: 7.42 (ref 7.35–7.45)
pO2, Arterial: 39 — AB (ref 83–108)

## 2017-07-27 ENCOUNTER — Ambulatory Visit (INDEPENDENT_AMBULATORY_CARE_PROVIDER_SITE_OTHER): Payer: Medicare Other | Admitting: Family Medicine

## 2017-07-27 ENCOUNTER — Encounter: Payer: Self-pay | Admitting: Family Medicine

## 2017-07-27 VITALS — BP 125/71 | HR 71 | Temp 98.0°F | Wt 203.0 lb

## 2017-07-27 DIAGNOSIS — I252 Old myocardial infarction: Secondary | ICD-10-CM | POA: Diagnosis not present

## 2017-07-27 DIAGNOSIS — H938X3 Other specified disorders of ear, bilateral: Secondary | ICD-10-CM

## 2017-07-27 DIAGNOSIS — I2510 Atherosclerotic heart disease of native coronary artery without angina pectoris: Secondary | ICD-10-CM

## 2017-07-27 DIAGNOSIS — J449 Chronic obstructive pulmonary disease, unspecified: Secondary | ICD-10-CM | POA: Diagnosis not present

## 2017-07-27 NOTE — Patient Instructions (Signed)
Thank you for coming in today. Sounds like things are doing well Continue Symbicort.  Recheck in 2 months or so.

## 2017-07-27 NOTE — Progress Notes (Signed)
David Woodard is a 66 y.o. male who presents to [facility]: [place] today for ear fullness.   Patient reports ear fullness and a sensation of ears being clogged. He reports that this occurs about 1-2 times per week. These symptoms usually occur when he wakes up and are resolved with popping his ears. He has also had a runny nose recently. Patient denies any history of allergies or sinus congestion. Patient denies any fevers. He has not noticed any changes in hearing. He denies any recent swimming.   Nausea: Patient reports that he has not need to use his promethazine for the last 3 weeks. He attributes this to a recent trip to [place] to visit family.   COPD: Patient reports no new episodes of shortness of breath. He cannot recall when he last used his albuterol inhaler, but states it has been for several weeks. Patient reports that he uses Symbicort regularly and needs to refill his prescription for it today.  Patient denies any chest pain, pleuritis, vision changes, headaches, or changes in urination.   Past Medical History:  Diagnosis Date  . COPD with acute bronchitis ([place]) 11/11/2010   Qualifier: Diagnosis of  By: Joya Gaskins MD, Burnett Harry   . Crohn's ileitis ([place]) 09/15/2015   Overview:  GI   . MI (myocardial infarction) [facility])    Past Surgical History:  Procedure Laterality Date  . CORONARY ANGIOPLASTY WITH STENT PLACEMENT     Social History  Substance Use Topics  . Smoking status: Current Every Day Smoker    Packs/day: 1.50    Types: Cigarettes  . Smokeless tobacco: Never Used  . Alcohol use Yes   family history includes Heart disease in his father; Hypertension in his father.  ROS as above:  Medications: Current Outpatient Prescriptions  Medication Sig Dispense Refill  . albuterol (PROVENTIL HFA;VENTOLIN HFA) 108 (90 Base) MCG/ACT inhaler Inhale 2 puffs into  the lungs every 6 (six) hours as needed for wheezing or shortness of breath. 1 Inhaler 2  . aspirin 81 MG tablet Take 81 mg by mouth daily.    Marland Kitchen atenolol (TENORMIN) 25 MG tablet Take 25 mg by mouth.    . budesonide-formoterol (SYMBICORT) 160-4.5 MCG/ACT inhaler Inhale 2 puffs into the lungs 2 (two) times daily. 1 Inhaler 3  . promethazine (PHENERGAN) 25 MG tablet Take 1 tablet (25 mg total) by mouth every 8 (eight) hours as needed for nausea or vomiting. 30 tablet 1  . simvastatin (ZOCOR) 20 MG tablet Take 1 tablet (20 mg total) by mouth every evening. 90 tablet 0  . Adalimumab (HUMIRA PEN [place]) Inject into the skin.    Marland Kitchen buPROPion (WELLBUTRIN XL) 150 MG 24 hr tablet Take 1 tablet (150 mg total) by mouth every morning. (Patient not taking: Reported on 07/27/2017) 30 tablet 2   No current facility-administered medications for this visit.    No Known Allergies  Health Maintenance Health Maintenance  Topic Date Due  . Hepatitis C Screening  [birth year]  . COLONOSCOPY  05/29/1999  . INFLUENZA VACCINE  07/27/2018 (Originally 06/28/2017)  . TETANUS/TDAP  07/27/2018 (Originally 05/28/1968)  . PNA vac Low Risk Adult (1 of 2 - PCV13) 07/27/2018 (Originally 05/28/2014)     Exam:  BP 125/71   Pulse 71   Temp 98 F (36.7 C) (Oral)   Wt 203 lb (92.1 kg)   SpO2 99%   BMI 29.13 kg/m  Gen: Well NAD, sitting comfortably in  chair HEENT: EOMI,  MMM, no nasal discharge appreciated, tympanic membranes are clear bilaterally without bulging or drainage, cone of light present bilaterally Lungs: Normal work of breathing. Decreased breath sounds throughout Heart: RRR, normal S1 and S2, no MRG Abd: NABS, Soft. Nondistended, Nontender Exts: Brisk capillary refill, warm and well perfused.    No results found for this or any previous visit (from the past 72 hour(s)). No results found.    Assessment and Plan: 66 y.o. male with ear fullness. This is most likely a viral infection or allergies. No treatment is  indicated at this time.   Patient should continue to use promethazine as needed.  COPD is well-controlled with Symbicort. He should continue to use this and watch for COPD exacerbations. He will return to clinic if he has any recurrence or acute worsening of his symptoms.    No orders of the defined types were placed in this encounter.  No orders of the defined types were placed in this encounter.    Discussed warning signs or symptoms. Please see discharge instructions. Patient expresses understanding.

## 2017-09-19 DIAGNOSIS — R197 Diarrhea, unspecified: Secondary | ICD-10-CM | POA: Diagnosis not present

## 2017-09-19 DIAGNOSIS — I252 Old myocardial infarction: Secondary | ICD-10-CM | POA: Diagnosis not present

## 2017-09-19 DIAGNOSIS — I2510 Atherosclerotic heart disease of native coronary artery without angina pectoris: Secondary | ICD-10-CM | POA: Diagnosis not present

## 2017-09-19 DIAGNOSIS — Z86010 Personal history of colonic polyps: Secondary | ICD-10-CM | POA: Diagnosis not present

## 2017-09-19 DIAGNOSIS — K50019 Crohn's disease of small intestine with unspecified complications: Secondary | ICD-10-CM | POA: Diagnosis not present

## 2017-10-30 ENCOUNTER — Ambulatory Visit (INDEPENDENT_AMBULATORY_CARE_PROVIDER_SITE_OTHER): Payer: Medicare Other | Admitting: Family Medicine

## 2017-10-30 ENCOUNTER — Encounter: Payer: Self-pay | Admitting: Family Medicine

## 2017-10-30 VITALS — BP 132/78 | HR 74 | Ht 70.0 in | Wt 202.0 lb

## 2017-10-30 DIAGNOSIS — I252 Old myocardial infarction: Secondary | ICD-10-CM

## 2017-10-30 DIAGNOSIS — K5000 Crohn's disease of small intestine without complications: Secondary | ICD-10-CM

## 2017-10-30 DIAGNOSIS — J449 Chronic obstructive pulmonary disease, unspecified: Secondary | ICD-10-CM | POA: Diagnosis not present

## 2017-10-30 DIAGNOSIS — I2510 Atherosclerotic heart disease of native coronary artery without angina pectoris: Secondary | ICD-10-CM | POA: Diagnosis not present

## 2017-10-30 DIAGNOSIS — F17210 Nicotine dependence, cigarettes, uncomplicated: Secondary | ICD-10-CM | POA: Diagnosis not present

## 2017-10-30 DIAGNOSIS — R1013 Epigastric pain: Secondary | ICD-10-CM | POA: Diagnosis not present

## 2017-10-30 MED ORDER — OMEPRAZOLE 40 MG PO CPDR: 40.0000 mg | DELAYED_RELEASE_CAPSULE | Freq: Every day | ORAL | 3 refills | Status: DC

## 2017-10-30 NOTE — Progress Notes (Signed)
David Woodard is a 66 y.o. male who presents to [place] today for breathing. David Woodard has COPD that is currently managed by Spiriva. He does not have to use albuterol frequently. He does have a cough occasionally. He continues to smoke 1- 1.5 packs of cigarettes daily.    He also continues to have abdominal pain and diarrhea due to Crohn's ileitis. He was previously taking Humira however he has been off this medicine for the last several months. He notes that he continues to have occasionally bouts of abdominal pain but feels mostly the same. He does note some gastric reflux and occasional regurgitation. He does not take any medications for this. He denies fevers or chills.   Past Medical History:  Diagnosis Date  . COPD with acute bronchitis ([place]) 11/11/2010   Qualifier: Diagnosis of  By: Joya Gaskins MD, Burnett Harry   . Crohn's ileitis ([place]) 09/15/2015   Overview:  GI   . MI (myocardial infarction) [facility])    Past Surgical History:  Procedure Laterality Date  . CORONARY ANGIOPLASTY WITH STENT PLACEMENT     Social History   Tobacco Use  . Smoking status: Current Every Day Smoker    Packs/day: 1.50    Types: Cigarettes  . Smokeless tobacco: Never Used  Substance Use Topics  . Alcohol use: Yes   family history includes Heart disease in his father; Hypertension in his father.  ROS as above:  Medications: Current Outpatient Medications  Medication Sig Dispense Refill  . albuterol (PROVENTIL HFA;VENTOLIN HFA) 108 (90 Base) MCG/ACT inhaler Inhale 2 puffs into the lungs every 6 (six) hours as needed for wheezing or shortness of breath. 1 Inhaler 2  . aspirin 81 MG tablet Take 81 mg by mouth daily.    Marland Kitchen atenolol (TENORMIN) 25 MG tablet Take 25 mg by mouth.    . budesonide-formoterol (SYMBICORT) 160-4.5 MCG/ACT inhaler Inhale 2 puffs into the lungs 2 (two) times daily. 1  Inhaler 3  . promethazine (PHENERGAN) 25 MG tablet Take 1 tablet (25 mg total) by mouth every 8 (eight) hours as needed for nausea or vomiting. 30 tablet 1  . simvastatin (ZOCOR) 20 MG tablet Take 1 tablet (20 mg total) by mouth every evening. 90 tablet 0  . omeprazole (PRILOSEC) 40 MG capsule Take 1 capsule (40 mg total) by mouth daily. 30 capsule 3   No current facility-administered medications for this visit.    No Known Allergies  Health Maintenance Health Maintenance  Topic Date Due  . Hepatitis C Screening  04-04-1951  . COLONOSCOPY  05/29/1999  . INFLUENZA VACCINE  07/27/2018 (Originally 06/28/2017)  . TETANUS/TDAP  07/27/2018 (Originally 05/28/1968)  . PNA vac Low Risk Adult (1 of 2 - PCV13) 07/27/2018 (Originally 05/28/2014)     Exam:  BP 132/78   Pulse 74   Ht 5' 10"  (1.778 m)   Wt 202 lb (91.6 kg)   SpO2 96%   BMI 28.98 kg/m  Gen: Well NAD HEENT: EOMI,  MMM Lungs: Normal work of breathing. occasional wheezing present bilaterally Heart: RRR no MRG Abd: NABS, Soft. Nondistended, Nontender Exts: Brisk capillary refill, warm and well perfused.    No results found for this or any previous visit (from the past 72 hour(s)). No results found.    Assessment and Plan: 66 y.o. male with  COPD: Stable. Encouraged smoking cessation continue Spiriva recheck in 2-3 months.  Dyspepsia: Trial of omeprazole. Follow-up with gastroenterology. EGD if  needed. Recheck in 2-3 months.  Crohn's disease: Stable reconsider Humira if needed.  Smoking: Encourage cessation.   No orders of the defined types were placed in this encounter.  Meds ordered this encounter  Medications  . omeprazole (PRILOSEC) 40 MG capsule    Sig: Take 1 capsule (40 mg total) by mouth daily.    Dispense:  30 capsule    Refill:  3     Discussed warning signs or symptoms. Please see discharge instructions. Patient expresses understanding.

## 2017-10-30 NOTE — Patient Instructions (Addendum)
Thank you for coming in today. Continue the inhalers. Follow up with Gastroenterology Recheck with me in 2 months.  Try to cut back to 1/2 pack per day of smoking.

## 2018-01-01 ENCOUNTER — Encounter: Payer: Self-pay | Admitting: Family Medicine

## 2018-01-01 ENCOUNTER — Ambulatory Visit (INDEPENDENT_AMBULATORY_CARE_PROVIDER_SITE_OTHER): Payer: Medicare Other | Admitting: Family Medicine

## 2018-01-01 VITALS — BP 124/76 | HR 84 | Ht 70.5 in | Wt 203.0 lb

## 2018-01-01 DIAGNOSIS — J0100 Acute maxillary sinusitis, unspecified: Secondary | ICD-10-CM | POA: Diagnosis not present

## 2018-01-01 DIAGNOSIS — J449 Chronic obstructive pulmonary disease, unspecified: Secondary | ICD-10-CM

## 2018-01-01 DIAGNOSIS — I2510 Atherosclerotic heart disease of native coronary artery without angina pectoris: Secondary | ICD-10-CM | POA: Diagnosis not present

## 2018-01-01 DIAGNOSIS — I252 Old myocardial infarction: Secondary | ICD-10-CM

## 2018-01-01 DIAGNOSIS — J4489 Other specified chronic obstructive pulmonary disease: Secondary | ICD-10-CM

## 2018-01-01 MED ORDER — BUDESONIDE-FORMOTEROL FUMARATE 160-4.5 MCG/ACT IN AERO: 2.0000 | INHALATION_SPRAY | Freq: Two times a day (BID) | RESPIRATORY_TRACT | 3 refills | Status: DC

## 2018-01-01 NOTE — Patient Instructions (Signed)
Thank you for coming in today. Recheck in 2 months If the sinus pressure is not better we can use prednisone and antibiotics.  Continue symbicort.  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 Consider tetanus and flu shots./  Call or go to the emergency room if you get worse, have trouble breathing, have chest pains, or palpitations.    Sinusitis, Adult Sinusitis is soreness and inflammation of your sinuses. Sinuses are hollow spaces in the bones around your face. They are located:  Around your eyes.  In the middle of your forehead.  Behind your nose.  In your cheekbones.  Your sinuses and nasal passages are lined with a stringy fluid (mucus). Mucus normally drains out of your sinuses. When your nasal tissues get inflamed or swollen, the mucus can get trapped or blocked so air cannot flow through your sinuses. This lets bacteria, viruses, and funguses grow, and that leads to infection. Follow these instructions at home: Medicines  Take, use, or apply over-the-counter and prescription medicines only as told by your doctor. These may include nasal sprays.  If you were prescribed an antibiotic medicine, take it as told by your doctor. Do not stop taking the antibiotic even if you start to feel better. Hydrate and Humidify  Drink enough water to keep your pee (urine) clear or pale yellow.  Use a cool mist humidifier to keep the humidity level in your home above 50%.  Breathe in steam for 10-15 minutes, 3-4 times a day or as told by your doctor. You can do this in the bathroom while a hot shower is running.  Try not to spend time in cool or dry air. Rest  Rest as much as possible.  Sleep with your head raised (elevated).  Make sure to get enough sleep each night. General instructions  Put a warm, moist washcloth on your face 3-4 times a day or as told by your doctor. This will help with discomfort.  Wash your hands often with soap and water. If there is no soap and water, use hand  sanitizer.  Do not smoke. Avoid being around people who are smoking (secondhand smoke).  Keep all follow-up visits as told by your doctor. This is important. Contact a doctor if:  You have a fever.  Your symptoms get worse.  Your symptoms do not get better within 10 days. Get help right away if:  You have a very bad headache.  You cannot stop throwing up (vomiting).  You have pain or swelling around your face or eyes.  You have trouble seeing.  You feel confused.  Your neck is stiff.  You have trouble breathing. This information is not intended to replace advice given to you by your health care provider. Make sure you discuss any questions you have with your health care provider. Document Released: 05/02/2008 Document Revised: 07/10/2016 Document Reviewed: 09/09/2015 Elsevier Interactive Patient Education  [employer].

## 2018-01-01 NOTE — Progress Notes (Signed)
David Woodard is a 67 y.o. male who presents to [place] today for COPD discuss recent nasal congestion and discuss recent CAD.   COPD: David Woodard takes symbicort twice daily and feels pretty well. He has not had a recent exacerbation in a while and notes that he is able to walk further than typical. He continues to smoke.   Sinus Congestion: David Woodard notes a 2 week history of sinus congestion and pressure. He is feeling pretty well with OTC medicines and does not think it is bad enough for ABX or steroids at this time. He denies any fever or chills.   CAD: David Woodard continues ASA and Atenolol and denies any current chest pain or palpitations.    Past Medical History:  Diagnosis Date  . COPD with acute bronchitis ([place]) 11/11/2010   Qualifier: Diagnosis of  By: Joya Gaskins MD, Burnett Harry   . Crohn's ileitis ([place]) 09/15/2015   Overview:  GI   . MI (myocardial infarction) [facility])    Past Surgical History:  Procedure Laterality Date  . CORONARY ANGIOPLASTY WITH STENT PLACEMENT     Social History   Tobacco Use  . Smoking status: Current Every Day Smoker    Packs/day: 1.50    Types: Cigarettes  . Smokeless tobacco: Never Used  Substance Use Topics  . Alcohol use: Yes   family history includes Heart disease in his father; Hypertension in his father.  ROS as above:  Medications: Current Outpatient Medications  Medication Sig Dispense Refill  . albuterol (PROVENTIL HFA;VENTOLIN HFA) 108 (90 Base) MCG/ACT inhaler Inhale 2 puffs into the lungs every 6 (six) hours as needed for wheezing or shortness of breath. 1 Inhaler 2  . aspirin 81 MG tablet Take 81 mg by mouth daily.    Marland Kitchen atenolol (TENORMIN) 25 MG tablet Take 25 mg by mouth.    . budesonide-formoterol (SYMBICORT) 160-4.5 MCG/ACT inhaler Inhale 2 puffs into the lungs 2 (two) times daily. 1 Inhaler 3  . omeprazole  (PRILOSEC) 40 MG capsule Take 1 capsule (40 mg total) by mouth daily. 30 capsule 3  . promethazine (PHENERGAN) 25 MG tablet Take 1 tablet (25 mg total) by mouth every 8 (eight) hours as needed for nausea or vomiting. 30 tablet 1  . simvastatin (ZOCOR) 20 MG tablet Take 1 tablet (20 mg total) by mouth every evening. 90 tablet 0   No current facility-administered medications for this visit.    No Known Allergies  Health Maintenance Health Maintenance  Topic Date Due  . Hepatitis C Screening  1951/02/02  . COLONOSCOPY  05/29/1999  . INFLUENZA VACCINE  07/27/2018 (Originally 06/28/2017)  . TETANUS/TDAP  07/27/2018 (Originally 05/28/1968)  . PNA vac Low Risk Adult (1 of 2 - PCV13) 07/27/2018 (Originally 05/28/2014)     Exam:  BP 124/76   Pulse 84   Ht 5' 10.5" (1.791 m)   Wt 203 lb (92.1 kg)   BMI 28.72 kg/m   Gen: Well NAD HEENT: EOMI,  MMM, clear nasal discharge. Normal posterior pharynx. Normal TM BL.  Lungs: Normal work of breathing. Course breath sounds BL Heart: RRR no MRG Abd: NABS, Soft. Nondistended, Nontender Exts: Brisk capillary refill, warm and well perfused.    No results found for this or any previous visit (from the past 72 hour(s)). No results found.    Assessment and Plan: 67 y.o. male with  COPD: Doing well. Continue current medicines. Recheck in 2 months. Work on smoking  cessation.   Sinusitis.: Likely viral. Plan for watchful waiting.  If not better or if worse will call in Prednisone/ABX.   CAD: Doing well on current medicines. Work on smoking cessation. Recheck in 2 months.    PNA vaccine provided. No orders of the defined types were placed in this encounter.  Meds ordered this encounter  Medications  . budesonide-formoterol (SYMBICORT) 160-4.5 MCG/ACT inhaler    Sig: Inhale 2 puffs into the lungs 2 (two) times daily.    Dispense:  1 Inhaler    Refill:  3     Discussed warning signs or symptoms. Please see discharge instructions. Patient  expresses understanding.

## 2018-01-10 ENCOUNTER — Telehealth: Payer: Self-pay

## 2018-01-10 NOTE — Telephone Encounter (Signed)
David Woodard called and states he is feeling worse. Nasal congestion, wheezing, cough and stuffy ears. Denies fever, chills or sweats. He states he was advised to call back if not better. He states he was to get prednisone added. Please advise.

## 2018-01-11 MED ORDER — AZITHROMYCIN 250 MG PO TABS
250.0000 mg | ORAL_TABLET | Freq: Every day | ORAL | 0 refills | Status: DC
Start: 2018-01-11 — End: 2018-02-12

## 2018-01-11 MED ORDER — PREDNISONE 50 MG PO TABS: 50.0000 mg | ORAL_TABLET | Freq: Every day | ORAL | 0 refills | Status: DC

## 2018-01-11 NOTE — Telephone Encounter (Signed)
Prednisone and azithromycin sent to [facility].  Return to clinic if not better.

## 2018-01-11 NOTE — Telephone Encounter (Signed)
Left message advising of new prescriptions.

## 2018-02-12 ENCOUNTER — Ambulatory Visit (INDEPENDENT_AMBULATORY_CARE_PROVIDER_SITE_OTHER): Payer: Medicare Other | Admitting: Family Medicine

## 2018-02-12 ENCOUNTER — Encounter: Payer: Self-pay | Admitting: Family Medicine

## 2018-02-12 VITALS — BP 136/73 | HR 79 | Wt 206.0 lb

## 2018-02-12 DIAGNOSIS — M62838 Other muscle spasm: Secondary | ICD-10-CM

## 2018-02-12 DIAGNOSIS — T391X1A Poisoning by 4-Aminophenol derivatives, accidental (unintentional), initial encounter: Secondary | ICD-10-CM | POA: Diagnosis not present

## 2018-02-12 DIAGNOSIS — I252 Old myocardial infarction: Secondary | ICD-10-CM

## 2018-02-12 DIAGNOSIS — I2510 Atherosclerotic heart disease of native coronary artery without angina pectoris: Secondary | ICD-10-CM | POA: Diagnosis not present

## 2018-02-12 MED ORDER — OXYCODONE HCL 5 MG PO TABS: 5.0000 mg | ORAL_TABLET | Freq: Four times a day (QID) | ORAL | 0 refills | Status: DC | PRN

## 2018-02-12 NOTE — Patient Instructions (Signed)
Thank you for coming in today. Use a heating pad.  Do not take tylenol for a while.  Use oxycodone for severe pain.  Use a TENS unit.  Attend PT.  Come back or go to the emergency room if you notice new weakness new numbness problems walking or bowel or bladder problems.  TENS UNIT: This is helpful for muscle pain and spasm.   Search and Purchase a TENS 7000 2nd edition at  www.tenspros.com or www.[place].com It should be less than $30.     TENS unit instructions: Do not shower or bathe with the unit on Turn the unit off before removing electrodes or batteries If the electrodes lose stickiness add a drop of water to the electrodes after they are disconnected from the unit and place on plastic sheet. If you continued to have difficulty, call the TENS unit company to purchase more electrodes. Do not apply lotion on the skin area prior to use. Make sure the skin is clean and dry as this will help prolong the life of the electrodes. After use, always check skin for unusual red areas, rash or other skin difficulties. If there are any skin problems, does not apply electrodes to the same area. Never remove the electrodes from the unit by pulling the wires. Do not use the TENS unit or electrodes other than as directed. Do not change electrode placement without consultating your therapist or physician. Keep 2 fingers with between each electrode. Wear time ratio is 2:1, on to off times.    For example on for 30 minutes off for 15 minutes and then on for 30 minutes off for 15 minutes    Cervical Strain and Sprain Rehab Ask your health care provider which exercises are safe for you. Do exercises exactly as told by your health care provider and adjust them as directed. It is normal to feel mild stretching, pulling, tightness, or discomfort as you do these exercises, but you should stop right away if you feel sudden pain or your pain gets worse.Do not begin these exercises until told by your  health care provider. Stretching and range of motion exercises These exercises warm up your muscles and joints and improve the movement and flexibility of your neck. These exercises also help to relieve pain, numbness, and tingling. Exercise A: Cervical side bend  1. Using good posture, sit on a stable chair or stand up. 2. Without moving your shoulders, slowly tilt your left / right ear to your shoulder until you feel a stretch in your neck muscles. You should be looking straight ahead. 3. Hold for __________ seconds. 4. Repeat with the other side of your neck. Repeat __________ times. Complete this exercise __________ times a day. Exercise B: Cervical rotation  1. Using good posture, sit on a stable chair or stand up. 2. Slowly turn your head to the side as if you are looking over your left / right shoulder. ? Keep your eyes level with the ground. ? Stop when you feel a stretch along the side and the back of your neck. 3. Hold for __________ seconds. 4. Repeat this by turning to your other side. Repeat __________ times. Complete this exercise __________ times a day. Exercise C: Thoracic extension and pectoral stretch 1. Roll a towel or a small blanket so it is about 4 inches (10 cm) in diameter. 2. Lie down on your back on a firm surface. 3. Put the towel lengthwise, under your spine in the middle of your back. It should  not be not under your shoulder blades. The towel should line up with your spine from your middle back to your lower back. 4. Put your hands behind your head and let your elbows fall out to your sides. 5. Hold for __________ seconds. Repeat __________ times. Complete this exercise __________ times a day. Strengthening exercises These exercises build strength and endurance in your neck. Endurance is the ability to use your muscles for a long time, even after your muscles get tired. Exercise D: Upper cervical flexion, isometric 1. Lie on your back with a thin pillow  behind your head and a small rolled-up towel under your neck. 2. Gently tuck your chin toward your chest and nod your head down to look toward your feet. Do not lift your head off the pillow. 3. Hold for __________ seconds. 4. Release the tension slowly. Relax your neck muscles completely before you repeat this exercise. Repeat __________ times. Complete this exercise __________ times a day. Exercise E: Cervical extension, isometric  1. Stand about 6 inches (15 cm) away from a wall, with your back facing the wall. 2. Place a soft object, about 6-8 inches (15-20 cm) in diameter, between the back of your head and the wall. A soft object could be a small pillow, a ball, or a folded towel. 3. Gently tilt your head back and press into the soft object. Keep your jaw and forehead relaxed. 4. Hold for __________ seconds. 5. Release the tension slowly. Relax your neck muscles completely before you repeat this exercise. Repeat __________ times. Complete this exercise __________ times a day. Posture and body mechanics  Body mechanics refers to the movements and positions of your body while you do your daily activities. Posture is part of body mechanics. Good posture and healthy body mechanics can help to relieve stress in your body's tissues and joints. Good posture means that your spine is in its natural S-curve position (your spine is neutral), your shoulders are pulled back slightly, and your head is not tipped forward. The following are general guidelines for applying improved posture and body mechanics to your everyday activities. Standing  When standing, keep your spine neutral and keep your feet about hip-width apart. Keep a slight bend in your knees. Your ears, shoulders, and hips should line up.  When you do a task in which you stand in one place for a long time, place one foot up on a stable object that is 2-4 inches (5-10 cm) high, such as a footstool. This helps keep your spine  neutral. Sitting   When sitting, keep your spine neutral and your keep feet flat on the floor. Use a footrest, if necessary, and keep your thighs parallel to the floor. Avoid rounding your shoulders, and avoid tilting your head forward.  When working at a desk or a computer, keep your desk at a height where your hands are slightly lower than your elbows. Slide your chair under your desk so you are close enough to maintain good posture.  When working at a computer, place your monitor at a height where you are looking straight ahead and you do not have to tilt your head forward or downward to look at the screen. Resting When lying down and resting, avoid positions that are most painful for you. Try to support your neck in a neutral position. You can use a contour pillow or a small rolled-up towel. Your pillow should support your neck but not push on it. This information is not intended to  replace advice given to you by your health care provider. Make sure you discuss any questions you have with your health care provider. Document Released: 11/14/2005 Document Revised: 07/21/2016 Document Reviewed: 10/21/2015 Elsevier Interactive Patient Education  [employer].

## 2018-02-12 NOTE — Progress Notes (Signed)
David Woodard is a 67 y.o. male who presents to [place] today for left lateral neck pain.  Kennet notes a several day history of pain in the left lateral neck without injury.  He denies any radiating pain weakness or numbness.  He notes stiffness and pain.  Pain is worse with activity and better with rest.  He is tried some over-the-counter medications which have helped a bit.  He notes that he has been taking Tylenol in excess of the prescribing dosage.  Estimates that on Saturday (2 days ago) he took 12 or 16 extra strength Tylenols and he took about 8 yesterday.  He denies any abdominal pain.   Past Medical History:  Diagnosis Date  . COPD with acute bronchitis ([place]) 11/11/2010   Qualifier: Diagnosis of  By: Joya Gaskins MD, Burnett Harry   . Crohn's ileitis ([place]) 09/15/2015   Overview:  GI   . MI (myocardial infarction) [facility])    Past Surgical History:  Procedure Laterality Date  . CERVICAL FUSION    . CORONARY ANGIOPLASTY WITH STENT PLACEMENT     Social History   Tobacco Use  . Smoking status: Current Every Day Smoker    Packs/day: 1.50    Types: Cigarettes  . Smokeless tobacco: Never Used  Substance Use Topics  . Alcohol use: Yes     ROS:  As above   Medications: Current Outpatient Medications  Medication Sig Dispense Refill  . albuterol (PROVENTIL HFA;VENTOLIN HFA) 108 (90 Base) MCG/ACT inhaler Inhale 2 puffs into the lungs every 6 (six) hours as needed for wheezing or shortness of breath. 1 Inhaler 2  . aspirin 81 MG tablet Take 81 mg by mouth daily.    Marland Kitchen atenolol (TENORMIN) 25 MG tablet Take 25 mg by mouth.    . budesonide-formoterol (SYMBICORT) 160-4.5 MCG/ACT inhaler Inhale 2 puffs into the lungs 2 (two) times daily. 1 Inhaler 3  . omeprazole (PRILOSEC) 40 MG capsule Take 1 capsule (40 mg total) by mouth daily. 30 capsule 3  . promethazine (PHENERGAN) 25 MG tablet Take 1 tablet (25 mg total) by mouth every 8 (eight) hours  as needed for nausea or vomiting. 30 tablet 1  . simvastatin (ZOCOR) 20 MG tablet Take 1 tablet (20 mg total) by mouth every evening. 90 tablet 0  . oxyCODONE (ROXICODONE) 5 MG immediate release tablet Take 1 tablet (5 mg total) by mouth every 6 (six) hours as needed for severe pain. 15 tablet 0   No current facility-administered medications for this visit.    No Known Allergies   Exam:  BP 136/73   Pulse 79   Wt 206 lb (93.4 kg)   BMI 29.14 kg/m  General: Well Developed, well nourished, and in no acute distress.  Neuro/Psych: Alert and oriented x3, extra-ocular muscles intact, able to move all 4 extremities, sensation grossly intact. Skin: Warm and dry, no rashes noted.  Respiratory: Not using accessory muscles, speaking in full sentences, trachea midline.  Cardiovascular: Pulses palpable, no extremity edema. Abdomen: Does not appear distended. MSK:  C-spine: Nontender to spinal midline. Tender to palpation left cervical paraspinal muscle group Decreased neck range of motion due to pain Upper extremity strength reflexes and sensation are equal normal throughout     No results found for this or any previous visit (from the past 48 hour(s)). No results found.    Assessment and Plan: 67 y.o. male with cervical myofascial strain. Plan for relative rest, heating pad, TENS unit, oxycodone  for severe pain.  Additionally will refer to physical therapy.  Avoid NSAIDs given history of Crohn's disease, and Tylenol given recent accidental overdose. Patient researched [facility] Controlled Substance Reporting System. Recheck if not better.   Accidental Tylenol overdose: Likely less than a significantly dangerous range however it is reasonable to proceed with metabolic panel acetaminophen level.  Careful watchful waiting.  Patient educated about Tylenol dosing and safety.    Orders Placed This Encounter  Procedures  . COMPLETE METABOLIC PANEL WITH GFR  . CBC  . Acetaminophen  level  . Ambulatory referral to Physical Therapy    Referral Priority:   Routine    Referral Type:   Physical Medicine    Referral Reason:   Specialty Services Required    Requested Specialty:   Physical Therapy   Meds ordered this encounter  Medications  . oxyCODONE (ROXICODONE) 5 MG immediate release tablet    Sig: Take 1 tablet (5 mg total) by mouth every 6 (six) hours as needed for severe pain.    Dispense:  15 tablet    Refill:  0    Discussed warning signs or symptoms. Please see discharge instructions. Patient expresses understanding.

## 2018-02-13 ENCOUNTER — Ambulatory Visit: Payer: Medicare Other | Admitting: Physical Therapy

## 2018-02-14 ENCOUNTER — Encounter: Payer: Self-pay | Admitting: Rehabilitative and Restorative Service Providers"

## 2018-02-14 ENCOUNTER — Ambulatory Visit (INDEPENDENT_AMBULATORY_CARE_PROVIDER_SITE_OTHER): Payer: Medicare Other | Admitting: Rehabilitative and Restorative Service Providers"

## 2018-02-14 DIAGNOSIS — M542 Cervicalgia: Secondary | ICD-10-CM

## 2018-02-14 DIAGNOSIS — R29898 Other symptoms and signs involving the musculoskeletal system: Secondary | ICD-10-CM | POA: Diagnosis not present

## 2018-02-14 DIAGNOSIS — R293 Abnormal posture: Secondary | ICD-10-CM

## 2018-02-14 NOTE — Therapy (Addendum)
[place], [place], [address] Phone: [phone]   Fax:  [phone]  Physical Therapy Evaluation  Patient Details  Name: David Woodard MRN: [id number] Date of Birth: 1951/01/25 Referring Provider: Dr Georgina Snell    Encounter Date: 02/14/2018  PT End of Session - 02/14/18 1500    Visit Number  1    Number of Visits  12    Date for PT Re-Evaluation  03/28/18    PT Start Time  0712    PT Stop Time  1522    PT Time Calculation (min)  57 min    Activity Tolerance  Patient tolerated treatment well       Past Medical History:  Diagnosis Date  . COPD with acute bronchitis ([place]) 11/11/2010   Qualifier: Diagnosis of  By: Joya Gaskins MD, Burnett Harry   . Crohn's ileitis ([place]) 09/15/2015   Overview:  GI   . MI (myocardial infarction) [facility])     Past Surgical History:  Procedure Laterality Date  . CERVICAL FUSION    . CORONARY ANGIOPLASTY WITH STENT PLACEMENT      There were no vitals filed for this visit.   Subjective Assessment - 02/14/18 1439    Subjective  Patient reports that he awoke with pain in the Lt neck area 02/09/18. Symptoms continued to increase over the weekend. He was seen by ME Monday 02/12/18 and started with muscle relaxants and pain medication which has not helped much. He continues to have pain and is unable to sleep.     Pertinent History  two neck surgeries ~12 years ago with fusion C 3/4/6 with some continued problems COPD; gall bladder surgery; CTR Rt x 2; Rt RCR; ligamentous surgery Rt elbow; arthritis     Diagnostic tests  xrays     Patient Stated Goals  get rid of a lot of the neck pain     Currently in Pain?  Yes    Pain Score  9     Pain Location  Neck    Pain Orientation  Left;Posterior    Pain Descriptors / Indicators  Sharp;Other (Comment) twitch, hurting     Pain Radiating Towards  into Lt shoulder     Pain Onset  In the past 7 days    Pain Frequency  Constant    Aggravating  Factors   trying to lie down to sleep     Pain Relieving Factors  meds; heating pad; lying back with his head back         [facility] PT Assessment - 02/14/18 0001      Assessment   Medical Diagnosis  Lt cervicalgia    Referring Provider  Dr Georgina Snell     Onset Date/Surgical Date  02/09/18    Hand Dominance  Right    Next MD Visit  03/11/18    Prior Therapy  not for current episode       Precautions   Precautions  None      Balance Screen   Has the patient fallen in the past 6 months  No    Has the patient had a decrease in activity level because of a fear of falling?   No    Is the patient reluctant to leave their home because of a fear of falling?   No      Home Social worker  Private residence    Living Arrangements  Non-relatives/Friends      Prior Function  Level of Independence  Independent    Vocation  Retired;On disability    Vocation Requirements  2002     Leisure  some cooking; sedentary       Observation/Other Assessments   Focus on Therapeutic Outcomes (FOTO)   59% limitation       Sensation   Additional Comments  WFL's per pt report       Posture/Postural Control   Posture Comments  head forward shoulders rounded and elevated; scapulae abducted and rotated along the thoracic wall      AROM   Cervical Flexion  51    Cervical Extension  23 sharp pain Lt neck area     Cervical - Right Side Bend  18    Cervical - Left Side Bend  21    Cervical - Right Rotation  61    Cervical - Left Rotation  11 sharp pain Lt neck area       Strength   Overall Strength Comments  5/5 bilat       Palpation   Spinal mobility  cervical fusion multiple unknown levels; hypersensitive to any palpation     Palpation comment  hypersensitivity to palpation through cervical and shoulder girdle musculature              Objective measurements completed on examination: See above findings.              PT Education - 02/14/18 1459    Education  provided  Yes    Education Details  initiated postural eduction     Person(s) Educated  Patient    Methods  Explanation;Demonstration;Tactile cues;Verbal cues;Handout    Comprehension  Verbalized understanding;Returned demonstration;Verbal cues required;Tactile cues required          PT Long Term Goals - 02/14/18 1629      PT LONG TERM GOAL #1   Title  Improve cervical mobility and ROM by 5-10 degrees in extension and lateral flexion 03/28/18    Time  6    Period  Weeks    Status  New      PT LONG TERM GOAL #2   Title  Decrease pain by 50% allowing patient to return to normal fucntional activities 03/28/18    Time  6    Period  Weeks    Status  New      PT LONG TERM GOAL #3   Title  Improve patient's sleep to prior level 03/28/18    Time  6    Period  Weeks    Status  New      PT LONG TERM GOAL #4   Title  Independent in HEP 03/28/18    Time  6    Period  Weeks    Status  New      PT LONG TERM GOAL #5   Title  Improve FOTO to </= 41% limitation 03/28/18    Time  6    Period  Weeks    Status  New             Plan - 02/14/18 1500    Clinical Impression Statement  David Woodard presents with sudden onset of Lt cervical pain 02/09/18. Symptoms have persisted with minimal improvement with medication, heat and TENS unit at home. He has poor posture and alignment; limited cervical mobility and ROM; significant increased sensitivity to palpation; inability to sleep and pain on a constant basis. Patient will benefit from PT to address problems identifited.  History and Personal Factors relevant to plan of care:  prior cervical fusions/surgeries x 2; arthritic changes; sedentary lifestyle    Clinical Presentation  Evolving    Clinical Presentation due to:  severe pain limiting funational activities and self care     Clinical Decision Making  Moderate    Rehab Potential  Good    PT Frequency  2x / week    PT Duration  6 weeks    PT Treatment/Interventions  Patient/family  education;ADLs/Self Care Home Management;Cryotherapy;Electrical Stimulation;Iontophoresis 56m/ml Dexamethasone;Moist Heat;Ultrasound;Neuromuscular re-education;Dry needling;Manual techniques;Therapeutic activities;Therapeutic exercise    PT Next Visit Plan  review HEP; postural correction; neuromuscular re-education; progress with gentle ROM and active exercise as tolerated; manual work as tolerated; modalities as indicated     Consulted and Agree with Plan of Care  Patient       Patient will benefit from skilled therapeutic intervention in order to improve the following deficits and impairments:  Postural dysfunction, Improper body mechanics, Pain, Increased fascial restricitons, Increased muscle spasms, Hypomobility, Decreased mobility, Decreased range of motion  Visit Diagnosis: Cervicalgia - Plan: PT plan of care cert/re-cert  Other symptoms and signs involving the musculoskeletal system - Plan: PT plan of care cert/re-cert  Abnormal posture - Plan: PT plan of care cert/re-cert     Problem List Patient Active Problem List   Diagnosis Date Noted  . Dyslipidemia 10/31/2016  . Coronary artery disease with history of myocardial infarction without history of CABG 06/14/2016  . Crohn's ileitis ([place] 09/15/2015  . Coronary artery disease involving native coronary artery of native heart without angina pectoris 03/30/2015  . S/P coronary artery stent placement 03/30/2015  . Dependence on nicotine from cigarettes 03/17/2015  . History of intussusception 03/16/2015  . TOBACCO ABUSE 11/11/2010  . COPD (chronic obstructive pulmonary disease) with chronic bronchitis ([place] 11/11/2010    Margarita Bobrowski PNilda SimmerPT, MPH  02/14/2018, 4:37 PM  [facility] [address]Phone: [phone]  Fax:  [phone] Name: CDarryl WillnerMRN: [id number] of Birth: 720-Mar-1952

## 2018-02-14 NOTE — Patient Instructions (Signed)
Axial Extension (Chin Tuck)    Pull chin in and lengthen back of neck. Hold __5__ seconds while counting out loud. Repeat __10__ times. Do __several__ sessions per day.  Shoulder Blade Squeeze   Swim noodle along spine  Rotate shoulders back, then squeeze shoulder blades down and backr. Hold 10 Repeat __10__ times. Do __several__ sessions per day.  Upper Back Strength: Lower Trapezius / Rotator Cuff " L's "     Arms in waitress pose, palms up. Press hands back and slide shoulder blades down. Hold for __5__ seconds. Repeat _10___ times. 1-2 times per day.    Scapular Retraction: Elbow Flexion (Standing)  "W's"     With elbows bent to 90, pinch shoulder blades together and rotate arms out, keeping elbows bent. Repeat __10__ times per set. Do __1-2__ sets per session. Do _several ___ sessions per day.   [facility] [place], [place] [address]  [phone] (office) [phone] (fax)

## 2018-02-15 LAB — COMPLETE METABOLIC PANEL WITH GFR
AG RATIO: 1.4 (calc) (ref 1.0–2.5)
ALT: 11 U/L (ref 9–46)
AST: 11 U/L (ref 10–35)
Albumin: 3.9 g/dL (ref 3.6–5.1)
Alkaline phosphatase (APISO): 56 U/L (ref 40–115)
BILIRUBIN TOTAL: 0.4 mg/dL (ref 0.2–1.2)
BUN: 12 mg/dL (ref 7–25)
CHLORIDE: 103 mmol/L (ref 98–110)
CO2: 29 mmol/L (ref 20–32)
Calcium: 9.3 mg/dL (ref 8.6–10.3)
Creat: 1.18 mg/dL (ref 0.70–1.25)
GFR, Est African American: 73 mL/min/{1.73_m2} (ref >=60)
GFR, Est Non African American: 63 mL/min/{1.73_m2} (ref >=60)
Globulin: 2.8 g/dL (calc) (ref 1.9–3.7)
Glucose, Bld: 101 mg/dL — ABNORMAL HIGH (ref 65–99)
POTASSIUM: 4.5 mmol/L (ref 3.5–5.3)
Sodium: 137 mmol/L (ref 135–146)
Total Protein: 6.7 g/dL (ref 6.1–8.1)

## 2018-02-15 LAB — CBC
HCT: 43.4 % (ref 38.5–50.0)
HEMOGLOBIN: 15 g/dL (ref 13.2–17.1)
MCH: 31.3 pg (ref 27.0–33.0)
MCHC: 34.6 g/dL (ref 32.0–36.0)
MCV: 90.4 fL (ref 80.0–100.0)
MPV: 9.6 fL (ref 7.5–12.5)
Platelets: 323 10*3/uL (ref 140–400)
RBC: 4.8 10*6/uL (ref 4.20–5.80)
RDW: 12.9 % (ref 11.0–15.0)
WBC: 11.5 10*3/uL — ABNORMAL HIGH (ref 3.8–10.8)

## 2018-02-15 LAB — ACETAMINOPHEN LEVEL: Acetaminophen (Tylenol), Serum: <10 mg/L — ABNORMAL LOW (ref 10–20)

## 2018-02-16 ENCOUNTER — Telehealth: Payer: Self-pay

## 2018-02-16 NOTE — Telephone Encounter (Signed)
Noted  

## 2018-02-16 NOTE — Telephone Encounter (Signed)
David Woodard called to give Dr Georgina Snell an update. He states his pain went from a 10/10 to 7/10. The oxycodone, heat and TENS is helping. He is able to sleep and get much needed rest.

## 2018-02-19 ENCOUNTER — Ambulatory Visit (INDEPENDENT_AMBULATORY_CARE_PROVIDER_SITE_OTHER): Payer: Medicare Other | Admitting: Physical Therapy

## 2018-02-19 DIAGNOSIS — R29898 Other symptoms and signs involving the musculoskeletal system: Secondary | ICD-10-CM

## 2018-02-19 DIAGNOSIS — R293 Abnormal posture: Secondary | ICD-10-CM

## 2018-02-19 DIAGNOSIS — M542 Cervicalgia: Secondary | ICD-10-CM | POA: Diagnosis present

## 2018-02-19 NOTE — Therapy (Signed)
[place], [place], [address] Phone: [phone]   Fax:  [phone]  Physical Therapy Treatment  Patient Details  Name: David Woodard MRN: [id number] Date of Birth: [birth year] Referring Provider: Dr. Georgina Snell   Encounter Date: 02/19/2018  PT End of Session - 02/19/18 1438    Visit Number  2    Number of Visits  12    Date for PT Re-Evaluation  03/28/18    PT Start Time  1435    PT Stop Time  1526    PT Time Calculation (min)  51 min    Activity Tolerance  Patient limited by pain    Behavior During Therapy  [facility] for tasks assessed/performed       Past Medical History:  Diagnosis Date  . COPD with acute bronchitis ([place]) 11/11/2010   Qualifier: Diagnosis of  By: Joya Gaskins MD, Burnett Harry   . Crohn's ileitis ([place]) 09/15/2015   Overview:  GI   . MI (myocardial infarction) [facility])     Past Surgical History:  Procedure Laterality Date  . CERVICAL FUSION    . CORONARY ANGIOPLASTY WITH STENT PLACEMENT      There were no vitals filed for this visit.  Subjective Assessment - 02/19/18 1438    Subjective  Pt reports he has a little less pain than last visit.  He is using TENS machine daily.      Patient Stated Goals  get rid of a lot of the neck pain     Currently in Pain?  Yes    Pain Score  8     Pain Location  Neck    Pain Orientation  Left;Posterior;Lateral    Pain Descriptors / Indicators  Aching;Sharp twitching    Aggravating Factors   trying to lie down to sleep.     Pain Relieving Factors  heat, medicine, lying on Rt side          [facility] PT Assessment - 02/19/18 0001      Assessment   Medical Diagnosis  Lt cervicalgia    Referring Provider  Dr. Georgina Snell    Onset Date/Surgical Date  02/09/18    Hand Dominance  Right    Next MD Visit  03/11/18    Prior Therapy  not for current episode        [facility] Adult PT Treatment/Exercise - 02/19/18 0001      Neck Exercises: Seated   Neck Retraction  10  reps;3 secs demo for improved form.    Other Seated Exercise  scap retraction, x 5 sec hold x 10 reps      Shoulder Exercises: Seated   Other Seated Exercises  scap retraction with L's x 10, W's x 10 ( 5 sec holds)      Modalities   Modalities  Electrical Stimulation;Moist Heat      Moist Heat Therapy   Number Minutes Moist Heat  15 Minutes    Moist Heat Location  Shoulder;Cervical (cervical pack over shoulder; in sitting)      Electrical Stimulation   Electrical Stimulation Location  Lt post cervical musculature, upper trap, levator     Electrical Stimulation Action  IFC    Electrical Stimulation Parameters  to tolerance    Electrical Stimulation Goals  Pain;Tone      Manual Therapy   Manual Therapy  Soft tissue mobilization    Manual therapy comments  pt in sitting (unable to tolerate supine or semi-reclined); pt point tender to  touch initially.      Soft tissue mobilization  STM to Lt upper trap, cervical paraspinals, scalene, levator.  light pin and stretch to lateral Lt neck musculature.                    PT Long Term Goals - 02/19/18 1558      PT LONG TERM GOAL #1   Title  Improve cervical mobility and ROM by 5-10 degrees in extension and lateral flexion 03/28/18    Time  6    Period  Weeks    Status  On-going      PT LONG TERM GOAL #2   Title  Decrease pain by 50% allowing patient to return to normal fucntional activities 03/28/18    Time  6    Period  Weeks    Status  On-going      PT LONG TERM GOAL #3   Title  Improve patient's sleep to prior level 03/28/18    Time  6    Period  Weeks    Status  On-going      PT LONG TERM GOAL #4   Title  Independent in HEP 03/28/18    Time  6    Period  Weeks    Status  On-going      PT LONG TERM GOAL #5   Title  Improve FOTO to </= 41% limitation 03/28/18    Time  6    Period  Weeks    Status  On-going            Plan - 02/19/18 1517    Clinical Impression Statement  Pt reported improved pain level since  last visit.  He continues to have very limited cervical ROM, especially Lt lateral flexion and rotation.Pt  unable to tolerated any position other that sitting due to his COPD and neck pain.   Pt reported reduction of pain to 6/10 by end of session.     Rehab Potential  Good    PT Frequency  2x / week    PT Duration  6 weeks    PT Treatment/Interventions  Patient/family education;ADLs/Self Care Home Management;Cryotherapy;Electrical Stimulation;Iontophoresis 36m/ml Dexamethasone;Moist Heat;Ultrasound;Neuromuscular re-education;Dry needling;Manual techniques;Therapeutic activities;Therapeutic exercise    PT Next Visit Plan  continue postural correction; gentle ROM and active exercise as tolerated.  manual therapy and modalities as indicated.     Consulted and Agree with Plan of Care  Patient       Patient will benefit from skilled therapeutic intervention in order to improve the following deficits and impairments:  Postural dysfunction, Improper body mechanics, Pain, Increased fascial restricitons, Increased muscle spasms, Hypomobility, Decreased mobility, Decreased range of motion  Visit Diagnosis: Cervicalgia  Other symptoms and signs involving the musculoskeletal system  Abnormal posture     Problem List Patient Active Problem List   Diagnosis Date Noted  . Dyslipidemia 10/31/2016  . Coronary artery disease with history of myocardial infarction without history of CABG 06/14/2016  . Crohn's ileitis ([place] 09/15/2015  . Coronary artery disease involving native coronary artery of native heart without angina pectoris 03/30/2015  . S/P coronary artery stent placement 03/30/2015  . Dependence on nicotine from cigarettes 03/17/2015  . History of intussusception 03/16/2015  . TOBACCO ABUSE 11/11/2010  . COPD (chronic obstructive pulmonary disease) with chronic bronchitis ([place] 11/11/2010   JKerin Perna PTA 02/19/18 3:59 PM  [place] [address]Phone: [phone]  Fax:  [phone]  Name: David Woodard MRN: [id number] Date of Birth: Jun 16, 1951

## 2018-02-20 ENCOUNTER — Encounter: Payer: Medicare Other | Admitting: Physical Therapy

## 2018-02-21 ENCOUNTER — Ambulatory Visit (INDEPENDENT_AMBULATORY_CARE_PROVIDER_SITE_OTHER): Payer: Medicare Other | Admitting: Physical Therapy

## 2018-02-21 DIAGNOSIS — R293 Abnormal posture: Secondary | ICD-10-CM | POA: Diagnosis not present

## 2018-02-21 DIAGNOSIS — R29898 Other symptoms and signs involving the musculoskeletal system: Secondary | ICD-10-CM | POA: Diagnosis not present

## 2018-02-21 DIAGNOSIS — M542 Cervicalgia: Secondary | ICD-10-CM | POA: Diagnosis present

## 2018-02-21 NOTE — Therapy (Signed)
[place], [place], [address] Phone: [phone]   Fax:  [phone]  Physical Therapy Treatment  Patient Details  Name: David Woodard MRN: [id number] Date of Birth: [birth year] Referring Provider: Dr. Georgina Snell    Encounter Date: 02/21/2018  PT End of Session - 02/21/18 1433    Visit Number  3    Number of Visits  12    Date for PT Re-Evaluation  03/28/18    PT Start Time  1402    PT Stop Time  1435    PT Time Calculation (min)  33 min       Past Medical History:  Diagnosis Date  . COPD with acute bronchitis ([place]) 11/11/2010   Qualifier: Diagnosis of  By: Joya Gaskins MD, Burnett Harry   . Crohn's ileitis ([place]) 09/15/2015   Overview:  GI   . MI (myocardial infarction) [facility])     Past Surgical History:  Procedure Laterality Date  . CERVICAL FUSION    . CORONARY ANGIOPLASTY WITH STENT PLACEMENT      There were no vitals filed for this visit.  Subjective Assessment - 02/21/18 1713    Subjective  Pt reports the pain in his neck is worse again.  "I feel like a have a knot in my neck and it's sticking out".     Pertinent History  two neck surgeries ~12 years ago with fusion C 3/4/6 with some continued problems COPD; gall bladder surgery; CTR Rt x 2; Rt RCR; ligamentous surgery Rt elbow; arthritis     Patient Stated Goals  get rid of a lot of the neck pain     Currently in Pain?  Yes    Pain Score  8     Pain Location  Neck    Pain Orientation  Left;Posterior;Lateral    Pain Descriptors / Indicators  Sharp    Aggravating Factors   any position other than sitting    Pain Relieving Factors  heat, TENS, medicine         [facility] PT Assessment - 02/21/18 0001      Assessment   Medical Diagnosis  Lt cervicalgia    Referring Provider  Dr. Georgina Snell     Onset Date/Surgical Date  02/09/18    Hand Dominance  Right    Next MD Visit  03/11/18        [facility] Adult PT Treatment/Exercise - 02/21/18 0001      Modalities   Modalities  Electrical Stimulation;Ultrasound      Moist Heat Therapy   Number Minutes Moist Heat  -- pt declined; will perform at home.       Theme park manager  Lt upper trap and cervcial Freight forwarder Action  COMBO [place]    Electrical Stimulation Parameters  to tolerance    Electrical Stimulation Goals  Pain;Tone      Ultrasound   Ultrasound Location  Lt upper trap and cervical paraspinals    Ultrasound Parameters  100%, 1.3 w/cm2, 8 min - with COMBO    Ultrasound Goals  Pain;Other (Comment) tone      Manual Therapy   Manual therapy comments  pt in sitting     Soft tissue mobilization  STM to Lt upper trap, cervical paraspinals, scalene, levator.  Edge tool assistance to same area to decrease fascial tightness and pain.          PT Long Term Goals - 02/19/18 1558  PT LONG TERM GOAL #1   Title  Improve cervical mobility and ROM by 5-10 degrees in extension and lateral flexion 03/28/18    Time  6    Period  Weeks    Status  On-going      PT LONG TERM GOAL #2   Title  Decrease pain by 50% allowing patient to return to normal fucntional activities 03/28/18    Time  6    Period  Weeks    Status  On-going      PT LONG TERM GOAL #3   Title  Improve patient's sleep to prior level 03/28/18    Time  6    Period  Weeks    Status  On-going      PT LONG TERM GOAL #4   Title  Independent in HEP 03/28/18    Time  6    Period  Weeks    Status  On-going      PT LONG TERM GOAL #5   Title  Improve FOTO to </= 41% limitation 03/28/18    Time  6    Period  Weeks    Status  On-going            Plan - 02/21/18 1711    Clinical Impression Statement  Pt reported decrease in pain level by 2 points at end of session.  He reported already performing HEP prior to session and planned to use heat and TENS when he returned home.  Continued persistant tightness in Lt neck musculature.  No goals met yet.     PT  Frequency  2x / week    PT Duration  6 weeks    PT Treatment/Interventions  Patient/family education;ADLs/Self Care Home Management;Cryotherapy;Electrical Stimulation;Iontophoresis 62m/ml Dexamethasone;Moist Heat;Ultrasound;Neuromuscular re-education;Dry needling;Manual techniques;Therapeutic activities;Therapeutic exercise    PT Next Visit Plan  continue postural correction; gentle ROM and active exercise as tolerated.  manual therapy and modalities as indicated.        Patient will benefit from skilled therapeutic intervention in order to improve the following deficits and impairments:  Postural dysfunction, Improper body mechanics, Pain, Increased fascial restricitons, Increased muscle spasms, Hypomobility, Decreased mobility, Decreased range of motion  Visit Diagnosis: Cervicalgia  Other symptoms and signs involving the musculoskeletal system  Abnormal posture     Problem List Patient Active Problem List   Diagnosis Date Noted  . Dyslipidemia 10/31/2016  . Coronary artery disease with history of myocardial infarction without history of CABG 06/14/2016  . Crohn's ileitis ([place] 09/15/2015  . Coronary artery disease involving native coronary artery of native heart without angina pectoris 03/30/2015  . S/P coronary artery stent placement 03/30/2015  . Dependence on nicotine from cigarettes 03/17/2015  . History of intussusception 03/16/2015  . TOBACCO ABUSE 11/11/2010  . COPD (chronic obstructive pulmonary disease) with chronic bronchitis ([place] 11/11/2010   JKerin Perna PTA 02/21/18 5:16 PM  [place] [address]Phone: [phone]  Fax:  [phone] Name: David BusheyMRN: [id number] of Birth: 704-Mar-1952

## 2018-02-28 ENCOUNTER — Other Ambulatory Visit: Payer: Self-pay | Admitting: Family Medicine

## 2018-02-28 ENCOUNTER — Ambulatory Visit (INDEPENDENT_AMBULATORY_CARE_PROVIDER_SITE_OTHER): Payer: Medicare Other | Admitting: Physical Therapy

## 2018-02-28 DIAGNOSIS — R29898 Other symptoms and signs involving the musculoskeletal system: Secondary | ICD-10-CM | POA: Diagnosis not present

## 2018-02-28 DIAGNOSIS — M542 Cervicalgia: Secondary | ICD-10-CM

## 2018-02-28 DIAGNOSIS — R293 Abnormal posture: Secondary | ICD-10-CM | POA: Diagnosis not present

## 2018-02-28 DIAGNOSIS — R1013 Epigastric pain: Secondary | ICD-10-CM

## 2018-02-28 NOTE — Therapy (Signed)
[place], [place], [address] Phone: [phone]   Fax:  [phone]  Physical Therapy Treatment  Patient Details  Name: David Woodard MRN: [id number] Date of Birth: 20-Oct-1951 Referring Provider: Dr. Georgina Snell   Encounter Date: 02/28/2018  PT End of Session - 02/28/18 1511    Visit Number  4    Number of Visits  12    Date for PT Re-Evaluation  03/28/18    PT Start Time  4332    PT Stop Time  1555    PT Time Calculation (min)  48 min    Activity Tolerance  Patient tolerated treatment well    Behavior During Therapy  [facility] for tasks assessed/performed       Past Medical History:  Diagnosis Date  . COPD with acute bronchitis ([place]) 11/11/2010   Qualifier: Diagnosis of  By: Joya Gaskins MD, Burnett Harry   . Crohn's ileitis ([place]) 09/15/2015   Overview:  GI   . MI (myocardial infarction) [facility])     Past Surgical History:  Procedure Laterality Date  . CERVICAL FUSION    . CORONARY ANGIOPLASTY WITH STENT PLACEMENT      There were no vitals filed for this visit.  Subjective Assessment - 02/28/18 1556    Subjective  Pt reports improvement since last visit.  He has been using TENS and a lot of heat at home.  He no longer feels like he has a "knot sticking out of his neck".  25-40% improvement reported.     Patient Stated Goals  get rid of a lot of the neck pain     Currently in Pain?  Yes    Pain Score  6     Pain Location  Neck    Pain Orientation  Left;Posterior;Lateral    Pain Descriptors / Indicators  Sharp;Aching;Tightness    Aggravating Factors   any position other than sitting     Pain Relieving Factors  heat, TENS         [facility] PT Assessment - 02/28/18 0001      Assessment   Medical Diagnosis  Lt cervicalgia    Referring Provider  Dr. Georgina Snell    Onset Date/Surgical Date  02/09/18    Hand Dominance  Right    Next MD Visit  03/11/18      AROM   Cervical Extension  40    Cervical - Right Side  Bend  20    Cervical - Left Side Bend  18 with pain    Cervical - Right Rotation  52    Cervical - Left Rotation  40 with pain       [facility] Adult PT Treatment/Exercise - 02/28/18 0001      Neck Exercises: Machines for Strengthening   UBE (Upper Arm Bike)  L1: 1.5 min each direction, standing.       Neck Exercises: Standing   Other Standing Exercises  passive pec stretch with overpressure into shoulders with pt's back against pool noodle x 30 sec x 2 reps       Shoulder Exercises: Standing   Row  Strengthening;Both;10 reps;Theraband 2 sets    Theraband Level (Shoulder Row)  Level 2 (Red)    Row Limitations  fatigue reported during  2nd set    Retraction  Strengthening;Both;10 reps;Theraband    Theraband Level (Shoulder Retraction)  Level 1 (Yellow)      Shoulder Exercises: Stretch   Other Shoulder Stretches  mid level doorway stretch  x 15 sec x 3     Electrical Stimulation   Electrical Stimulation Location  Lt upper trap and cervical paraspinals    Electrical Stimulation Action  COMBO [place]    Electrical Stimulation Parameters  to tolerance    Electrical Stimulation Goals  Tone;Pain      Ultrasound   Ultrasound Location  see estim    Ultrasound Parameters  100%, 1.2w/cm2, 1.86mz, 8 min     Ultrasound Goals  Pain;Other (Comment) tone      Manual Therapy   Manual therapy comments  pt in sitting     Soft tissue mobilization  STM to Lt upper trap, cervical paraspinals, scalene, levator.  Edge tool assistance to same area to decrease fascial tightness and pain.   STM to Lt lateral/posterior paraspinals with active Rt lateral flexion/return to neutral                    PT Long Term Goals - 02/28/18 1602      PT LONG TERM GOAL #1   Title  Improve cervical mobility and ROM by 5-10 degrees in extension and lateral flexion 03/28/18    Time  6    Period  Weeks    Status  Partially Met      PT LONG TERM GOAL #2   Title  Decrease pain by 50% allowing patient to return to  normal fucntional activities 03/28/18    Time  6    Period  Weeks    Status  On-going      PT LONG TERM GOAL #3   Title  Improve patient's sleep to prior level 03/28/18    Time  6    Period  Weeks    Status  On-going      PT LONG TERM GOAL #4   Title  Independent in HEP 03/28/18    Time  6    Period  Weeks    Status  On-going      PT LONG TERM GOAL #5   Title  Improve FOTO to </= 41% limitation 03/28/18    Time  6    Period  Weeks    Status  On-going            Plan - 02/28/18 1550    Clinical Impression Statement  Improved cervical rotation and extension ROM.  Pt reported up to 40% improvement since last visit.  Pt tolerated new exercises well, reporting fatigue and minimal increase in pain.  Reduction of pain reported with manual therapy and combo [place]  Pt has partially met LTG# 1 and progressing towards remaining goals.     Rehab Potential  Good    PT Frequency  2x / week    PT Duration  6 weeks    PT Treatment/Interventions  Patient/family education;ADLs/Self Care Home Management;Cryotherapy;Electrical Stimulation;Iontophoresis 48mml Dexamethasone;Moist Heat;Ultrasound;Neuromuscular re-education;Dry needling;Manual techniques;Therapeutic activities;Therapeutic exercise    PT Next Visit Plan  continue postural correction; gentle ROM and active exercise as tolerated.  manual therapy and modalities as indicated.     Consulted and Agree with Plan of Care  Patient       Patient will benefit from skilled therapeutic intervention in order to improve the following deficits and impairments:  Postural dysfunction, Improper body mechanics, Pain, Increased fascial restricitons, Increased muscle spasms, Hypomobility, Decreased mobility, Decreased range of motion  Visit Diagnosis: Cervicalgia  Other symptoms and signs involving the musculoskeletal system  Abnormal posture     Problem List Patient Active Problem  List   Diagnosis Date Noted  . Dyslipidemia 10/31/2016  . Coronary  artery disease with history of myocardial infarction without history of CABG 06/14/2016  . Crohn's ileitis ([place]) 09/15/2015  . Coronary artery disease involving native coronary artery of native heart without angina pectoris 03/30/2015  . S/P coronary artery stent placement 03/30/2015  . Dependence on nicotine from cigarettes 03/17/2015  . History of intussusception 03/16/2015  . TOBACCO ABUSE 11/11/2010  . COPD (chronic obstructive pulmonary disease) with chronic bronchitis ([place]) 11/11/2010   Kerin Perna, PTA 02/28/18 4:08 PM  [place], [place], [address] Phone: [phone]   Fax:  [phone]  Name: David Woodard MRN: [id number] Date of Birth: 08-31-1951

## 2018-03-01 ENCOUNTER — Encounter: Payer: Self-pay | Admitting: Family Medicine

## 2018-03-01 ENCOUNTER — Ambulatory Visit (INDEPENDENT_AMBULATORY_CARE_PROVIDER_SITE_OTHER): Payer: Medicare Other | Admitting: Family Medicine

## 2018-03-01 VITALS — BP 135/81 | HR 77 | Ht 70.0 in | Wt 205.0 lb

## 2018-03-01 DIAGNOSIS — J449 Chronic obstructive pulmonary disease, unspecified: Secondary | ICD-10-CM

## 2018-03-01 DIAGNOSIS — I252 Old myocardial infarction: Secondary | ICD-10-CM | POA: Diagnosis not present

## 2018-03-01 DIAGNOSIS — M62838 Other muscle spasm: Secondary | ICD-10-CM

## 2018-03-01 DIAGNOSIS — F17200 Nicotine dependence, unspecified, uncomplicated: Secondary | ICD-10-CM | POA: Diagnosis not present

## 2018-03-01 DIAGNOSIS — I2510 Atherosclerotic heart disease of native coronary artery without angina pectoris: Secondary | ICD-10-CM

## 2018-03-01 DIAGNOSIS — K5000 Crohn's disease of small intestine without complications: Secondary | ICD-10-CM | POA: Diagnosis not present

## 2018-03-01 MED ORDER — BUDESONIDE-FORMOTEROL FUMARATE 160-4.5 MCG/ACT IN AERO: 2.0000 | INHALATION_SPRAY | Freq: Two times a day (BID) | RESPIRATORY_TRACT | 0 refills | Status: DC

## 2018-03-01 NOTE — Progress Notes (Signed)
David Woodard is a 67 y.o. male who presents to [facility] [place] today for follow-up neck pain, COPD.  David Woodard was seen 2 weeks ago for neck pain thought to be cervical myofascial pain and strain.  He did receive physical therapy and has significant improvement in symptoms.  He is using occasional over-the-counter medications being careful to avoid excessive acetaminophen.  No radiating pain weakness or numbness.  COPD stable with Symbicort. New SOB or chest pain.  He notes frequent coughing and some wheezing but overall feels pretty well.  He notes that he does not think he is able to quit smoking but is interested in cutting back if possible.   Past Medical History:  Diagnosis Date  . COPD with acute bronchitis ([place]) 11/11/2010   Qualifier: Diagnosis of  By: Joya Gaskins MD, Burnett Harry   . Crohn's ileitis ([place]) 09/15/2015   Overview:  GI   . MI (myocardial infarction) [facility])    Past Surgical History:  Procedure Laterality Date  . CERVICAL FUSION    . CORONARY ANGIOPLASTY WITH STENT PLACEMENT     Social History   Tobacco Use  . Smoking status: Current Every Day Smoker    Packs/day: 1.50    Types: Cigarettes  . Smokeless tobacco: Never Used  Substance Use Topics  . Alcohol use: Yes   family history includes Heart disease in his father; Hypertension in his father.  ROS as above:  Medications: Current Outpatient Medications  Medication Sig Dispense Refill  . albuterol (PROVENTIL HFA;VENTOLIN HFA) 108 (90 Base) MCG/ACT inhaler Inhale 2 puffs into the lungs every 6 (six) hours as needed for wheezing or shortness of breath. 1 Inhaler 2  . aspirin 81 MG tablet Take 81 mg by mouth daily.    Marland Kitchen atenolol (TENORMIN) 25 MG tablet Take 25 mg by mouth.    . budesonide-formoterol (SYMBICORT) 160-4.5 MCG/ACT inhaler Inhale 2 puffs into the lungs 2 (two) times daily. 2 Inhaler 0  .  omeprazole (PRILOSEC) 40 MG capsule TAKE ONE CAPSULE BY MOUTH DAILY 30 capsule 3  . oxyCODONE (ROXICODONE) 5 MG immediate release tablet Take 1 tablet (5 mg total) by mouth every 6 (six) hours as needed for severe pain. 15 tablet 0  . promethazine (PHENERGAN) 25 MG tablet Take 1 tablet (25 mg total) by mouth every 8 (eight) hours as needed for nausea or vomiting. 30 tablet 1  . simvastatin (ZOCOR) 20 MG tablet Take 1 tablet (20 mg total) by mouth every evening. 90 tablet 0   No current facility-administered medications for this visit.    No Known Allergies  Health Maintenance Health Maintenance  Topic Date Due  . Hepatitis C Screening  November 03, 1951  . COLONOSCOPY  05/29/1999  . INFLUENZA VACCINE  07/27/2018 (Originally 06/28/2018)  . TETANUS/TDAP  07/27/2018 (Originally 05/28/1968)  . PNA vac Low Risk Adult (1 of 2 - PCV13) 07/27/2018 (Originally 05/28/2014)     Exam:  BP 135/81   Pulse 77   Ht 5' 10"  (1.778 m)   Wt 205 lb (93 kg)   BMI 29.41 kg/m  Gen: Well NAD HEENT: EOMI,  MMM Lungs: Normal work of breathing.  Coarse breath sounds with prolonged exacerbation bilaterally Heart: RRR no MRG Abd: NABS, Soft. Nondistended, Nontender Exts: Brisk capillary refill, warm and well perfused.  C-spine nontender decreased range of motion upper extremity strength and coordination intact.   No results found for this or any previous visit (from the past  72 hour(s)). No results found.    Assessment and Plan: 67 y.o. male with  C-spine pain: Doing well.  Continue physical therapy and home exercise program.  Recheck in 2 months.  COPD: Stable continue current regimen  Smoking: Recommend cutting back.  Goal for 5-10 cigarettes/day.  Crohn's disease history doing well.  Continue watchful waiting.  No orders of the defined types were placed in this encounter.  Meds ordered this encounter  Medications  . budesonide-formoterol (SYMBICORT) 160-4.5 MCG/ACT inhaler    Sig: Inhale 2 puffs into  the lungs 2 (two) times daily.    Dispense:  2 Inhaler    Refill:  0     Discussed warning signs or symptoms. Please see discharge instructions. Patient expresses understanding.

## 2018-03-01 NOTE — Patient Instructions (Addendum)
Thank you for coming in today. Continue medicine.  Get labs and vaccine records from the [place].  If not we will try to get those vaccines done.   Recheck in 2 months.

## 2018-03-02 ENCOUNTER — Ambulatory Visit (INDEPENDENT_AMBULATORY_CARE_PROVIDER_SITE_OTHER): Payer: Medicare Other | Admitting: Physical Therapy

## 2018-03-02 DIAGNOSIS — R293 Abnormal posture: Secondary | ICD-10-CM

## 2018-03-02 DIAGNOSIS — M542 Cervicalgia: Secondary | ICD-10-CM | POA: Diagnosis present

## 2018-03-02 DIAGNOSIS — R29898 Other symptoms and signs involving the musculoskeletal system: Secondary | ICD-10-CM

## 2018-03-02 NOTE — Therapy (Signed)
[place], [place], [address] Phone: [phone]   Fax:  [phone]  Physical Therapy Treatment  Patient Details  Name: David Woodard MRN: [id number] Date of Birth: 10/21/1951 Referring Provider: Dr. Georgina Snell   Encounter Date: 03/02/2018  PT End of Session - 03/02/18 1403    Visit Number  5    Number of Visits  12    Date for PT Re-Evaluation  03/28/18    PT Start Time  1402    PT Stop Time  1443    PT Time Calculation (min)  41 min    Activity Tolerance  Patient limited by pain    Behavior During Therapy  [facility] for tasks assessed/performed       Past Medical History:  Diagnosis Date  . COPD with acute bronchitis ([place]) 11/11/2010   Qualifier: Diagnosis of  By: Joya Gaskins MD, Burnett Harry   . Crohn's ileitis ([place]) 09/15/2015   Overview:  GI   . MI (myocardial infarction) [facility])     Past Surgical History:  Procedure Laterality Date  . CERVICAL FUSION    . CORONARY ANGIOPLASTY WITH STENT PLACEMENT      There were no vitals filed for this visit.  Subjective Assessment - 03/02/18 1404    Subjective  Pt. reports little change since last visit. Symptoms were improved for a couple of hours after last visit, but eventually returned to previous state.    Currently in Pain?  Yes    Pain Score  6     Pain Location  Neck    Pain Orientation  Left;Posterior;Lateral    Pain Descriptors / Indicators  Sharp;Aching;Tightness    Aggravating Factors   any position other than sitting    Pain Relieving Factors  heat, TENS         [facility] PT Assessment - 03/02/18 0001      Assessment   Medical Diagnosis  Lt cervicalgia    Referring Provider  Dr. Georgina Snell    Onset Date/Surgical Date  02/09/18    Hand Dominance  Right    Next MD Visit  03/11/18        [facility] Adult PT Treatment/Exercise - 03/02/18 0001      Neck Exercises: Machines for Strengthening   UBE (Upper Arm Bike)  L1: 1 min each direction, standing.        Neck Exercises: Standing   Other Standing Exercises  passive pec stretch with overpressure into shoulders with pt's back against pool noodle x 15 sec x 4 reps     Other Standing Exercises  back against noodle: axial ext x 10 sec x 10 reps; scap retraction x 10 sec x 5 reps      Shoulder Exercises: Standing   External Rotation  Strengthening    Extension  Strengthening;Both;10 reps;Theraband    Theraband Level (Shoulder Extension)  Level 1 (Yellow)    Retraction  Strengthening;Left;Theraband;15 reps      Shoulder Exercises: Stretch   Other Shoulder Stretches  mid level doorway stretch x 15 sec x 2 some discomfort in Lt deltoid and low back.       Theme park manager  Lt upper trap and cervical paraspinals    Chartered certified accountant  combo [place]    Electrical Stimulation Parameters  to tolerance    Electrical Stimulation Goals  Tone;Pain      Ultrasound   Ultrasound Location  see estim     Ultrasound Parameters  100%, 1.2 w/cm2, 30mn     Ultrasound Goals  Pain;Other (Comment) tone      Manual Therapy   Manual Therapy  Soft tissue mobilization;Myofascial release    Manual therapy comments  pt in sitting     Soft tissue mobilization  STM to Lt upper trap, cervical paraspinals, scalene, levator.  Edge tool assistance to same area to decrease fascial tightness and pain.   STM to Lt lateral/posterior paraspinals with active Rt lateral flexion/return to neutral      Myofascial Release  MFR to bilat pec major                  PT Long Term Goals - 02/28/18 1602      PT LONG TERM GOAL #1   Title  Improve cervical mobility and ROM by 5-10 degrees in extension and lateral flexion 03/28/18    Time  6    Period  Weeks    Status  Partially Met      PT LONG TERM GOAL #2   Title  Decrease pain by 50% allowing patient to return to normal fucntional activities 03/28/18    Time  6    Period  Weeks    Status  On-going      PT LONG TERM GOAL #3    Title  Improve patient's sleep to prior level 03/28/18    Time  6    Period  Weeks    Status  On-going      PT LONG TERM GOAL #4   Title  Independent in HEP 03/28/18    Time  6    Period  Weeks    Status  On-going      PT LONG TERM GOAL #5   Title  Improve FOTO to </= 41% limitation 03/28/18    Time  6    Period  Weeks    Status  On-going            Plan - 03/02/18 1438    Clinical Impression Statement  Pt reporting slight increase in pain since last visit, likely due to change in weather.  He reported increase in symptoms after band exercises; will hold off on adding those to HEP.  Continued reduction in palpable tightness.  Pt making gradual gains towards goals.     Rehab Potential  Good    PT Frequency  2x / week    PT Duration  6 weeks    PT Treatment/Interventions  Patient/family education;ADLs/Self Care Home Management;Cryotherapy;Electrical Stimulation;Iontophoresis 482mml Dexamethasone;Moist Heat;Ultrasound;Neuromuscular re-education;Dry needling;Manual techniques;Therapeutic activities;Therapeutic exercise    PT Next Visit Plan  continue postural correction; gentle ROM and active exercise as tolerated.  manual therapy and modalities as indicated.     Consulted and Agree with Plan of Care  Patient       Patient will benefit from skilled therapeutic intervention in order to improve the following deficits and impairments:  Postural dysfunction, Improper body mechanics, Pain, Increased fascial restricitons, Increased muscle spasms, Hypomobility, Decreased mobility, Decreased range of motion  Visit Diagnosis: Cervicalgia  Other symptoms and signs involving the musculoskeletal system  Abnormal posture     Problem List Patient Active Problem List   Diagnosis Date Noted  . Dyslipidemia 10/31/2016  . Coronary artery disease with history of myocardial infarction without history of CABG 06/14/2016  . Cro[place]'s ileitis ([facility] [place]/18/2016  . Coronary artery disease involving  native coronary artery of native heart without angina pectoris 03/30/2015  . S/P coronary artery stent placement  03/30/2015  . History of intussusception 03/16/2015  . TOBACCO ABUSE 11/11/2010  . COPD (chronic obstructive pulmonary disease) with chronic bronchitis ([place]) 11/11/2010   Kerin Perna, PTA 03/02/18 3:30 PM  [place], [place], [address] Phone: [phone]   Fax:  [phone]  Name: David Woodard MRN: [id number] Date of Birth: [birth year]

## 2018-03-08 ENCOUNTER — Encounter: Payer: Medicare Other | Admitting: Physical Therapy

## 2018-03-19 ENCOUNTER — Ambulatory Visit (INDEPENDENT_AMBULATORY_CARE_PROVIDER_SITE_OTHER): Payer: Medicare Other | Admitting: Physical Therapy

## 2018-03-19 DIAGNOSIS — R293 Abnormal posture: Secondary | ICD-10-CM

## 2018-03-19 DIAGNOSIS — M542 Cervicalgia: Secondary | ICD-10-CM

## 2018-03-19 DIAGNOSIS — R29898 Other symptoms and signs involving the musculoskeletal system: Secondary | ICD-10-CM

## 2018-03-19 NOTE — Patient Instructions (Signed)

## 2018-03-19 NOTE — Therapy (Signed)
[place], [place], [address] Phone: [phone]   Fax:  [phone]  Physical Therapy Treatment  Patient Details  Name: Esaw Knippel MRN: [id number] Date of Birth: Jun 24, 1951 Referring Provider: Dr. Georgina Snell   Encounter Date: 03/19/2018    Past Medical History:  Diagnosis Date  . COPD with acute bronchitis ([place]) 11/11/2010   Qualifier: Diagnosis of  By: Joya Gaskins MD, Burnett Harry   . Crohn's ileitis ([place]) 09/15/2015   Overview:  GI   . MI (myocardial infarction) [facility])     Past Surgical History:  Procedure Laterality Date  . CERVICAL FUSION    . CORONARY ANGIOPLASTY WITH STENT PLACEMENT      There were no vitals filed for this visit.  Subjective Assessment - 03/19/18 1446    Subjective  Pt arrived stating he doesn't feel well. "I can't seem to get this Crohn's under control".  He reports he is nauseous and has been vomitting over last few days.  He doesn't feel up to session today, but would like to try DN next visit.     Currently in Pain?  Yes    Pain Score  6     Pain Location  Neck    Pain Orientation  Left    Pain Descriptors / Indicators  Aching;Sharp    Aggravating Factors   everything    Pain Relieving Factors  heat, TENS        PT Long Term Goals - 02/28/18 1602      PT LONG TERM GOAL #1   Title  Improve cervical mobility and ROM by 5-10 degrees in extension and lateral flexion 03/28/18    Time  6    Period  Weeks    Status  Partially Met      PT LONG TERM GOAL #2   Title  Decrease pain by 50% allowing patient to return to normal fucntional activities 03/28/18    Time  6    Period  Weeks    Status  On-going      PT LONG TERM GOAL #3   Title  Improve patient's sleep to prior level 03/28/18    Time  6    Period  Weeks    Status  On-going      PT LONG TERM GOAL #4   Title  Independent in HEP 03/28/18    Time  6    Period  Weeks    Status  On-going      PT LONG TERM GOAL #5   Title  Improve FOTO to </= 41% limitation 03/28/18    Time  6    Period  Weeks    Status  On-going            Plan - 03/19/18 1458    Clinical Impression Statement  Therapy session held per pt due to increased nausea.  Pt issued information on DN (for next visit) and more biofreeze samples for pain relief in neck.  Will need to reassess goals and progress next visit since it will have been 3 wks since last visit.     Rehab Potential  Good    PT Frequency  2x / week    PT Duration  6 weeks    PT Treatment/Interventions  Patient/family education;ADLs/Self Care Home Management;Cryotherapy;Electrical Stimulation;Iontophoresis 77m/ml Dexamethasone;Moist Heat;Ultrasound;Neuromuscular re-education;Dry needling;Manual techniques;Therapeutic activities;Therapeutic exercise    PT Next Visit Plan  Reassess ROM and goals. DN/manual therapy.     Consulted and Agree  with Plan of Care  Patient       Patient will benefit from skilled therapeutic intervention in order to improve the following deficits and impairments:  Postural dysfunction, Improper body mechanics, Pain, Increased fascial restricitons, Increased muscle spasms, Hypomobility, Decreased mobility, Decreased range of motion  Visit Diagnosis: Cervicalgia  Other symptoms and signs involving the musculoskeletal system  Abnormal posture     Problem List Patient Active Problem List   Diagnosis Date Noted  . Dyslipidemia 10/31/2016  . Coronary artery disease with history of myocardial infarction without history of CABG 06/14/2016  . Crohn's ileitis ([place]) 09/15/2015  . Coronary artery disease involving native coronary artery of native heart without angina pectoris 03/30/2015  . S/P coronary artery stent placement 03/30/2015  . History of intussusception 03/16/2015  . TOBACCO ABUSE 11/11/2010  . COPD (chronic obstructive pulmonary disease) with chronic bronchitis ([place]) 11/11/2010   Kerin Perna, PTA 03/19/18 3:02 PM  [place], [place], [address] Phone: [phone]   Fax:  [phone]  Name: Dickey Caamano MRN: [id number] Date of Birth: 08-24-1951

## 2018-03-21 ENCOUNTER — Encounter: Payer: Medicare Other | Admitting: Physical Therapy

## 2018-03-26 ENCOUNTER — Ambulatory Visit (INDEPENDENT_AMBULATORY_CARE_PROVIDER_SITE_OTHER): Payer: Medicare Other | Admitting: Rehabilitative and Restorative Service Providers"

## 2018-03-26 ENCOUNTER — Encounter: Payer: Self-pay | Admitting: Rehabilitative and Restorative Service Providers"

## 2018-03-26 DIAGNOSIS — R293 Abnormal posture: Secondary | ICD-10-CM

## 2018-03-26 DIAGNOSIS — M542 Cervicalgia: Secondary | ICD-10-CM | POA: Diagnosis present

## 2018-03-26 DIAGNOSIS — R29898 Other symptoms and signs involving the musculoskeletal system: Secondary | ICD-10-CM | POA: Diagnosis not present

## 2018-03-26 NOTE — Therapy (Addendum)
[place], [place], [address] Phone: [phone]   Fax:  [phone]  Physical Therapy Treatment  Patient Details  Name: David Woodard MRN: [id number] Date of Birth: Mar 12, 1951 Referring Provider: Dr Georgina Snell   Encounter Date: 03/26/2018  PT End of Session - 03/26/18 1429    Visit Number  6    Number of Visits  12    Date for PT Re-Evaluation  03/28/18    PT Start Time  7096    PT Stop Time  1527    PT Time Calculation (min)  58 min    Activity Tolerance  Patient tolerated treatment well       Past Medical History:  Diagnosis Date  . COPD with acute bronchitis ([place]) 11/11/2010   Qualifier: Diagnosis of  By: Joya Gaskins MD, Burnett Harry   . Crohn's ileitis ([place]) 09/15/2015   Overview:  GI   . MI (myocardial infarction) [facility])     Past Surgical History:  Procedure Laterality Date  . CERVICAL FUSION    . CORONARY ANGIOPLASTY WITH STENT PLACEMENT      There were no vitals filed for this visit.  Subjective Assessment - 03/26/18 1431    Subjective  Going on 5 weeks that his neck has been hurting. It is no better. Still has the pain at the same level. Pain is down into the shoulder more. Patient reports that he is much better than when he initially came in for therapy. Just still hurts.     Currently in Pain?  Yes    Pain Score  6     Pain Location  Neck    Pain Orientation  Left    Pain Descriptors / Indicators  Aching;Sharp    Pain Type  Acute pain    Pain Radiating Towards  into the Lt shoulder on top     Pain Onset  1 to 4 weeks ago    Pain Frequency  Constant    Aggravating Factors   everything and anything     Pain Relieving Factors  heat; TENS; biofreeze          [facility] PT Assessment - 03/26/18 0001      Assessment   Medical Diagnosis  Lt cervicalgia    Referring Provider  Dr Georgina Snell    Onset Date/Surgical Date  02/09/18    Hand Dominance  Right    Next MD Visit  03/11/18      AROM   Cervical Flexion  45    Cervical Extension  48    Cervical - Right Side Bend  40    Cervical - Left Side Bend  40 pain Lt lateral neck    Cervical - Right Rotation  63    Cervical - Left Rotation  45 pain Lt lateral cervical       Palpation   Palpation comment  continued increased sensitivity to palpation through cervical and shoulder girdle musculature                    [facility] Adult PT Treatment/Exercise - 03/26/18 0001      Neuro Re-ed    Neuro Re-ed Details   working on cervical posture and alignment with tactile and verbal cues for position of head and neck - pointing out to patient the increased tissue extensibility with proper positioning of head       Neck Exercises: Seated   Other Seated Exercise  scap retraction, x 5 sec  hold x 10 reps    Other Seated Exercise  axial extension several reps with focus on maintaining upright posture and alignment       Moist Heat Therapy   Number Minutes Moist Heat  20 Minutes    Moist Heat Location  Shoulder;Cervical Lt      Electrical Stimulation   Electrical Stimulation Location  Lt cervical and upper thoracic paraspinals; upper trap; leveator     Electrical Stimulation Action  IFC    Electrical Stimulation Parameters  to tolerance     Electrical Stimulation Goals  Tone;Pain      Ultrasound   Ultrasound Location  Lt cervical paraspinals mid to lower cervical and upper thoracic spine to Lt upper trap/leveator     Ultrasound Parameters  1.5 w/cm2; 1 mHz; 100% ; 8 min     Ultrasound Goals  Pain;Other (Comment) tone      Iontophoresis   Type of Iontophoresis  Dexamethasone    Location  Lt cervical paraspinals laterally ~ C5/6/7    Dose  146mmp    Time  12 hours       Manual Therapy   Manual Therapy  Soft tissue mobilization;Myofascial release    Manual therapy comments  pt in sitting     Soft tissue mobilization  Lt posterior lateral cervical musculature; upper trap; leveator    Myofascial Release  bilat pecs; upper  traps              PT Education - 03/26/18 1513    Education provided  Yes    Education Details  HEP DN ionto     Person(s) Educated  Patient    Methods  Explanation;Demonstration;Tactile cues;Verbal cues;Handout    Comprehension  Verbalized understanding;Returned demonstration;Verbal cues required;Tactile cues required          PT Long Term Goals - 03/26/18 1430      PT LONG TERM GOAL #1   Title  Improve cervical mobility and ROM by 5-10 degrees in extension and lateral flexion 03/28/18    Time  6    Status  Achieved      PT LONG TERM GOAL #2   Title  Decrease pain by 50% allowing patient to return to normal fucntional activities 03/28/18    Time  6    Period  Weeks    Status  Partially Met      PT LONG TERM GOAL #3   Title  Improve patient's sleep to prior level 03/28/18    Time  6    Period  Weeks    Status  On-going      PT LONG TERM GOAL #4   Title  Independent in HEP 03/28/18    Time  6    Period  Weeks    Status  On-going      PT LONG TERM GOAL #5   Title  Improve FOTO to </= 41% limitation 03/28/18    Time  6    Period  Weeks    Status  On-going            Plan - 03/26/18 1514    Clinical Impression Statement  Good gains in cervical mobility/ROM with increased tolerance for manual work - palpation through Lt upper quarter. Patient did not tolerate DN - will try ionto to area of tightness and discomfort in the Lt cervical/upper trap area. Continue efforts. Progressing gradually toward goals - accomplished cervical ROM goal.     Rehab Potential  Good  PT Frequency  2x / week    PT Duration  6 weeks    PT Treatment/Interventions  Patient/family education;ADLs/Self Care Home Management;Cryotherapy;Electrical Stimulation;Iontophoresis 8m/ml Dexamethasone;Moist Heat;Ultrasound;Neuromuscular re-education;Dry needling;Manual techniques;Therapeutic activities;Therapeutic exercise    PT Next Visit Plan  manual therapy to pt tolerance; axial extension and  postural work; modalities      Consulted and Agree with Plan of Care  Patient       Patient will benefit from skilled therapeutic intervention in order to improve the following deficits and impairments:  Postural dysfunction, Improper body mechanics, Pain, Increased fascial restricitons, Increased muscle spasms, Hypomobility, Decreased mobility, Decreased range of motion  Visit Diagnosis: Cervicalgia  Other symptoms and signs involving the musculoskeletal system  Abnormal posture     Problem List Patient Active Problem List   Diagnosis Date Noted  . Dyslipidemia 10/31/2016  . Coronary artery disease with history of myocardial infarction without history of CABG 06/14/2016  . Cro[place]'s ileitis ([place] 09/15/2015  . Coronary artery disease involving native coronary artery of native heart without angina pectoris 03/30/2015  . S/P coronary artery stent placement 03/30/2015  . History of intussusception 03/16/2015  . TOBACCO ABUSE 11/11/2010  . COPD (chronic obstructive pulmonary disease) [place] chronic bronchitis ([place] 11/11/2010    Melissa Tomaselli PNilda SimmerPT, MPH  03/26/2018, 3:18 PM  [facility] [address] [address]Phone: [phone]  Fax:  [phone] Name: David FeldnerMRN: [id number] of Birth: 704-18-1952 PHYSICAL THERAPY DISCHARGE SUMMARY  Visits from Start of Care: 6  Current functional level related to goals / functional outcomes: See progress note for discharge status    Remaining deficits: Unknown    Education / Equipment: HEP  Plan: Patient agrees to discharge.  Patient goals were partially met. Patient is being discharged due to the patient's request.  ?????    David Woodard P. HHelene KelpPT, MPH 05/07/18 2:25 PM

## 2018-03-26 NOTE — Patient Instructions (Addendum)
Trigger Point Dry Needling  . What is Trigger Point Dry Needling (DN)? o DN is a physical therapy technique used to treat muscle pain and dysfunction. Specifically, DN helps deactivate muscle trigger points (muscle knots).  o A thin filiform needle is used to penetrate the skin and stimulate the underlying trigger point. The goal is for a local twitch response (LTR) to occur and for the trigger point to relax. No medication of any kind is injected during the procedure.   . What Does Trigger Point Dry Needling Feel Like?  o The procedure feels different for each individual patient. Some patients report that they do not actually feel the needle enter the skin and overall the process is not painful. Very mild bleeding may occur. However, many patients feel a deep cramping in the muscle in which the needle was inserted. This is the local twitch response.   Marland Kitchen How Will I feel after the treatment? o Soreness is normal, and the onset of soreness may not occur for a few hours. Typically this soreness does not last longer than two days.  o Bruising is uncommon, however; ice can be used to decrease any possible bruising.  o In rare cases feeling tired or nauseous after the treatment is normal. In addition, your symptoms may get worse before they get better, this period will typically not last longer than 24 hours.   . What Can I do After My Treatment? o Increase your hydration by drinking more water for the next 24 hours. o You may place ice or heat on the areas treated that have become sore, however, do not use heat on inflamed or bruised areas. Heat often brings more relief post needling. o You can continue your regular activities, but vigorous activity is not recommended initially after the treatment for 24 hours. o DN is best combined with other physical therapy such as strengthening, stretching, and other therapies.    Axial Extension (Chin Tuck)    Pull chin in and lengthen back of neck. Hold  __15-20__ seconds while counting out loud. Repeat _3-5___ times. Do __several__ sessions per day.     IONTOPHORESIS PATIENT PRECAUTIONS & CONTRAINDICATIONS:  . Redness under one or both electrodes can occur.  This characterized by a uniform redness that usually disappears within 12 hours of treatment. . Small pinhead size blisters may result in response to the drug.  Contact your physician if the problem persists more than 24 hours. . On rare occasions, iontophoresis therapy can result in temporary skin reactions such as rash, inflammation, irritation or burns.  The skin reactions may be the result of individual sensitivity to the ionic solution used, the condition of the skin at the start of treatment, reaction to the materials in the electrodes, allergies or sensitivity to dexamethasone, or a poor connection between the patch and your skin.  Discontinue using iontophoresis if you have any of these reactions and report to your therapist. . Remove the Patch or electrodes if you have any undue sensation of pain or burning during the treatment and report discomfort to your therapist. . Tell your Therapist if you have had known adverse reactions to the application of electrical current. . If using the Patch, the LED light will turn off when treatment is complete and the patch can be removed.  Approximate treatment time is 1-3 hours.  Remove the patch when light goes off or after 6 hours. . The Patch can be worn during normal activity, however excessive motion where the electrodes  have been placed can cause poor contact between the skin and the electrode or uneven electrical current resulting in greater risk of skin irritation. Marland Kitchen Keep out of the reach of children.   . DO NOT use if you have a cardiac pacemaker or any other electrically sensitive implanted device. . DO NOT use if you have a known sensitivity to dexamethasone. . DO NOT use during Magnetic Resonance Imaging (MRI). . DO NOT use over  broken or compromised skin (e.g. sunburn, cuts, or acne) due to the increased risk of skin reaction. . DO NOT SHAVE over the area to be treated:  To establish good contact between the Patch and the skin, excessive hair may be clipped. . DO NOT place the Patch or electrodes on or over your eyes, directly over your heart, or brain. . DO NOT reuse the Patch or electrodes as this may cause burns to occur.   Leave patch on for 12 hours

## 2018-03-30 ENCOUNTER — Encounter: Payer: Medicare Other | Admitting: Physical Therapy

## 2018-04-30 ENCOUNTER — Encounter: Payer: Self-pay | Admitting: Family Medicine

## 2018-04-30 ENCOUNTER — Ambulatory Visit (INDEPENDENT_AMBULATORY_CARE_PROVIDER_SITE_OTHER): Payer: Medicare Other

## 2018-04-30 ENCOUNTER — Ambulatory Visit (INDEPENDENT_AMBULATORY_CARE_PROVIDER_SITE_OTHER): Payer: Medicare Other | Admitting: Family Medicine

## 2018-04-30 VITALS — BP 119/68 | HR 79 | Wt 201.0 lb

## 2018-04-30 DIAGNOSIS — R0602 Shortness of breath: Secondary | ICD-10-CM | POA: Diagnosis not present

## 2018-04-30 DIAGNOSIS — R059 Cough, unspecified: Secondary | ICD-10-CM

## 2018-04-30 DIAGNOSIS — I252 Old myocardial infarction: Secondary | ICD-10-CM

## 2018-04-30 DIAGNOSIS — I2510 Atherosclerotic heart disease of native coronary artery without angina pectoris: Secondary | ICD-10-CM | POA: Diagnosis not present

## 2018-04-30 DIAGNOSIS — F17200 Nicotine dependence, unspecified, uncomplicated: Secondary | ICD-10-CM

## 2018-04-30 DIAGNOSIS — R05 Cough: Secondary | ICD-10-CM

## 2018-04-30 DIAGNOSIS — J449 Chronic obstructive pulmonary disease, unspecified: Secondary | ICD-10-CM

## 2018-04-30 DIAGNOSIS — F17210 Nicotine dependence, cigarettes, uncomplicated: Secondary | ICD-10-CM

## 2018-04-30 MED ORDER — BUDESONIDE-FORMOTEROL FUMARATE 160-4.5 MCG/ACT IN AERO: 2.0000 | INHALATION_SPRAY | Freq: Two times a day (BID) | RESPIRATORY_TRACT | 2 refills | Status: DC

## 2018-04-30 MED ORDER — AZITHROMYCIN 250 MG PO TABS
250.0000 mg | ORAL_TABLET | Freq: Every day | ORAL | 0 refills | Status: DC
Start: 2018-04-30 — End: 2018-05-24

## 2018-04-30 MED ORDER — PREDNISONE 10 MG PO TABS: 30.0000 mg | ORAL_TABLET | Freq: Every day | ORAL | 0 refills | Status: DC

## 2018-04-30 MED ORDER — VARENICLINE TARTRATE 0.5 MG X 11 & 1 MG X 42 PO MISC: ORAL | 0 refills | Status: DC

## 2018-04-30 NOTE — Patient Instructions (Addendum)
Thank you for coming in today. Recheck when you return from [place]. Take the prendisone and azithromycin.  Get xray now.   Return sooner if needed.   Get started on Chantix.  After 1 month let me know and I will send in the stable dose.

## 2018-04-30 NOTE — Progress Notes (Signed)
David Woodard is a 67 y.o. male who presents to [facility] today for shortness of breath.   Patient states he has had a "head cold" on and off for two weeks. He reports shortness of breath, productive cough, nasal discharge, and sinus congestion. He states he is using his albuterol and symbicort inhalers more than usual. He denies chest pain but admits coughing hurts. He is producing a lot of mucus. He denies allergies. The inhalers help mildly and OTC cold/flu medicine helps as well.  He notes mild wheezing and mild shortness of breath.  He notes symptoms are consistent with previous episodes of COPD exacerbation.    Additionally he notes that he would like to quit smoking.  He has had success with patches  in the past.  He had some mild benefit from gum but not a lot.  He is interested in Chantix if possible   ROS as above:  Exam:  BP 119/68   Pulse 79   Wt 201 lb (91.2 kg)   BMI 28.84 kg/m  Gen: Well NAD HEENT: EOMI,  MMM Lungs: Normal work of breathing.  Coarse breath sounds with prolonged expiratory phase. Heart: RRR no MRG Abd: NABS, Soft. Nondistended, Nontender Exts: Brisk capillary refill, warm and well perfused.   Lab and Radiology Results No results found for this or any previous visit (from the past 72 hour(s)). Dg Chest 2 View  Result Date: 04/30/2018 CLINICAL DATA:  Two weeks of shortness of breath. History of COPD, previous MI, coronary angioplasty and stent placement. Current smoker. EXAM: CHEST - 2 VIEW COMPARISON:  PA and lateral chest x-ray of May 04, 2017 FINDINGS: The lungs are mildly hyperinflated. The interstitial markings are coarse though stable. There is no alveolar infiltrate or pleural effusion. The heart and pulmonary vascularity are normal. The bony thorax is unremarkable. IMPRESSION: COPD-smoking related changes, stable.  No  pneumonia nor CHF. Electronically Signed   By: David  Martinique M.D.   On: 04/30/2018 13:39  I personally (independently) visualized and performed the interpretation of the images attached in this note.    Assessment and Plan: 67 y.o. male with  COPD exacerbation.  Plan to treat with prednisone and azithromycin.  Continue albuterol.  Smoking: Cessation attempt with Chantix.  Recheck in 2 months.    Orders Placed This Encounter  Procedures  . DG Chest 2 View    Order Specific Question:   Reason for exam:    Answer:   Cough, assess intra-thoracic pathology    Order Specific Question:   Preferred imaging location?    Answer:   [facility]   Meds ordered this encounter  Medications  . budesonide-formoterol (SYMBICORT) 160-4.5 MCG/ACT inhaler    Sig: Inhale 2 puffs into the lungs 2 (two) times daily.    Dispense:  1 Inhaler    Refill:  2  . predniSONE (DELTASONE) 10 MG tablet    Sig: Take 3 tablets (30 mg total) by mouth daily with breakfast.    Dispense:  15 tablet    Refill:  0  . azithromycin (  ZITHROMAX) 250 MG tablet    Sig: Take 1 tablet (250 mg total) by mouth daily. Take first 2 tablets together, then 1 every day until finished.    Dispense:  6 tablet    Refill:  0  . varenicline (CHANTIX STARTING MONTH PAK) 0.5 MG X 11 & 1 MG X 42 tablet    Sig: Take one 0.78m tablet by mouth once daily for 3 days, then increase to one 0.549mtablet twice daily for 3 days, then increase to one 78m42mablet twice daily.    Dispense:  53 tablet    Refill:  0     Historical information moved to improve visibility of documentation.  Past Medical History:  Diagnosis Date  . [place]   Qualifier: Diagnosis of  By: WriJoya Gaskins, PatBurnett Harry. Cro[place]'s ileitis ([facility]/[place]   Overview:  GI   . [facility]  Past Surgical History:  Procedure Laterality Date  . CERVICAL FUSION    . CORONARY ANGIOPLASTY WITH STENT PLACEMENT       Social History   Tobacco Use  . Smoking status: Current Every Day Smoker    Packs/day: 1.50    Types: Cigarettes  . Smokeless tobacco: Never Used  Substance Use Topics  . Alcohol use: Yes   family history includes Heart disease in his father; Hypertension in his father.  Medications: Current Outpatient Medications  Medication Sig Dispense Refill  . albuterol (PROVENTIL HFA;VENTOLIN HFA) 108 (90 Base) MCG/ACT inhaler Inhale 2 puffs into the lungs every 6 (six) hours as needed for wheezing or shortness of breath. 1 Inhaler 2  . aspirin 81 MG tablet Take 81 mg by mouth daily.    . aMarland Kitchenenolol (TENORMIN) 25 MG tablet Take 25 mg by mouth.    . budesonide-formoterol (SYMBICORT) 160-4.5 MCG/ACT inhaler Inhale 2 puffs into the lungs 2 (two) times daily. 1 Inhaler 2  . omeprazole (PRILOSEC) 40 MG capsule TAKE ONE CAPSULE BY MOUTH DAILY 30 capsule 3  . oxyCODONE (ROXICODONE) 5 MG immediate release tablet Take 1 tablet (5 mg total) by mouth every 6 (six) hours as needed for severe pain. 15 tablet 0  . promethazine (PHENERGAN) 25 MG tablet Take 1 tablet (25 mg total) by mouth every 8 (eight) hours as needed for nausea or vomiting. 30 tablet 1  . simvastatin (ZOCOR) 20 MG tablet Take 1 tablet (20 mg total) by mouth every evening. 90 tablet 0  . azithromycin (ZITHROMAX) 250 MG tablet Take 1 tablet (250 mg total) by mouth daily. Take first 2 tablets together, then 1 every day until finished. 6 tablet 0  . predniSONE (DELTASONE) 10 MG tablet Take 3 tablets (30 mg total) by mouth daily with breakfast. 15 tablet 0  . varenicline (CHANTIX STARTING MONTH PAK) 0.5 MG X 11 & 1 MG X 42 tablet Take one 0.5mg18mblet by mouth once daily for 3 days, then increase to one 0.5mg 78mlet twice daily for 3 days, then increase to one 78mg t6met twice daily. 53 tablet 0   No current facility-administered medications for this visit.    No Known Allergies  Health Maintenance Health Maintenance  Topic Date Due  .  Hepatitis C Screening  05/29/19-Feb-1952LONOSCOPY  05/29/1999  . INFLUENZA VACCINE  07/27/2018 (Originally 06/28/2018)  . TETANUS/TDAP  07/27/2018 (Originally 05/28/1968)  . PNA vac Low Risk Adult (1 of 2 - PCV13) 07/27/2018 (Originally 05/28/2014)    Discussed warning  signs or symptoms. Please see discharge instructions. Patient expresses understanding.

## 2018-05-06 DIAGNOSIS — K5090 Crohn's disease, unspecified, without complications: Secondary | ICD-10-CM | POA: Diagnosis not present

## 2018-05-06 DIAGNOSIS — K6389 Other specified diseases of intestine: Secondary | ICD-10-CM | POA: Diagnosis not present

## 2018-05-06 DIAGNOSIS — Z7951 Long term (current) use of inhaled steroids: Secondary | ICD-10-CM | POA: Diagnosis not present

## 2018-05-06 DIAGNOSIS — R1010 Upper abdominal pain, unspecified: Secondary | ICD-10-CM | POA: Diagnosis not present

## 2018-05-06 DIAGNOSIS — Z8679 Personal history of other diseases of the circulatory system: Secondary | ICD-10-CM | POA: Diagnosis not present

## 2018-05-06 DIAGNOSIS — F17210 Nicotine dependence, cigarettes, uncomplicated: Secondary | ICD-10-CM | POA: Diagnosis not present

## 2018-05-06 DIAGNOSIS — K5730 Diverticulosis of large intestine without perforation or abscess without bleeding: Secondary | ICD-10-CM | POA: Diagnosis not present

## 2018-05-06 DIAGNOSIS — Z79899 Other long term (current) drug therapy: Secondary | ICD-10-CM | POA: Diagnosis not present

## 2018-05-06 DIAGNOSIS — K529 Noninfective gastroenteritis and colitis, unspecified: Secondary | ICD-10-CM | POA: Diagnosis not present

## 2018-05-06 DIAGNOSIS — Z7982 Long term (current) use of aspirin: Secondary | ICD-10-CM | POA: Diagnosis not present

## 2018-05-06 DIAGNOSIS — J449 Chronic obstructive pulmonary disease, unspecified: Secondary | ICD-10-CM | POA: Diagnosis not present

## 2018-05-06 DIAGNOSIS — R1013 Epigastric pain: Secondary | ICD-10-CM | POA: Diagnosis not present

## 2018-05-06 DIAGNOSIS — R112 Nausea with vomiting, unspecified: Secondary | ICD-10-CM | POA: Diagnosis not present

## 2018-05-06 DIAGNOSIS — R9341 Abnormal radiologic findings on diagnostic imaging of renal pelvis, ureter, or bladder: Secondary | ICD-10-CM | POA: Diagnosis not present

## 2018-05-10 DIAGNOSIS — K50919 Crohn's disease, unspecified, with unspecified complications: Secondary | ICD-10-CM | POA: Diagnosis not present

## 2018-05-10 DIAGNOSIS — R197 Diarrhea, unspecified: Secondary | ICD-10-CM | POA: Diagnosis not present

## 2018-05-10 DIAGNOSIS — Z720 Tobacco use: Secondary | ICD-10-CM | POA: Diagnosis not present

## 2018-05-10 DIAGNOSIS — R1010 Upper abdominal pain, unspecified: Secondary | ICD-10-CM | POA: Diagnosis not present

## 2018-05-11 DIAGNOSIS — K50919 Crohn's disease, unspecified, with unspecified complications: Secondary | ICD-10-CM | POA: Diagnosis not present

## 2018-05-11 DIAGNOSIS — R197 Diarrhea, unspecified: Secondary | ICD-10-CM | POA: Diagnosis not present

## 2018-05-22 DIAGNOSIS — Z7982 Long term (current) use of aspirin: Secondary | ICD-10-CM | POA: Diagnosis not present

## 2018-05-22 DIAGNOSIS — Z9861 Coronary angioplasty status: Secondary | ICD-10-CM | POA: Diagnosis not present

## 2018-05-22 DIAGNOSIS — J441 Chronic obstructive pulmonary disease with (acute) exacerbation: Secondary | ICD-10-CM | POA: Diagnosis not present

## 2018-05-22 DIAGNOSIS — J439 Emphysema, unspecified: Secondary | ICD-10-CM | POA: Diagnosis not present

## 2018-05-22 DIAGNOSIS — F17210 Nicotine dependence, cigarettes, uncomplicated: Secondary | ICD-10-CM | POA: Diagnosis not present

## 2018-05-22 DIAGNOSIS — Z79899 Other long term (current) drug therapy: Secondary | ICD-10-CM | POA: Diagnosis not present

## 2018-05-22 DIAGNOSIS — R918 Other nonspecific abnormal finding of lung field: Secondary | ICD-10-CM | POA: Diagnosis not present

## 2018-05-22 DIAGNOSIS — I252 Old myocardial infarction: Secondary | ICD-10-CM | POA: Diagnosis not present

## 2018-05-22 DIAGNOSIS — K5090 Crohn's disease, unspecified, without complications: Secondary | ICD-10-CM | POA: Diagnosis not present

## 2018-05-22 DIAGNOSIS — R0789 Other chest pain: Secondary | ICD-10-CM | POA: Diagnosis not present

## 2018-05-22 DIAGNOSIS — K449 Diaphragmatic hernia without obstruction or gangrene: Secondary | ICD-10-CM | POA: Diagnosis not present

## 2018-05-22 DIAGNOSIS — R0602 Shortness of breath: Secondary | ICD-10-CM | POA: Diagnosis not present

## 2018-05-22 DIAGNOSIS — Z981 Arthrodesis status: Secondary | ICD-10-CM | POA: Diagnosis not present

## 2018-05-22 DIAGNOSIS — R079 Chest pain, unspecified: Secondary | ICD-10-CM | POA: Diagnosis not present

## 2018-05-23 DIAGNOSIS — R079 Chest pain, unspecified: Secondary | ICD-10-CM | POA: Diagnosis not present

## 2018-05-23 DIAGNOSIS — J439 Emphysema, unspecified: Secondary | ICD-10-CM | POA: Diagnosis not present

## 2018-05-23 DIAGNOSIS — K449 Diaphragmatic hernia without obstruction or gangrene: Secondary | ICD-10-CM | POA: Diagnosis not present

## 2018-05-24 ENCOUNTER — Ambulatory Visit (INDEPENDENT_AMBULATORY_CARE_PROVIDER_SITE_OTHER): Payer: Medicare Other

## 2018-05-24 ENCOUNTER — Encounter: Payer: Self-pay | Admitting: Family Medicine

## 2018-05-24 ENCOUNTER — Telehealth: Payer: Self-pay | Admitting: Family Medicine

## 2018-05-24 ENCOUNTER — Ambulatory Visit (INDEPENDENT_AMBULATORY_CARE_PROVIDER_SITE_OTHER): Payer: Medicare Other | Admitting: Family Medicine

## 2018-05-24 VITALS — BP 134/78 | HR 86 | Temp 98.4°F | Resp 22 | Ht 70.0 in | Wt 200.0 lb

## 2018-05-24 DIAGNOSIS — R1319 Other dysphagia: Secondary | ICD-10-CM

## 2018-05-24 DIAGNOSIS — Z981 Arthrodesis status: Secondary | ICD-10-CM | POA: Diagnosis not present

## 2018-05-24 DIAGNOSIS — M542 Cervicalgia: Secondary | ICD-10-CM | POA: Diagnosis not present

## 2018-05-24 DIAGNOSIS — I2510 Atherosclerotic heart disease of native coronary artery without angina pectoris: Secondary | ICD-10-CM

## 2018-05-24 DIAGNOSIS — J449 Chronic obstructive pulmonary disease, unspecified: Secondary | ICD-10-CM

## 2018-05-24 DIAGNOSIS — M5412 Radiculopathy, cervical region: Secondary | ICD-10-CM

## 2018-05-24 DIAGNOSIS — I252 Old myocardial infarction: Secondary | ICD-10-CM

## 2018-05-24 DIAGNOSIS — K50018 Crohn's disease of small intestine with other complication: Secondary | ICD-10-CM

## 2018-05-24 DIAGNOSIS — G8929 Other chronic pain: Secondary | ICD-10-CM

## 2018-05-24 DIAGNOSIS — R1310 Dysphagia, unspecified: Secondary | ICD-10-CM

## 2018-05-24 MED ORDER — GABAPENTIN 300 MG PO CAPS: ORAL_CAPSULE | ORAL | 3 refills | Status: DC

## 2018-05-24 NOTE — Telephone Encounter (Signed)
Spoke with digestive health specialist PA.  They will contact patient soon to have a quick follow-up for difficulty swallowing.  Patient should also call digestive health specialists at [phone] tomorrow to get that follow-up appointment.  Let me know if he has trouble getting the appointment.

## 2018-05-24 NOTE — Progress Notes (Signed)
David Woodard is a 67 y.o. male who presents to [place]: [facility] today for neck and arm pain, dysphagia, follow up chest pain episode.   David Woodard was seen in the emergency department on June 25 where he was thought to have a COPD exacerbation.  He did extensive work-up including a negative CT angiogram of his chest, relatively normal EKG and negative cardiac enzymes.  He notes that his initial presenting symptom was left arm pain and numbness and not really much chest symptoms.  He did have wheezing and was prescribed prednisone for this.  He just started taking it yesterday.  He notes his breathing is feeling a bit better.  He denies any significant chest pain.  However he does note dysphasia.  He describes a sensation that his solids get stuck in his throat when he swallowing.  He is able to drink liquids just fine but has trouble with solids.  He notes this is been ongoing for a few days.  He has a pertinent history of Crohn's disease.  He recently was seen by his gastroenterology team on June 13 where he did have some elevated acute phase reactants including mildly elevated CRP.  He notes that he is scheduled for a follow-up colonoscopy in September but is not scheduled for upper endoscopies as he was not having dysphagia symptoms at that time.  Additionally he notes his most pressing issue today is tingling and burning down his left arm.  He describes pain and burning into his left neck left shoulder and numbness and tingling radiating down to his left arm.  He notes symptoms occur across all 5 digits into his left hand.  He denies significant weakness.  He notes symptoms are somewhat consistent with prior history of carpal tunnel syndrome but notes it includes all 5 digits and not just the radial 3 and half digits.  He has a pertinent surgical history for fusion of his cervical spine at  C5-C6-C7 in the 2000's by Dr. Joya Salm.    ROS as above:  Exam:  BP 134/78   Pulse 86   Temp 98.4 F (36.9 C)   Resp (!) 22   Ht 5' 10"  (1.778 m)   Wt 200 lb (90.7 kg)   SpO2 95%   BMI 28.70 kg/m  Gen: Well NAD HEENT: EOMI,  MMM no neck masses palpated Lungs: Normal work of breathing.  Coarse breath sounds and wheezing present bilaterally respiratory rate per my check 15 breaths/min Heart: RRR no MRG Abd: NABS, Soft. Nondistended, Nontender Exts: Brisk capillary refill, warm and well perfused.  C-spine: Decreased range of motion. Tender palpation left cervical paraspinal muscle group Nontender spinal midline.   Upper extremity strength is intact.  Sensation is intact.  Pulses capillary fill are intact.   Lab and Radiology Results X-ray images cervical spine obtained today independent reviewed by me Well-appearing fusion at C5-6 and 7 with degenerative changes present at C4-C5.  No acute fractures or hardware loosening present. Awaiting radiology review.  ED and gastroenterology notes labs and radiology imaging from this month were reviewed available in care everywhere   Assessment and Plan: 67 y.o. male with  Dysphagia: Patient has dysphagia to solids.  This is concerning given his history of Crohn's disease and his increased acute phase reactants on recent labs obtained from [facility].  I discussed the case with the on-call PA at [facility].  They will contact patient to schedule a sooner  follow-up appointment to evaluate need for upper endoscopy in the near future.  Warning signs and symptoms discussed.  Left arm pain: Likely cervical radiculopathy however the nerve roots involved would have to be C5-C8 to include its forearm and all 5 digits.  It is possible He Has Simultaneous Carpal Tunl. and Cubital Tunnel syndrome as well.  He recently was just prescribed prednisone for COPD exacerbation which I think will help cervical symptoms as well.  Plan also prescribed  gabapentin and recheck in the near future if not doing well.  COPD exacerbation: Doing well with prednisone.  Continue current regimen  Chest pain: Patient never really had true chest pain and had a pretty extensive work-up that was reassuring.  He is a follow-up appoint with cardiology on July 5.  I think this is a good idea to keep this appointment.  Orders Placed This Encounter  Procedures  . DG Cervical Spine Complete    Standing Status:   Future    Number of Occurrences:   1    Standing Expiration Date:   07/25/2019    Order Specific Question:   Reason for Exam (SYMPTOM  OR DIAGNOSIS REQUIRED)    Answer:   eval poss left cervical rad    Order Specific Question:   Preferred imaging location?    Answer:   [facility]    Order Specific Question:   Radiology Contrast Protocol - do NOT remove file path    Answer:   [email or web address]   Meds ordered this encounter  Medications  . gabapentin (NEURONTIN) 300 MG capsule    Sig: One tab PO qHS for a week, then BID for a week, then TID. May double weekly to a max of 3,626m/day    Dispense:  180 capsule    Refill:  3     Historical information moved to improve visibility of documentation.  Past Medical History:  Diagnosis Date  . COPD with acute bronchitis ([facility] 11/11/2010   Qualifier: Diagnosis of  By: WJoya GaskinsMD, PBurnett Harry  . [place]'s [place]itis ([facility] 09/15/2015   Overview:  GI   . MI (myocardial infarction) ([facility]    Past Surgical History:  Procedure Laterality Date  . CERVICAL FUSION    . CORONARY ANGIOPLASTY WITH STENT PLACEMENT     Social History   Tobacco Use  . Smoking status: Current Every Day Smoker    Packs/day: 1.50    Types: Cigarettes  . Smokeless tobacco: Never Used  Substance Use Topics  . Alcohol use: Yes   family history includes Heart disease in his father; Hypertension in his father.  Medications: Current Outpatient Medications  Medication Sig Dispense  Refill  . albuterol (PROVENTIL HFA;VENTOLIN HFA) 108 (90 Base) MCG/ACT inhaler Inhale 2 puffs into the lungs every 6 (six) hours as needed for wheezing or shortness of breath. 1 Inhaler 2  . aspirin 81 MG tablet Take 81 mg by mouth daily.    .Marland Kitchenatenolol (TENORMIN) 25 MG tablet Take 25 mg by mouth.    . budesonide-formoterol (SYMBICORT) 160-4.5 MCG/ACT inhaler Inhale 2 puffs into the lungs 2 (two) times daily. 1 Inhaler 2  . gabapentin (NEURONTIN) 300 MG capsule One tab PO qHS for a week, then BID for a week, then TID. May double weekly to a max of 3,6037mday 180 capsule 3  . predniSONE (DELTASONE) 10 MG tablet Take 3 tablets (30 mg total) by mouth daily with breakfast. 15 tablet 0  . promethazine (PHENERGAN) 25 MG tablet Take 1 tablet (  25 mg total) by mouth every 8 (eight) hours as needed for nausea or vomiting. 30 tablet 1   No current facility-administered medications for this visit.    No Known Allergies   Discussed warning signs or symptoms. Please see discharge instructions. Patient expresses understanding.

## 2018-05-24 NOTE — Patient Instructions (Addendum)
Thank you for coming in today. You should see your heart doctor in July 5th.  I will send him a message.  Recheck with me as needed.  Take prendisone course.  Add gabapentin.  Call or go to the emergency room if you get worse, have trouble breathing, have chest pains, or palpitations.   If your belly pain worsens, or you have high fever, bad vomiting, blood in your stool or black tarry stool go to the Emergency Room.   I will get xray results to you soon.    Cervical Radiculopathy Cervical radiculopathy means that a nerve in the neck is pinched or bruised. This can cause pain or loss of feeling (numbness) that runs from your neck to your arm and fingers. Follow these instructions at home: Managing pain  Take over-the-counter and prescription medicines only as told by your doctor.  If directed, put ice on the injured or painful area. ? Put ice in a plastic bag. ? Place a towel between your skin and the bag. ? Leave the ice on for 20 minutes, 2-3 times per day.  If ice does not help, you can try using heat. Take a warm shower or warm bath, or use a heat pack as told by your doctor.  You may try a gentle neck and shoulder massage. Activity  Rest as needed. Follow instructions from your doctor about any activities to avoid.  Do exercises as told by your doctor or physical therapist. General instructions  If you were given a soft collar, wear it as told by your doctor.  Use a flat pillow when you sleep.  Keep all follow-up visits as told by your doctor. This is important. Contact a doctor if:  Your condition does not improve with treatment. Get help right away if:  Your pain gets worse and is not controlled with medicine.  You lose feeling or feel weak in your hand, arm, face, or leg.  You have a fever.  You have a stiff neck.  You cannot control when you poop or pee (have incontinence).  You have trouble with walking, balance, or talking. This information is not  intended to replace advice given to you by your health care provider. Make sure you discuss any questions you have with your health care provider. Document Released: 11/03/2011 Document Revised: 04/21/2016 Document Reviewed: 01/08/2015 Elsevier Interactive Patient Education  [employer].

## 2018-05-25 NOTE — Telephone Encounter (Signed)
Pt advised.

## 2018-05-28 DIAGNOSIS — R1310 Dysphagia, unspecified: Secondary | ICD-10-CM | POA: Diagnosis not present

## 2018-05-28 DIAGNOSIS — R197 Diarrhea, unspecified: Secondary | ICD-10-CM | POA: Diagnosis not present

## 2018-05-28 DIAGNOSIS — K50919 Crohn's disease, unspecified, with unspecified complications: Secondary | ICD-10-CM | POA: Diagnosis not present

## 2018-05-28 DIAGNOSIS — R1010 Upper abdominal pain, unspecified: Secondary | ICD-10-CM | POA: Diagnosis not present

## 2018-06-01 DIAGNOSIS — I2510 Atherosclerotic heart disease of native coronary artery without angina pectoris: Secondary | ICD-10-CM | POA: Diagnosis not present

## 2018-06-01 DIAGNOSIS — Z955 Presence of coronary angioplasty implant and graft: Secondary | ICD-10-CM | POA: Diagnosis not present

## 2018-06-01 DIAGNOSIS — J449 Chronic obstructive pulmonary disease, unspecified: Secondary | ICD-10-CM | POA: Diagnosis not present

## 2018-06-01 DIAGNOSIS — I252 Old myocardial infarction: Secondary | ICD-10-CM | POA: Diagnosis not present

## 2018-06-01 DIAGNOSIS — E785 Hyperlipidemia, unspecified: Secondary | ICD-10-CM | POA: Diagnosis not present

## 2018-07-16 ENCOUNTER — Ambulatory Visit: Payer: Medicare Other | Admitting: Family Medicine

## 2018-07-24 ENCOUNTER — Encounter: Payer: Self-pay | Admitting: Family Medicine

## 2018-07-24 ENCOUNTER — Other Ambulatory Visit: Payer: Self-pay

## 2018-07-24 ENCOUNTER — Ambulatory Visit (INDEPENDENT_AMBULATORY_CARE_PROVIDER_SITE_OTHER): Payer: Medicare Other | Admitting: Family Medicine

## 2018-07-24 VITALS — BP 134/67 | HR 75 | Temp 97.7°F | Ht 70.0 in | Wt 204.0 lb

## 2018-07-24 DIAGNOSIS — F17200 Nicotine dependence, unspecified, uncomplicated: Secondary | ICD-10-CM | POA: Diagnosis not present

## 2018-07-24 DIAGNOSIS — I252 Old myocardial infarction: Secondary | ICD-10-CM

## 2018-07-24 DIAGNOSIS — J441 Chronic obstructive pulmonary disease with (acute) exacerbation: Secondary | ICD-10-CM

## 2018-07-24 DIAGNOSIS — J0100 Acute maxillary sinusitis, unspecified: Secondary | ICD-10-CM

## 2018-07-24 DIAGNOSIS — I2510 Atherosclerotic heart disease of native coronary artery without angina pectoris: Secondary | ICD-10-CM | POA: Diagnosis not present

## 2018-07-24 DIAGNOSIS — J449 Chronic obstructive pulmonary disease, unspecified: Secondary | ICD-10-CM

## 2018-07-24 MED ORDER — PREDNISONE 50 MG PO TABS: 50.0000 mg | ORAL_TABLET | Freq: Every day | ORAL | 0 refills | Status: DC

## 2018-07-24 MED ORDER — BUDESONIDE-FORMOTEROL FUMARATE 160-4.5 MCG/ACT IN AERO: 2.0000 | INHALATION_SPRAY | Freq: Two times a day (BID) | RESPIRATORY_TRACT | 2 refills | Status: DC

## 2018-07-24 MED ORDER — AZITHROMYCIN 250 MG PO TABS: 250.0000 mg | ORAL_TABLET | Freq: Every day | ORAL | 0 refills | Status: DC

## 2018-07-24 MED ORDER — PREDNISONE 10 MG PO TABS: 50.0000 mg | ORAL_TABLET | Freq: Every day | ORAL | 0 refills | Status: DC

## 2018-07-24 NOTE — Patient Instructions (Signed)
Thank you for coming in today. Take both prednisone and azithromycin for 5 days.  Use the inhalers Recheck as needed.   Call or go to the emergency room if you get worse, have trouble breathing, have chest pains, or palpitations.    Chronic Obstructive Pulmonary Disease Exacerbation Chronic obstructive pulmonary disease (COPD) is a common lung problem. In COPD, the flow of air from the lungs is limited. COPD exacerbations are times that breathing gets worse and you need extra treatment. Without treatment they can be life threatening. If they happen often, your lungs can become more damaged. If your COPD gets worse, your doctor may treat you with:  Medicines.  Oxygen.  Different ways to clear your airway, such as using a mask.  Follow these instructions at home:  Do not smoke.  Avoid tobacco smoke and other things that bother your lungs.  If given, take your antibiotic medicine as told. Finish the medicine even if you start to feel better.  Only take medicines as told by your doctor.  Drink enough fluids to keep your pee (urine) clear or pale yellow (unless your doctor has told you not to).  Use a cool mist machine (vaporizer).  If you use oxygen or a machine that turns liquid medicine into a mist (nebulizer), continue to use them as told.  Keep up with shots (vaccinations) as told by your doctor.  Exercise regularly.  Eat healthy foods.  Keep all doctor visits as told. Get help right away if:  You are very short of breath and it gets worse.  You have trouble talking.  You have bad chest pain.  You have blood in your spit (sputum).  You have a fever.  You keep throwing up (vomiting).  You feel weak, or you pass out (faint).  You feel confused.  You keep getting worse. This information is not intended to replace advice given to you by your health care provider. Make sure you discuss any questions you have with your health care provider. Document Released:  11/03/2011 Document Revised: 04/21/2016 Document Reviewed: 07/19/2013 Elsevier Interactive Patient Education  2017 [employer].

## 2018-07-24 NOTE — Progress Notes (Signed)
David Woodard is a 67 y.o. male who presents to [place]: [facility] today for cough congestion runny nose shortness of breath and wheezing.  Symptoms present now for about 4 days.  Patient is worsening.  He has been using his albuterol inhaler which does help.  He ran out of Symbicort about a week ago.  No fevers or chills vomiting or diarrhea.  He is tried some over-the-counter medications which helped a bit.  His symptoms are consistent with previous episodes of COPD exacerbations.   ROS as above:  Exam:  BP 134/67   Pulse 75   Temp 97.7 F (36.5 C) (Oral)   Ht 5' 10"  (1.778 m)   Wt 204 lb (92.5 kg)   SpO2 100%   BMI 29.27 kg/m  Wt Readings from Last 5 Encounters:  07/24/18 204 lb (92.5 kg)  05/24/18 200 lb (90.7 kg)  04/30/18 201 lb (91.2 kg)  03/01/18 205 lb (93 kg)  02/12/18 206 lb (93.4 kg)    Gen: Well NAD HEENT: EOMI,  MMM clear nasal discharge present bilaterally with inflamed nasal turbinates bilaterally.  Posterior pharynx with cobblestoning.  Normal tympanic membranes.  No cervical lymphadenopathy.  Mild tender to palpation maxillary sinuses bilaterally. Lungs: Normal work of breathing.  Prolonged expiratory phase with wheezing bilaterally. Heart: RRR no MRG Abd: NABS, Soft. Nondistended, Nontender Exts: Brisk capillary refill, warm and well perfused.     Assessment and Plan: 67 y.o. male with COPD exacerbation with probable sinusitis.  Empiric treatment with prednisone and azithromycin.  Refill Symbicort  inhaler using samples.  Continue albuterol.  Recheck if not improving in the near future.  Recheck in about a month.   No orders of the defined types were placed in this encounter.  No orders of the defined types were placed in this encounter.    Historical information moved to improve visibility of documentation.  Past Medical History:    Diagnosis Date  . COPD with acute bronchitis ([place]) 11/11/2010   Qualifier: Diagnosis of  By: Joya Gaskins MD, Burnett Harry   . Crohn's ileitis ([place]) 09/15/2015   Overview:  GI   . MI (myocardial infarction) [facility])    Past Surgical History:  Procedure Laterality Date  . CERVICAL FUSION    . CORONARY ANGIOPLASTY WITH STENT PLACEMENT     Social History   Tobacco Use  . Smoking status: Current Every Day Smoker    Packs/day: 1.50    Types: Cigarettes  . Smokeless tobacco: Never Used  Substance Use Topics  . Alcohol use: Yes   family history includes Heart disease in his father; Hypertension in his father.  Medications: Current Outpatient Medications  Medication Sig Dispense Refill  . albuterol (PROVENTIL HFA;VENTOLIN HFA) 108 (90 Base) MCG/ACT inhaler Inhale 2 puffs into the lungs every 6 (six) hours as needed for wheezing or shortness of breath. 1 Inhaler 2  . aspirin 81 MG tablet Take 81 mg by mouth daily.    Marland Kitchen atenolol (TENORMIN) 25 MG tablet Take 25 mg by mouth.    . budesonide-formoterol (SYMBICORT) 160-4.5 MCG/ACT inhaler Inhale 2 puffs into the lungs 2 (two) times daily. 1 Inhaler 2  . gabapentin (NEURONTIN) 300 MG capsule One tab PO qHS for a week, then BID for a week, then TID. May double weekly to a max of 3,666m/day 180 capsule 3  . promethazine (PHENERGAN) 25 MG tablet Take 1 tablet (25 mg total) by mouth every 8 (eight)  hours as needed for nausea or vomiting. 30 tablet 1   No current facility-administered medications for this visit.    No Known Allergies   Discussed warning signs or symptoms. Please see discharge instructions. Patient expresses understanding.

## 2018-07-26 ENCOUNTER — Ambulatory Visit: Payer: Medicare Other | Admitting: Family Medicine

## 2018-07-31 ENCOUNTER — Encounter: Payer: Self-pay | Admitting: Family Medicine

## 2018-07-31 ENCOUNTER — Ambulatory Visit (INDEPENDENT_AMBULATORY_CARE_PROVIDER_SITE_OTHER): Payer: Medicare Other | Admitting: Family Medicine

## 2018-07-31 ENCOUNTER — Ambulatory Visit (INDEPENDENT_AMBULATORY_CARE_PROVIDER_SITE_OTHER): Payer: Medicare Other

## 2018-07-31 VITALS — BP 132/73 | HR 86 | Temp 97.7°F | Wt 208.0 lb

## 2018-07-31 DIAGNOSIS — R0602 Shortness of breath: Secondary | ICD-10-CM | POA: Diagnosis not present

## 2018-07-31 DIAGNOSIS — J441 Chronic obstructive pulmonary disease with (acute) exacerbation: Secondary | ICD-10-CM

## 2018-07-31 DIAGNOSIS — I252 Old myocardial infarction: Secondary | ICD-10-CM

## 2018-07-31 DIAGNOSIS — R05 Cough: Secondary | ICD-10-CM

## 2018-07-31 DIAGNOSIS — R059 Cough, unspecified: Secondary | ICD-10-CM

## 2018-07-31 DIAGNOSIS — I2510 Atherosclerotic heart disease of native coronary artery without angina pectoris: Secondary | ICD-10-CM

## 2018-07-31 MED ORDER — PREDNISONE 10 MG (48) PO TBPK: ORAL_TABLET | Freq: Every day | ORAL | 0 refills | Status: DC

## 2018-07-31 NOTE — Progress Notes (Signed)
David Woodard is a 67 y.o. male who presents to [place]: [facility] today for cough and shortness of breath.   Kimari was recently seen for COPD exacerbation on 8/27. He was prescribed 68m prednisone for 5 days as well as azithromycin.  He continues to use his albuterol and symbicort. He is not feeling any better. He notes wheezing an SOB. He denies any chest pain or palpitations. No NVD. He notes a mild cough as well.    ROS as above:  Exam:  BP 132/73   Pulse 86   Temp 97.7 F (36.5 C) (Oral)   Wt 208 lb (94.3 kg)   SpO2 98%   BMI 29.84 kg/m  Wt Readings from Last 5 Encounters:  07/31/18 208 lb (94.3 kg)  07/24/18 204 lb (92.5 kg)  05/24/18 200 lb (90.7 kg)  04/30/18 201 lb (91.2 kg)  03/01/18 205 lb (93 kg)    Gen: Well NAD HEENT: EOMI,  MMM, clear nasal discharge Lungs: Normal work of breathing. Course breath sounds and prolonged expiratory phase.  Heart: RRR no MRG Abd: NABS, Soft. Nondistended, Nontender Exts: Brisk capillary refill, warm and well perfused. No edema  Lab and Radiology Results CXR images personally and independently reviewed.  No infiltrate or mass noted. Bronchitic changes present.  Awaiting formal radiology review.   Assessment and Plan: 67y.o. male with COPD exacerbation. Not improving with typical initial management.   Plan to increase the prednisone dose and obtain labs listed below.  Will also obtain CXR as noted above.  Recheck in 2 days. Return sooner if needed.    Orders Placed This Encounter  Procedures  . DG Chest 2 View    Order Specific Question:   Reason for exam:    Answer:   Cough, assess intra-thoracic pathology    Order Specific Question:   Preferred imaging location?    [facility] CBC with Differential/Platelet  . COMPLETE METABOLIC PANEL WITH GFR   Meds ordered this encounter    Medications  . predniSONE (STERAPRED UNI-PAK 48 TAB) 10 MG (48) TBPK tablet    Sig: Take by mouth daily. 12 day dosepack po    Dispense:  48 tablet    Refill:  0     Historical information moved to improve visibility of documentation.  Past Medical History:  Diagnosis Date  . [place] ([facility] 11/[place]2010   Qualifier: Diagnosis of  By: WJoya GaskinsMD, PBurnett Harry  . [place]'s [place]itis ([facility] 09/15[place]2015   Overview:  GI   . [facility]ocardial infarction) ([facility]    Past Surgical History:  Procedure Laterality Date  . CERVICAL FUSION    . CORONARY ANGIOPLASTY WITH STENT PLACEMENT     Social History   Tobacco Use  . Smoking status: Current Every Day Smoker    Packs/day: 1.50    Types: Cigarettes  . Smokeless tobacco: Never Used  Substance Use Topics  . Alcohol use: Yes   family history includes Heart disease in his father; Hypertension in his father.  Medications: Current Outpatient Medications  Medication Sig Dispense Refill  . albuterol (PROVENTIL HFA;VENTOLIN HFA) 108 (90 Base) MCG/ACT inhaler Inhale 2 puffs into the lungs every 6 (six) hours as needed for wheezing or shortness of breath. 1 Inhaler 2  . aspirin 81 MG tablet Take 81 mg by mouth daily.    .Marland Kitchenatenolol (TENORMIN) 25 MG tablet Take 25 mg by  mouth.    . budesonide-formoterol (SYMBICORT) 160-4.5 MCG/ACT inhaler Inhale 2 puffs into the lungs 2 (two) times daily. 1 Inhaler 2  . promethazine (PHENERGAN) 25 MG tablet Take 1 tablet (25 mg total) by mouth every 8 (eight) hours as needed for nausea or vomiting. 30 tablet 1  . rosuvastatin (CRESTOR) 10 MG tablet Take 1 tablet by mouth daily.    . predniSONE (STERAPRED UNI-PAK 48 TAB) 10 MG (48) TBPK tablet Take by mouth daily. 12 day dosepack po 48 tablet 0   No current facility-administered medications for this visit.    No Known Allergies   Discussed warning signs or symptoms. Please see discharge instructions. Patient expresses understanding.

## 2018-07-31 NOTE — Patient Instructions (Signed)
Thank you for coming in today. Take the higher dose of prednisone.  Get labs now.  I will get lab and xray results to you ASAP.

## 2018-08-01 LAB — CBC WITH DIFFERENTIAL/PLATELET
BASOS ABS: 105 {cells}/uL (ref 0–200)
Basophils Relative: 1 %
EOS ABS: 347 {cells}/uL (ref 15–500)
Eosinophils Relative: 3.3 %
HCT: 45 % (ref 38.5–50.0)
Hemoglobin: 15.4 g/dL (ref 13.2–17.1)
LYMPHS ABS: 2394 {cells}/uL (ref 850–3900)
MCH: 31.8 pg (ref 27.0–33.0)
MCHC: 34.2 g/dL (ref 32.0–36.0)
MCV: 92.8 fL (ref 80.0–100.0)
MPV: 9.8 fL (ref 7.5–12.5)
Monocytes Relative: 8.2 %
NEUTROS PCT: 64.7 %
Neutro Abs: 6794 cells/uL (ref 1500–7800)
Platelets: 324 10*3/uL (ref 140–400)
RBC: 4.85 10*6/uL (ref 4.20–5.80)
RDW: 13.1 % (ref 11.0–15.0)
Total Lymphocyte: 22.8 %
WBC mixed population: 861 cells/uL (ref 200–950)
WBC: 10.5 10*3/uL (ref 3.8–10.8)

## 2018-08-01 LAB — COMPLETE METABOLIC PANEL WITH GFR
AG RATIO: 1.6 (calc) (ref 1.0–2.5)
ALKALINE PHOSPHATASE (APISO): 51 U/L (ref 40–115)
ALT: 14 U/L (ref 9–46)
AST: 12 U/L (ref 10–35)
Albumin: 3.8 g/dL (ref 3.6–5.1)
BILIRUBIN TOTAL: 0.5 mg/dL (ref 0.2–1.2)
BUN/Creatinine Ratio: 10 (calc) (ref 6–22)
BUN: 13 mg/dL (ref 7–25)
CHLORIDE: 102 mmol/L (ref 98–110)
CO2: 32 mmol/L (ref 20–32)
Calcium: 9.4 mg/dL (ref 8.6–10.3)
Creat: 1.33 mg/dL — ABNORMAL HIGH (ref 0.70–1.25)
GFR, Est African American: 63 mL/min/{1.73_m2} (ref >=60)
GFR, Est Non African American: 54 mL/min/{1.73_m2} — ABNORMAL LOW (ref >=60)
GLOBULIN: 2.4 g/dL (ref 1.9–3.7)
Glucose, Bld: 93 mg/dL (ref 65–99)
POTASSIUM: 4.3 mmol/L (ref 3.5–5.3)
SODIUM: 138 mmol/L (ref 135–146)
Total Protein: 6.2 g/dL (ref 6.1–8.1)

## 2018-08-02 ENCOUNTER — Ambulatory Visit (INDEPENDENT_AMBULATORY_CARE_PROVIDER_SITE_OTHER): Payer: Medicare Other | Admitting: Family Medicine

## 2018-08-02 ENCOUNTER — Encounter: Payer: Self-pay | Admitting: Family Medicine

## 2018-08-02 VITALS — BP 130/73 | HR 80 | Temp 97.5°F | Wt 206.3 lb

## 2018-08-02 DIAGNOSIS — I2510 Atherosclerotic heart disease of native coronary artery without angina pectoris: Secondary | ICD-10-CM

## 2018-08-02 DIAGNOSIS — I252 Old myocardial infarction: Secondary | ICD-10-CM

## 2018-08-02 DIAGNOSIS — J449 Chronic obstructive pulmonary disease, unspecified: Secondary | ICD-10-CM

## 2018-08-02 MED ORDER — FLUTICASONE-UMECLIDIN-VILANT 100-62.5-25 MCG/INH IN AEPB: 1.0000 | INHALATION_SPRAY | Freq: Every day | RESPIRATORY_TRACT | 12 refills | Status: DC

## 2018-08-02 NOTE — Patient Instructions (Signed)
Thank you for coming in today. Continue the prednisone and inhalers.  Try to get the Trelegy [place].  This will replace Symbicort.   Fluticasone; Umeclidinium; Vilanterol inhalation powder What is this medicine? FLUTICASONE; UMECLIDINIUM; VILANTEROL (floo TIK a sone; ue MEK li DIN ee um; vye LAN ter ol) inhalation is a combination of 3 medicines that decrease inflammation and help to open up the airways of your lungs. It is for chronic obstructive pulmonary disease (COPD), including chronic bronchitis or emphysema. Do NOT use for asthma or an acute asthma attack. Do NOT use for a COPD attack. This medicine may be used for other purposes; ask your health care provider or pharmacist if you have questions. COMMON BRAND NAME(S): TRELEGY ELLIPTA What should I tell my health care provider before I take this medicine? They need to know if you have any of these conditions: -bladder problems or difficulty passing urine -bone problems -diabetes -glaucoma -heart disease or irregular heartbeat -high blood pressure -immune system problems -infection -kidney disease -pheochromocytoma -prostate disease -seizures -thyroid disease -an unusual or allergic reaction to fluticasone, umeclidinium, vilanterol, lactose, milk proteins, other medicines, foods, dyes, or preservatives -pregnant or trying to get pregnant -breast-feeding How should I use this medicine? This medicine is inhaled through the mouth. It is used once per day. Follow the directions on the prescription label. Do not use a spacer device with this inhaler. Take your medicine at regular intervals. Do not take your medicine more often than directed. Do not stop taking except on your doctor's advice. Make sure that you are using your inhaler correctly. Ask you doctor or health care provider if you have any questions. A special MedGuide will be given to you by the pharmacist with each prescription and refill. Be sure to read this information  carefully each time. Talk to your pediatrician regarding the use of this medicine in children. Special care may be needed. Overdosage: If you think you have taken too much of this medicine contact a poison control center or emergency room at once. NOTE: This medicine is only for you. Do not share this medicine with others. What if I miss a dose? If you miss a dose, use it as soon as you can. Do not take more than 1 inhalation per day. If it is almost time for your next dose, use only that dose and continue with your regular schedule. Do not use double or extra doses. What may interact with this medicine? Do not take this medicine with any of the following medications: -cisapride -dofetilide -dronedarone -MAOIs like Carbex, Eldepryl, Marplan, Nardil, and Parnate -pimozide -thioridazine -ziprasidone This medicine may also interact with the following medications: -aclidinium -antihistamines for allergy -antiviral medicines for HIV or AIDS -atropine -beta-blockers like metoprolol and propranolol -certain antibiotics like clarithromycin and telithromycin -certain medicines for bladder problems like oxybutynin, tolterodine -certain medicines for depression, anxiety, or psychotic disturbances -certain medicines for fungal infections like ketoconazole, itraconazole, posaconazole, voriconazole -certain medicines for Parkinson's disease like benztropine, trihexyphenidyl -certain medicines for stomach problems like dicyclomine, hyoscyamine -certain medicines for travel sickness like scopolamine -conivaptan -diuretics -ipratropium -medicines for colds -other medicines for breathing problems -other medicines that prolong the QT interval (cause an abnormal heart rhythm) -nefazodone -tiotropium This list may not describe all possible interactions. Give your health care provider a list of all the medicines, herbs, non-prescription drugs, or dietary supplements you use. Also tell them if you  smoke, drink alcohol, or use illegal drugs. Some items may interact with your medicine. What  should I watch for while using this medicine? Visit your doctor or health care professional for regular checkups. Tell your doctor or health care professional if your symptoms do not get better. Do not use this medicine more than once every 24 hours. NEVER use this medicine for an acute COPD attack. You should use your short-acting rescue inhalers for this purpose. If your symptoms get worse or if you need your short-acting inhalers more often, call your doctor right away. If you are going to have surgery tell your doctor or health care professional that you are using this medicine. Try not to come in contact with people with the chicken pox or measles. If you do, call your doctor. What side effects may I notice from receiving this medicine? Side effects that you should report to your doctor or health care professional as soon as possible: -allergic reactions like skin rash or hives, swelling of the face, lips, or tongue -breathing problems right after inhaling your medicine -changes in vision -chest pain -eye pain -fast, irregular heartbeat -feeling faint or lightheaded, falls -fever or chills -nausea, vomiting -trouble passing urine or change in the amount of urine -unusually weak or tired Side effects that usually do not require medical attention (report these to your doctor or health care professional if they continue or are bothersome): -back pain -changes in taste -cough -diarrhea -headache -nervousness -sore throat -tremor This list may not describe all possible side effects. Call your doctor for medical advice about side effects. You may report side effects to FDA at 1-800-FDA-1088. Where should I keep my medicine? Keep out of the reach of children. Store at room temperature between 15 and 30 degrees C (59 and 86 degrees F). Store in a dry place away from direct heat or sunlight. Throw  away 6 weeks after you remove the inhaler from the foil tray, or after the dose indicator reads 0, whichever comes first. Throw away any unopened packages after the expiration date. NOTE: This sheet is a summary. It may not cover all possible information. If you have questions about this medicine, talk to your doctor, pharmacist, or health care provider.  2018 Elsevier/Gold Standard (2016-11-17 15:52:42)

## 2018-08-02 NOTE — Progress Notes (Signed)
David Woodard is a 67 y.o. male who presents to [place]: [place] today for follow-up COPD exacerbation.  David Woodard was seen 2 days ago for shortness of breath and cough thought to be related to COPD exacerbation.  He had recently been treated for COPD exacerbation prior to his visit 2 days ago with a short burst of prednisone and antibiotics.  At that visit x-rays were obtained which were unremarkable and labs were obtained which did not show any significant change or infection.  He was given a longer course of prednisone and continued with his albuterol and Symbicort inhalers.  He notes he is feeling a bit better today but still quite symptomatic.  He has significant trouble getting the air out of his lungs when he is walking or exerting himself.  He denies chest pain or palpitations.   ROS as above:  Exam:  BP 130/73   Pulse 80   Temp (!) 97.5 F (36.4 C) (Oral)   Wt 206 lb 4.8 oz (93.6 kg)   SpO2 98%   BMI 29.60 kg/m  Wt Readings from Last 5 Encounters:  08/02/18 206 lb 4.8 oz (93.6 kg)  07/31/18 208 lb (94.3 kg)  07/24/18 204 lb (92.5 kg)  05/24/18 200 lb (90.7 kg)  04/30/18 201 lb (91.2 kg)    Gen: Well NAD nontoxic appearing HEENT: EOMI,  MMM Lungs: Normal work of breathing.  Prolonged expiratory phase with wheezing bilaterally. Heart: RRR no MRG Abd: NABS, Soft. Nondistended, Nontender Exts: Brisk capillary refill, warm and well perfused.   Lab and Radiology Results Results for orders placed or performed in visit on 07/31/18 (from the past 72 hour(s))  CBC with Differential/Platelet     Status: None   Collection Time: 07/31/18 12:13 PM  Result Value Ref Range   WBC 10.5 3.8 - 10.8 Thousand/uL   RBC 4.85 4.20 - 5.80 Million/uL   Hemoglobin 15.4 13.2 - 17.1 g/dL   HCT 45.0 38.5 - 50.0 %   MCV 92.8 80.0 - 100.0 fL   MCH 31.8 27.0 - 33.0 pg   MCHC 34.2 32.0  - 36.0 g/dL   RDW 13.1 11.0 - 15.0 %   Platelets 324 140 - 400 Thousand/uL   MPV 9.8 7.5 - 12.5 fL   Neutro Abs 6,794 1,500 - 7,800 cells/uL   Lymphs Abs 2,394 850 - 3,900 cells/uL   WBC mixed population 861 200 - 950 cells/uL   Eosinophils Absolute 347 15 - 500 cells/uL   Basophils Absolute 105 0 - 200 cells/uL   Neutrophils Relative % 64.7 %   Total Lymphocyte 22.8 %   Monocytes Relative 8.2 %   Eosinophils Relative 3.3 %   Basophils Relative 1.0 %  COMPLETE METABOLIC PANEL WITH GFR     Status: Abnormal   Collection Time: 07/31/18 12:13 PM  Result Value Ref Range   Glucose, Bld 93 65 - 99 mg/dL    Comment: .            Fasting reference interval .    BUN 13 7 - 25 mg/dL   Creat 1.33 (H) 0.70 - 1.25 mg/dL    Comment: For patients >71 years of age, the reference limit for Creatinine is approximately 13% higher for people identified as African-American. .    GFR, Est Non African American 54 (L) > OR = 60 mL/min/1.55m   GFR, Est African American 63 > OR = 60 mL/min/1.723m  BUN/Creatinine  Ratio 10 6 - 22 (calc)   Sodium 138 135 - 146 mmol/L   Potassium 4.3 3.5 - 5.3 mmol/L   Chloride 102 98 - 110 mmol/L   CO2 32 20 - 32 mmol/L   Calcium 9.4 8.6 - 10.3 mg/dL   Total Protein 6.2 6.1 - 8.1 g/dL   Albumin 3.8 3.6 - 5.1 g/dL   Globulin 2.4 1.9 - 3.7 g/dL (calc)   AG Ratio 1.6 1.0 - 2.5 (calc)   Total Bilirubin 0.5 0.2 - 1.2 mg/dL   Alkaline phosphatase (APISO) 51 40 - 115 U/L   AST 12 10 - 35 U/L   ALT 14 9 - 46 U/L   Dg Chest 2 View  Result Date: 07/31/2018 CLINICAL DATA:  Cough and congestion.  Shortness of breath. EXAM: CHEST - 2 VIEW COMPARISON:  April 30, 2018 FINDINGS: The heart, hila, mediastinum, and pleura are normal. Mild atelectasis in the bases. No focal infiltrate. IMPRESSION: No active cardiopulmonary disease. Electronically Signed   By: Dorise Bullion III M.D   On: 07/31/2018 16:02  I personally (independently) visualized and performed the interpretation of the  images attached in this note.     Assessment and Plan: 67 y.o. male with COPD exacerbation slight improvement.  Continue prednisone tapering course.  We will try to add triple therapy by switching from Symbicort to Trelegy.  If patient is not able to get Trelegy we will continue Symbicort.  Discussion about medication change and teaching using the Ellipta device performed in clinic today.  Follow-up with me as previously scheduled September 27.  Return sooner if needed.  I spent 15 minutes with this patient, greater than 50% was face-to-face time counseling regarding ddx and plan.    Historical information moved to improve visibility of documentation.  Past Medical History:  Diagnosis Date  . COPD with acute bronchitis ([place]) 11/11/2010   Qualifier: Diagnosis of  By: Joya Gaskins MD, Burnett Harry   . Crohn's ileitis ([place]) 09/15/2015   Overview:  GI   . MI (myocardial infarction) [facility])    Past Surgical History:  Procedure Laterality Date  . CERVICAL FUSION    . CORONARY ANGIOPLASTY WITH STENT PLACEMENT     Social History   Tobacco Use  . Smoking status: Current Every Day Smoker    Packs/day: 1.50    Types: Cigarettes  . Smokeless tobacco: Never Used  Substance Use Topics  . Alcohol use: Yes   family history includes Heart disease in his father; Hypertension in his father.  Medications: Current Outpatient Medications  Medication Sig Dispense Refill  . albuterol (PROVENTIL HFA;VENTOLIN HFA) 108 (90 Base) MCG/ACT inhaler Inhale 2 puffs into the lungs every 6 (six) hours as needed for wheezing or shortness of breath. 1 Inhaler 2  . aspirin 81 MG tablet Take 81 mg by mouth daily.    Marland Kitchen atenolol (TENORMIN) 25 MG tablet Take 25 mg by mouth.    . budesonide-formoterol (SYMBICORT) 160-4.5 MCG/ACT inhaler Inhale 2 puffs into the lungs 2 (two) times daily. 1 Inhaler 2  . predniSONE (STERAPRED UNI-PAK 48 TAB) 10 MG (48) TBPK tablet Take by mouth daily. 12 day dosepack po 48 tablet 0  .  promethazine (PHENERGAN) 25 MG tablet Take 1 tablet (25 mg total) by mouth every 8 (eight) hours as needed for nausea or vomiting. 30 tablet 1  . rosuvastatin (CRESTOR) 10 MG tablet Take 1 tablet by mouth daily.     No current facility-administered medications for this visit.  No Known Allergies   Discussed warning signs or symptoms. Please see discharge instructions. Patient expresses understanding.

## 2018-08-03 DIAGNOSIS — K50019 Crohn's disease of small intestine with unspecified complications: Secondary | ICD-10-CM | POA: Diagnosis not present

## 2018-08-03 DIAGNOSIS — J449 Chronic obstructive pulmonary disease, unspecified: Secondary | ICD-10-CM | POA: Diagnosis not present

## 2018-08-03 DIAGNOSIS — Z955 Presence of coronary angioplasty implant and graft: Secondary | ICD-10-CM | POA: Diagnosis not present

## 2018-08-03 DIAGNOSIS — Z720 Tobacco use: Secondary | ICD-10-CM | POA: Diagnosis not present

## 2018-08-24 ENCOUNTER — Encounter: Payer: Self-pay | Admitting: Family Medicine

## 2018-08-24 ENCOUNTER — Ambulatory Visit (INDEPENDENT_AMBULATORY_CARE_PROVIDER_SITE_OTHER): Payer: Medicare Other | Admitting: Family Medicine

## 2018-08-24 VITALS — BP 119/66 | HR 75 | Ht 70.0 in | Wt 205.0 lb

## 2018-08-24 DIAGNOSIS — I252 Old myocardial infarction: Secondary | ICD-10-CM

## 2018-08-24 DIAGNOSIS — J449 Chronic obstructive pulmonary disease, unspecified: Secondary | ICD-10-CM | POA: Diagnosis not present

## 2018-08-24 DIAGNOSIS — I2510 Atherosclerotic heart disease of native coronary artery without angina pectoris: Secondary | ICD-10-CM

## 2018-08-24 NOTE — Progress Notes (Signed)
David Woodard is a 67 y.o. male who presents to [place]: [place] today for COPD follow-up.  Ermin was seen last month for COPD exacerbation.  He is feeling a lot better now without severe wheezing or shortness of breath.  He continues to use Symbicort.  Trelegy was extremely expensive and not available.  Symbicort is still quite expensive and he is essentially relying on samples.  He does have access to the [place] but he became very frustrated with the [place] previously with a poor experience with a primary care provider.  He is open to retrying the [place] here in [place] if that may allow him to have better access to medication and tests at reduced cost.  No chest pain palpitations or shortness of breath.  Durward is not ready to quit smoking at this time. ROS as above:  Exam:  BP 119/66   Pulse 75   Ht 5' 10"  (1.778 m)   Wt 205 lb (93 kg)   BMI 29.41 kg/m  Wt Readings from Last 5 Encounters:  08/24/18 205 lb (93 kg)  08/02/18 206 lb 4.8 oz (93.6 kg)  07/31/18 208 lb (94.3 kg)  07/24/18 204 lb (92.5 kg)  05/24/18 200 lb (90.7 kg)    Gen: Well NAD HEENT: EOMI,  MMM Lungs: Normal work of breathing.  Wheezing present bilaterally Heart: RRR no MRG Abd: NABS, Soft. Nondistended, Nontender Exts: Brisk capillary refill, warm and well perfused.   Lab and Radiology Results   Chemistry      Component Value Date/Time   NA 138 07/31/2018 1213   NA 135 (A) 05/21/2017   K 4.3 07/31/2018 1213   CL 102 07/31/2018 1213   CO2 32 07/31/2018 1213   BUN 13 07/31/2018 1213   BUN 9 05/21/2017   CREATININE 1.33 (H) 07/31/2018 1213   GLU 105 05/21/2017      Component Value Date/Time   CALCIUM 9.4 07/31/2018 1213   ALKPHOS 61 05/21/2017   AST 12 07/31/2018 1213   ALT 14 07/31/2018 1213   BILITOT 0.5 07/31/2018 1213     Lab Results  Component Value Date   WBC 10.5 07/31/2018   HGB 15.4 07/31/2018   HCT 45.0 07/31/2018   MCV 92.8 07/31/2018   PLT 324 07/31/2018      Assessment and Plan: 67 y.o. male with  COPD.  Chronic not quite ideally controlled but stable.  Patient continues to smoke and is not ready to quit smoking at this time. The biggest barrier to trials is medical management of COPD aside from smoking is his access to medication.  He effectively has 2 options either applying for the manufacturers assistance program which he probably is not eligible for or transitioning some of his care to the [place] to have access to the [place].  The obvious [facility] here is [facility] as I think he will have easier access to medication and testing.  He agrees and will schedule a follow-up appointment with them in the near future.  We will continue to provide care and coordinate care with the [place].  In the meantime continue Symbicort and current treatment for COPD.  Plan to continue to provide samples of able.  Unfortunately I do not have any Symbicort samples now.  Recheck in 3 months if all is well return sooner if needed.  Run out of Symbicort next month and will probably have samples at that point he  will contact the clinic to get some samples.  I spent 25 minutes with this patient, greater than 50% was face-to-face time counseling regarding ddx and plan and [facility] options.     Historical information moved to improve visibility of documentation.  Past Medical History:  Diagnosis Date  . COPD with acute bronchitis ([place]) 11/11/2010   Qualifier: Diagnosis of  By: Joya Gaskins MD, Burnett Harry   . Crohn's ileitis ([place]) 09/15/2015   Overview:  GI   . MI (myocardial infarction) [facility])    Past Surgical History:  Procedure Laterality Date  . CERVICAL FUSION    . CORONARY ANGIOPLASTY WITH STENT PLACEMENT     Social History   Tobacco Use  . Smoking status: Current Every Day Smoker    Packs/day: 1.50    Types: Cigarettes  . Smokeless tobacco: Never Used    Substance Use Topics  . Alcohol use: Yes   family history includes Heart disease in his father; Hypertension in his father.  Medications: Current Outpatient Medications  Medication Sig Dispense Refill  . albuterol (PROVENTIL HFA;VENTOLIN HFA) 108 (90 Base) MCG/ACT inhaler Inhale 2 puffs into the lungs every 6 (six) hours as needed for wheezing or shortness of breath. 1 Inhaler 2  . aspirin 81 MG tablet Take 81 mg by mouth daily.    Marland Kitchen atenolol (TENORMIN) 25 MG tablet Take 25 mg by mouth.    . budesonide-formoterol (SYMBICORT) 160-4.5 MCG/ACT inhaler Inhale 2 puffs into the lungs 2 (two) times daily. 1 Inhaler 2  . Fluticasone-Umeclidin-Vilant (TRELEGY ELLIPTA) 100-62.5-25 MCG/INH AEPB Inhale 1 puff into the lungs daily. 30 each 12  . predniSONE (STERAPRED UNI-PAK 48 TAB) 10 MG (48) TBPK tablet Take by mouth daily. 12 day dosepack po 48 tablet 0  . promethazine (PHENERGAN) 25 MG tablet Take 1 tablet (25 mg total) by mouth every 8 (eight) hours as needed for nausea or vomiting. 30 tablet 1  . rosuvastatin (CRESTOR) 10 MG tablet Take 1 tablet by mouth daily.     No current facility-administered medications for this visit.    No Known Allergies   Discussed warning signs or symptoms. Please see discharge instructions. Patient expresses understanding.

## 2018-08-24 NOTE — Patient Instructions (Addendum)
Thank you for coming in today. Continue current treatment.  Let me know when you are about 2 weeks before you run out of symbicort so I can see if we have samples.  I [facility] [place] [facility]. Your medicine will be much cheaper there.   Recheck in 3 months or sooner if needed.    Chronic Obstructive Pulmonary Disease Chronic obstructive pulmonary disease (COPD) is a long-term (chronic) lung problem. When you have COPD, it is hard for air to get in and out of your lungs. The way your lungs work will never return to normal. Usually the condition gets worse over time. There are things you can do to keep yourself as healthy as possible. Your doctor may treat your condition with:  Medicines.  Quitting smoking, if you smoke.  Rehabilitation. This may involve a team of specialists.  Oxygen.  Exercise and changes to your diet.  Lung surgery.  Comfort measures (palliative care).  Follow these instructions at home: Medicines  Take over-the-counter and prescription medicines only as told by your doctor.  Talk to your doctor before taking any cough or allergy medicines. You may need to avoid medicines that cause your lungs to be dry. Lifestyle  If you smoke, stop. Smoking makes the problem worse. If you need help quitting, ask your doctor.  Avoid being around things that make your breathing worse. This may include smoke, chemicals, and fumes.  Stay active, but remember to also rest.  Learn and use tips on how to relax.  Make sure you get enough sleep. Most adults need at least 7 hours a night.  Eat healthy foods. Eat smaller meals more often. Rest before meals. Controlled breathing  Learn and use tips on how to control your breathing as told by your doctor. Try: ? Breathing in (inhaling) through your nose for 1 second. Then, pucker your lips and breath out (exhale) through your lips for 2 seconds. ? Putting one hand on your belly  (abdomen). Breathe in slowly through your nose for 1 second. Your hand on your belly should move out. Pucker your lips and breathe out slowly through your lips. Your hand on your belly should move in as you breathe out. Controlled coughing  Learn and use controlled coughing to clear mucus from your lungs. The steps are: 1. Lean your head a little forward. 2. Breathe in deeply. 3. Try to hold your breath for 3 seconds. 4. Keep your mouth slightly open while coughing 2 times. 5. Spit any mucus out into a tissue. 6. Rest and do the steps again 1 or 2 times as needed. General instructions  Make sure you get all the shots (vaccines) that your doctor recommends. Ask your doctor about a flu shot and a pneumonia shot.  Use oxygen therapy and therapy to help improve your lungs (pulmonary rehabilitation) if told by your doctor. If you need home oxygen therapy, ask your doctor if you should buy a tool to measure your oxygen level (oximeter).  Make a COPD action plan with your doctor. This helps you know what to do if you feel worse than usual.  Manage any other conditions you have as told by your doctor.  Avoid going outside when it is very hot, cold, or humid.  Avoid people who have a sickness you can catch (contagious).  Keep all follow-up visits as told by your doctor. This is important. Contact a doctor if:  You cough up more mucus than  usual.  There is a change in the color or thickness of the mucus.  It is harder to breathe than usual.  Your breathing is faster than usual.  You have trouble sleeping.  You need to use your medicines more often than usual.  You have trouble doing your normal activities such as getting dressed or walking around the house. Get help right away if:  You have shortness of breath while resting.  You have shortness of breath that stops you from: ? Being able to talk. ? Doing normal activities.  Your chest hurts for longer than 5 minutes.  Your  skin color is more blue than usual.  Your pulse oximeter shows that you have low oxygen for longer than 5 minutes.  You have a fever.  You feel too tired to breathe normally. Summary  Chronic obstructive pulmonary disease (COPD) is a long-term lung problem.  The way your lungs work will never return to normal. Usually the condition gets worse over time. There are things you can do to keep yourself as healthy as possible.  Take over-the-counter and prescription medicines only as told by your doctor.  If you smoke, stop. Smoking makes the problem worse. This information is not intended to replace advice given to you by your health care provider. Make sure you discuss any questions you have with your health care provider. Document Released: 05/02/2008 Document Revised: 04/21/2016 Document Reviewed: 07/11/2013 Elsevier Interactive Patient Education  2017 [employer].

## 2018-08-30 ENCOUNTER — Encounter: Payer: Self-pay | Admitting: Family Medicine

## 2018-08-30 ENCOUNTER — Telehealth: Payer: Self-pay | Admitting: Family Medicine

## 2018-08-30 DIAGNOSIS — D126 Benign neoplasm of colon, unspecified: Secondary | ICD-10-CM | POA: Insufficient documentation

## 2018-08-30 DIAGNOSIS — K5730 Diverticulosis of large intestine without perforation or abscess without bleeding: Secondary | ICD-10-CM

## 2018-08-30 HISTORY — DX: Diverticulosis of large intestine without perforation or abscess without bleeding: K57.30

## 2018-08-30 HISTORY — DX: Benign neoplasm of colon, unspecified: D12.6

## 2018-08-30 NOTE — Telephone Encounter (Signed)
Received colonoscopy report from digestive health specialist dated February 2016.  Colonoscopy shows multiple diverticula and a few sessile polyps that were found to be one hyperplastic and one tubular adenoma. Results will be sent to abstract

## 2018-08-31 ENCOUNTER — Encounter: Payer: Self-pay | Admitting: Family Medicine

## 2018-09-13 ENCOUNTER — Ambulatory Visit (INDEPENDENT_AMBULATORY_CARE_PROVIDER_SITE_OTHER): Payer: Medicare Other | Admitting: Family Medicine

## 2018-09-13 ENCOUNTER — Encounter: Payer: Self-pay | Admitting: Family Medicine

## 2018-09-13 VITALS — BP 134/78 | HR 96 | Temp 97.8°F | Wt 205.0 lb

## 2018-09-13 DIAGNOSIS — J0101 Acute recurrent maxillary sinusitis: Secondary | ICD-10-CM

## 2018-09-13 DIAGNOSIS — I2510 Atherosclerotic heart disease of native coronary artery without angina pectoris: Secondary | ICD-10-CM

## 2018-09-13 DIAGNOSIS — I252 Old myocardial infarction: Secondary | ICD-10-CM

## 2018-09-13 DIAGNOSIS — J449 Chronic obstructive pulmonary disease, unspecified: Secondary | ICD-10-CM

## 2018-09-13 MED ORDER — BUDESONIDE-FORMOTEROL FUMARATE 160-4.5 MCG/ACT IN AERO: 2.0000 | INHALATION_SPRAY | Freq: Two times a day (BID) | RESPIRATORY_TRACT | 2 refills | Status: DC

## 2018-09-13 MED ORDER — PREDNISONE 5 MG (48) PO TBPK: ORAL_TABLET | ORAL | 0 refills | Status: DC

## 2018-09-13 MED ORDER — CEFDINIR 300 MG PO CAPS: 300.0000 mg | ORAL_CAPSULE | Freq: Two times a day (BID) | ORAL | 0 refills | Status: DC

## 2018-09-13 MED ORDER — MUPIROCIN 2 % EX OINT: TOPICAL_OINTMENT | CUTANEOUS | 3 refills | Status: DC

## 2018-09-13 MED ORDER — AZELASTINE HCL 0.1 % NA SOLN: 1.0000 | Freq: Two times a day (BID) | NASAL | 12 refills | Status: DC

## 2018-09-13 NOTE — Progress Notes (Signed)
David Woodard is a 67 y.o. male who presents to [facility] today for cough congestion runny nose sinus pain and pressure wheezing and no soreness.  Symptoms ongoing for about a week now.  Patient notes a mild cough and mild wheezing but not severe wheezing or shortness of breath.  He is tried some over-the-counter medications including Oxymetazoline nasal spray.  He notes his nostrils and skin on his nose is becoming irritated from wiping his nose.  He denies any fevers or chills but notes symptoms are quite obnoxious.  He is worried that he will progress to develop bronchitis or COPD exacerbation.   ROS as above:  Exam:  BP 134/78   Pulse 96   Temp 97.8 F (36.6 C) (Oral)   Wt 205 lb (93 kg)   SpO2 96%   BMI 29.41 kg/m  Wt Readings from Last 5 Encounters:  09/13/18 205 lb (93 kg)  08/24/18 205 lb (93 kg)  08/02/18 206 lb 4.8 oz (93.6 kg)  07/31/18 208 lb (94.3 kg)  07/24/18 204 lb (92.5 kg)    Gen: Well NAD HEENT: EOMI,  MMM mildly erythematous irritated skin on nose and nostrils.  Inflamed nasal turbinates with clear discharge.  Mildly tender palpation bilateral maxillary frontal sinuses.  Posterior pharynx with mild cobblestoning.  Normal tympanic membranes bilaterally.  Minimal cervical lymphadenopathy is present. Lungs: Normal work of breathing.  Slight wheezing present bilaterally. Heart: RRR no MRG Abd: NABS, Soft. Nondistended, Nontender Exts: Brisk capillary refill, warm and well perfused.   Lab and Radiology Results No results found for this or any previous visit (from the past 72 hour(s)). No results found.    Assessment and Plan: 67 y.o. male with sinusitis with early COPD exacerbation.  Plan to treat with prednisone, Omnicef.  Plan to discontinue Oxymetazoline nasal spray.  Will use topical mupirocin for skin irritation as this will help and  treat any potential early impetigo developing.  Recheck if not improving.  Symbicort samples given today as well.   No orders of the defined types were placed in this encounter.  Meds ordered this encounter  Medications  . azelastine (ASTELIN) 0.1 % nasal spray    Sig: Place 1 spray into both nostrils 2 (two) times daily. Use in each nostril as directed    Dispense:  30 mL    Refill:  12  . predniSONE (STERAPRED UNI-PAK 48 TAB) 5 MG (48) TBPK tablet    Sig: 12 day dosepack po    Dispense:  48 tablet    Refill:  0  . cefdinir (OMNICEF) 300 MG capsule    Sig: Take 1 capsule (300 mg total) by mouth 2 (two) times daily.    Dispense:  14 capsule    Refill:  0  . mupirocin ointment (BACTROBAN) 2 %    Sig: Apply to affected area BID for 7 days.    Dispense:  30 g    Refill:  3  . budesonide-formoterol (SYMBICORT) 160-4.5 MCG/ACT inhaler    Sig: Inhale 2 puffs into the lungs 2 (two) times daily.    Dispense:  2 Inhaler    Refill:  2     Historical information moved to improve visibility of documentation.  Past Medical History:  Diagnosis Date  . COPD with acute bronchitis ([place]) 11/11/2010   Qualifier: Diagnosis of  By: Joya Gaskins MD, Burnett Harry   . Crohn's ileitis ([place]) 09/15/2015   Overview:  GI   . Diverticula of colon 08/30/2018   [facility]  . MI (myocardial infarction) ([place])   . Tubular adenoma of colon 08/30/2018   [facility]   Past Surgical History:  Procedure Laterality Date  . CERVICAL FUSION    . CORONARY ANGIOPLASTY WITH STENT PLACEMENT     Social History   Tobacco Use  . Smoking status: Current Every Day Smoker    Packs/day: 1.50    Types: Cigarettes  . Smokeless tobacco: Never Used  Substance Use Topics  . Alcohol use: Yes   family history includes Heart disease in his father; Hypertension in his father.  Medications: Current Outpatient Medications  Medication Sig Dispense  Refill  . albuterol (PROVENTIL HFA;VENTOLIN HFA) 108 (90 Base) MCG/ACT inhaler Inhale 2 puffs into the lungs every 6 (six) hours as needed for wheezing or shortness of breath. 1 Inhaler 2  . aspirin 81 MG tablet Take 81 mg by mouth daily.    Marland Kitchen atenolol (TENORMIN) 25 MG tablet Take 25 mg by mouth.    . budesonide-formoterol (SYMBICORT) 160-4.5 MCG/ACT inhaler Inhale 2 puffs into the lungs 2 (two) times daily. 2 Inhaler 2  . promethazine (PHENERGAN) 25 MG tablet Take 1 tablet (25 mg total) by mouth every 8 (eight) hours as needed for nausea or vomiting. 30 tablet 1  . rosuvastatin (CRESTOR) 10 MG tablet Take 1 tablet by mouth daily.    Marland Kitchen azelastine (ASTELIN) 0.1 % nasal spray Place 1 spray into both nostrils 2 (two) times daily. Use in each nostril as directed 30 mL 12  . cefdinir (OMNICEF) 300 MG capsule Take 1 capsule (300 mg total) by mouth 2 (two) times daily. 14 capsule 0  . mupirocin ointment (BACTROBAN) 2 % Apply to affected area BID for 7 days. 30 g 3  . predniSONE (STERAPRED UNI-PAK 48 TAB) 5 MG (48) TBPK tablet 12 day dosepack po 48 tablet 0   No current facility-administered medications for this visit.    No Known Allergies   Discussed warning signs or symptoms. Please see discharge instructions. Patient expresses understanding.

## 2018-09-13 NOTE — Patient Instructions (Addendum)
Thank you for coming in today. STOP the vicks nasal spray Use astelin nasal spray prescription.  Use nasal saline rinse.  Use the antibiotic ointment as needed.  Recheck if not better.  Take prednisone and omnicef.   Make sure the [facility] requests records.  Return sooner if needed     Sinusitis, Adult Sinusitis is soreness and inflammation of your sinuses. Sinuses are hollow spaces in the bones around your face. They are located:  Around your eyes.  In the middle of your forehead.  Behind your nose.  In your cheekbones.  Your sinuses and nasal passages are lined with a stringy fluid (mucus). Mucus normally drains out of your sinuses. When your nasal tissues get inflamed or swollen, the mucus can get trapped or blocked so air cannot flow through your sinuses. This lets bacteria, viruses, and funguses grow, and that leads to infection. Follow these instructions at home: Medicines  Take, use, or apply over-the-counter and prescription medicines only as told by your doctor. These may include nasal sprays.  If you were prescribed an antibiotic medicine, take it as told by your doctor. Do not stop taking the antibiotic even if you start to feel better. Hydrate and Humidify  Drink enough water to keep your pee (urine) clear or pale yellow.  Use a cool mist humidifier to keep the humidity level in your home above 50%.  Breathe in steam for 10-15 minutes, 3-4 times a day or as told by your doctor. You can do this in the bathroom while a hot shower is running.  Try not to spend time in cool or dry air. Rest  Rest as much as possible.  Sleep with your head raised (elevated).  Make sure to get enough sleep each night. General instructions  Put a warm, moist washcloth on your face 3-4 times a day or as told by your doctor. This will help with discomfort.  Wash your hands often with soap and water. If there is no soap and water, use hand sanitizer.  Do not smoke. Avoid being around  people who are smoking (secondhand smoke).  Keep all follow-up visits as told by your doctor. This is important. Contact a doctor if:  You have a fever.  Your symptoms get worse.  Your symptoms do not get better within 10 days. Get help right away if:  You have a very bad headache.  You cannot stop throwing up (vomiting).  You have pain or swelling around your face or eyes.  You have trouble seeing.  You feel confused.  Your neck is stiff.  You have trouble breathing. This information is not intended to replace advice given to you by your health care provider. Make sure you discuss any questions you have with your health care provider. Document Released: 05/02/2008 Document Revised: 07/10/2016 Document Reviewed: 09/09/2015 Elsevier Interactive Patient Education  [employer].

## 2018-11-15 ENCOUNTER — Ambulatory Visit (INDEPENDENT_AMBULATORY_CARE_PROVIDER_SITE_OTHER): Payer: Medicare Other | Admitting: Family Medicine

## 2018-11-15 ENCOUNTER — Encounter: Payer: Self-pay | Admitting: Family Medicine

## 2018-11-15 VITALS — BP 122/73 | HR 83 | Ht 70.5 in | Wt 205.0 lb

## 2018-11-15 DIAGNOSIS — I252 Old myocardial infarction: Secondary | ICD-10-CM

## 2018-11-15 DIAGNOSIS — J449 Chronic obstructive pulmonary disease, unspecified: Secondary | ICD-10-CM | POA: Diagnosis not present

## 2018-11-15 DIAGNOSIS — I2510 Atherosclerotic heart disease of native coronary artery without angina pectoris: Secondary | ICD-10-CM | POA: Diagnosis not present

## 2018-11-15 NOTE — Progress Notes (Signed)
David Woodard is a 67 y.o. male who presents to [facility] today for follow-up COPD and hypertension.  [place] has established care with a pulmonologist at the [facility] since his last visit.  He has been tested extensively and found to have COPD after what sounds like pulmonary function testing.  He was switched to LABA/LAMA therapy and his Symbicort was discontinued.  He notes he is feeling okay without significant wheezing or shortness of breath or chest pain currently.  Additionally he notes his blood pressure has been better controlled.  No chest pain palpitation or shortness of breath as above.  He continues to take atenolol.  He is in the process of establishing with a new primary care physician at the [facility] as well.  He would like to continue to come here as needed or occasionally for checkups.   ROS as above:  Exam:  BP 122/73   Pulse 83   Ht 5' 10.5" (1.791 m)   Wt 205 lb (93 kg)   BMI 29.00 kg/m  Wt Readings from Last 5 Encounters:  11/15/18 205 lb (93 kg)  09/13/18 205 lb (93 kg)  08/24/18 205 lb (93 kg)  08/02/18 206 lb 4.8 oz (93.6 kg)  07/31/18 208 lb (94.3 kg)    Gen: Well NAD HEENT: EOMI,  MMM Lungs: Normal work of breathing. CTABL Heart: RRR no MRG Abd: NABS, Soft. Nondistended, Nontender Exts: Brisk capillary refill, warm and well perfused.   Lab and Radiology Results No results found for this or any previous visit (from the past 72 hour(s)). No results found.    Assessment and Plan: 67 y.o. male with COPD.  Doing well.  Continue current regimen.  Will obtain records from the [facility] regarding his pulmonary function testing and recent labs.  Recheck as needed.  Otherwise follow-up with me in 6 months.  Hypertension doing well continue current regimen.  I spent 15 minutes with this patient, greater than 50% was face-to-face time counseling regarding  ddx and plan.   No orders of the defined types were placed in this encounter.  No orders of the defined types were placed in this encounter.    Historical information moved to improve visibility of documentation.  Past Medical History:  Diagnosis Date  . COPD with acute bronchitis ([place]) 11/11/2010   Qualifier: Diagnosis of  By: Joya Gaskins MD, Burnett Harry   . Crohn's ileitis ([place]) 09/15/2015   Overview:  GI   . Diverticula of colon 08/30/2018   [facility]  . MI (myocardial infarction) ([place])   . Tubular adenoma of colon 08/30/2018   [facility]   Past Surgical History:  Procedure Laterality Date  . CERVICAL FUSION    . CORONARY ANGIOPLASTY WITH STENT PLACEMENT     Social History   Tobacco Use  . Smoking status: Current Every Day Smoker    Packs/day: 1.50    Types: Cigarettes  . Smokeless tobacco: Never Used  Substance Use Topics  . Alcohol use: Yes   family history includes Heart disease in his father; Hypertension in his father.  Medications: Current Outpatient Medications  Medication Sig Dispense Refill  . albuterol (PROVENTIL HFA;VENTOLIN HFA) 108 (90 Base) MCG/ACT inhaler Inhale into the lungs every 6 (six) hours as needed.    Marland Kitchen aspirin 81 MG tablet Take 81 mg by mouth daily.    Marland Kitchen atenolol (TENORMIN) 25 MG tablet Take 25  mg by mouth.    . promethazine (PHENERGAN) 25 MG tablet Take 1 tablet (25 mg total) by mouth every 8 (eight) hours as needed for nausea or vomiting. 30 tablet 1  . Tiotropium Bromide-Olodaterol 2.5-2.5 MCG/ACT AERS Inhale 2 puffs into the lungs daily.     No current facility-administered medications for this visit.    No Known Allergies   Discussed warning signs or symptoms. Please see discharge instructions. Patient expresses understanding.

## 2018-11-15 NOTE — Patient Instructions (Addendum)
Thank you for coming in today.  Let me know how you are doing.   Let me know the [facility] doctor's name.   Recheck in 6 months or sooner if needed.    Return if you have any problem.

## 2019-02-11 ENCOUNTER — Other Ambulatory Visit: Payer: Self-pay

## 2019-02-11 ENCOUNTER — Encounter: Payer: Self-pay | Admitting: Family Medicine

## 2019-02-11 ENCOUNTER — Ambulatory Visit (INDEPENDENT_AMBULATORY_CARE_PROVIDER_SITE_OTHER): Payer: Medicare Other

## 2019-02-11 ENCOUNTER — Ambulatory Visit (INDEPENDENT_AMBULATORY_CARE_PROVIDER_SITE_OTHER): Payer: Medicare Other | Admitting: Family Medicine

## 2019-02-11 VITALS — BP 164/105 | HR 76 | Temp 97.9°F | Wt 213.0 lb

## 2019-02-11 DIAGNOSIS — R05 Cough: Secondary | ICD-10-CM | POA: Diagnosis not present

## 2019-02-11 DIAGNOSIS — R0602 Shortness of breath: Secondary | ICD-10-CM | POA: Diagnosis not present

## 2019-02-11 DIAGNOSIS — J449 Chronic obstructive pulmonary disease, unspecified: Secondary | ICD-10-CM | POA: Diagnosis not present

## 2019-02-11 DIAGNOSIS — R059 Cough, unspecified: Secondary | ICD-10-CM

## 2019-02-11 LAB — POCT INFLUENZA A/B
INFLUENZA B, POC: NEGATIVE
Influenza A, POC: NEGATIVE

## 2019-02-11 MED ORDER — PREDNISONE 50 MG PO TABS: 50.0000 mg | ORAL_TABLET | Freq: Every day | ORAL | 0 refills | Status: DC

## 2019-02-11 MED ORDER — AZITHROMYCIN 250 MG PO TABS: 250.0000 mg | ORAL_TABLET | Freq: Every day | ORAL | 0 refills | Status: DC

## 2019-02-11 MED ORDER — ALBUTEROL SULFATE (2.5 MG/3ML) 0.083% IN NEBU: 2.5000 mg | INHALATION_SOLUTION | Freq: Four times a day (QID) | RESPIRATORY_TRACT | 1 refills | Status: DC | PRN

## 2019-02-11 NOTE — Patient Instructions (Addendum)
Thank you for coming in today. Get xray now.  Take the prednisone and use the nebulizer as needed.  Use the nebulizer every 6 hours as needed for wheezing or shortness of breath.   Use the cough medicine as needed.   Call or go to the emergency room if you get worse, have trouble breathing, have chest pains, or palpitations.    Chronic Obstructive Pulmonary Disease Chronic obstructive pulmonary disease (COPD) is a long-term (chronic) lung problem. When you have COPD, it is hard for air to get in and out of your lungs. Usually the condition gets worse over time, and your lungs will never return to normal. There are things you can do to keep yourself as healthy as possible.  Your doctor may treat your condition with: ? Medicines. ? Oxygen. ? Lung surgery.  Your doctor may also recommend: ? Rehabilitation. This includes steps to make your body work better. It may involve a team of specialists. ? Quitting smoking, if you smoke. ? Exercise and changes to your diet. ? Comfort measures (palliative care). Follow these instructions at home: Medicines  Take over-the-counter and prescription medicines only as told by your doctor.  Talk to your doctor before taking any cough or allergy medicines. You may need to avoid medicines that cause your lungs to be dry. Lifestyle  If you smoke, stop. Smoking makes the problem worse. If you need help quitting, ask your doctor.  Avoid being around things that make your breathing worse. This may include smoke, chemicals, and fumes.  Stay active, but remember to rest as well.  Learn and use tips on how to relax.  Make sure you get enough sleep. Most adults need at least 7 hours of sleep every night.  Eat healthy foods. Eat smaller meals more often. Rest before meals. Controlled breathing Learn and use tips on how to control your breathing as told by your doctor. Try:  Breathing in (inhaling) through your nose for 1 second. Then, pucker your lips  and breath out (exhale) through your lips for 2 seconds.  Putting one hand on your belly (abdomen). Breathe in slowly through your nose for 1 second. Your hand on your belly should move out. Pucker your lips and breathe out slowly through your lips. Your hand on your belly should move in as you breathe out.  Controlled coughing Learn and use controlled coughing to clear mucus from your lungs. Follow these steps: 1. Lean your head a little forward. 2. Breathe in deeply. 3. Try to hold your breath for 3 seconds. 4. Keep your mouth slightly open while coughing 2 times. 5. Spit any mucus out into a tissue. 6. Rest and do the steps again 1 or 2 times as needed. General instructions  Make sure you get all the shots (vaccines) that your doctor recommends. Ask your doctor about a flu shot and a pneumonia shot.  Use oxygen therapy and pulmonary rehabilitation if told by your doctor. If you need home oxygen therapy, ask your doctor if you should buy a tool to measure your oxygen level (oximeter).  Make a COPD action plan with your doctor. This helps you to know what to do if you feel worse than usual.  Manage any other conditions you have as told by your doctor.  Avoid going outside when it is very hot, cold, or humid.  Avoid people who have a sickness you can catch (contagious).  Keep all follow-up visits as told by your doctor. This is important. Contact a  doctor if:  You cough up more mucus than usual.  There is a change in the color or thickness of the mucus.  It is harder to breathe than usual.  Your breathing is faster than usual.  You have trouble sleeping.  You need to use your medicines more often than usual.  You have trouble doing your normal activities such as getting dressed or walking around the house. Get help right away if:  You have shortness of breath while resting.  You have shortness of breath that stops you from: ? Being able to talk. ? Doing normal  activities.  Your chest hurts for longer than 5 minutes.  Your skin color is more blue than usual.  Your pulse oximeter shows that you have low oxygen for longer than 5 minutes.  You have a fever.  You feel too tired to breathe normally. Summary  Chronic obstructive pulmonary disease (COPD) is a long-term lung problem.  The way your lungs work will never return to normal. Usually the condition gets worse over time. There are things you can do to keep yourself as healthy as possible.  Take over-the-counter and prescription medicines only as told by your doctor.  If you smoke, stop. Smoking makes the problem worse. This information is not intended to replace advice given to you by your health care provider. Make sure you discuss any questions you have with your health care provider. Document Released: 05/02/2008 Document Revised: 12/19/2016 Document Reviewed: 12/19/2016 Elsevier Interactive Patient Education  2019 [employer].

## 2019-02-11 NOTE — Progress Notes (Signed)
David Woodard is a 68 y.o. male who presents to [facility]: [employer] today for cough congestion runny nose sore throat wheezing or shortness of breath.  Symptoms present now for a few days.  His symptoms are consistent with previous episodes of COPD exacerbation.  He denies any significant fevers body aches or headache.  He notes wheezing as well.  He has been using his Stiolto Respimat as well as his rescue albuterol inhaler.  He denies chest pain palpitations.  He notes shortness of breath with exertion that he attributes to his COPD.  The majority of his health care is managed by his [facility] treatment team including pulmonology.  ROS as above:  Exam:  BP (!) 164/105   Pulse 76   Temp 97.9 F (36.6 C) (Oral)   Wt 213 lb (96.6 kg)   BMI 30.13 kg/m  Wt Readings from Last 5 Encounters:  02/11/19 213 lb (96.6 kg)  11/15/18 205 lb (93 kg)  09/13/18 205 lb (93 kg)  08/24/18 205 lb (93 kg)  08/02/18 206 lb 4.8 oz (93.6 kg)    Gen: Well NAD HEENT: EOMI,  MMM posterior pharynx mildly erythematous without exudate.  Mild cervical lymphadenopathy.  Clear nasal discharge with inflamed nasal turbinates bilaterally. Lungs: Increased work of breathing with wheezing present bilaterally with poor air movement and prolonged expiratory phase. Heart: RRR no MRG Abd: NABS, Soft. Nondistended, Nontender Exts: Brisk capillary refill, warm and well perfused.   Lab and Radiology Results Results for orders placed or performed in visit on 02/11/19 (from the past 72 hour(s))  POCT Influenza A/B     Status: Normal   Collection Time: 02/11/19  9:30 AM  Result Value Ref Range   Influenza A, POC Negative Negative   Influenza B, POC Negative Negative   No results found.  2 view chest x-ray images personally independently reviewed Bronchitic changes with no acute infiltrates.  Not significantly  changed from prior x-ray September 2019. Await formal radiology review  Assessment and Plan: 68 y.o. male with COPD exacerbation.  Plan to treat with nebulized albuterol every 6 hours as needed.  Additionally will use prednisone as well as azithromycin for potential secondary bacterial infection.  Will continue current COPD regimen.  Additionally await radiology overread.  PDMP not reviewed this encounter. Orders Placed This Encounter  Procedures  . DG Chest 2 View    Order Specific Question:   Reason for exam:    Answer:   Cough, assess intra-thoracic pathology    Order Specific Question:   Preferred imaging location?    Answer:   [facility]  . POCT Influenza A/B   Meds ordered this encounter  Medications  . albuterol (PROVENTIL) (2.5 MG/3ML) 0.083% nebulizer solution    Sig: Take 3 mLs (2.5 mg total) by nebulization every 6 (six) hours as needed for wheezing or shortness of breath.    Dispense:  150 mL    Refill:  1  . predniSONE (DELTASONE) 50 MG tablet    Sig: Take 1 tablet (50 mg total) by mouth daily.    Dispense:  5 tablet    Refill:  0  . azithromycin (ZITHROMAX) 250 MG tablet    Sig: Take 1 tablet (250 mg total) by mouth daily. Take first 2 tablets together, then 1 every day until finished.    Dispense:  6 tablet    Refill:  0     Historical information moved to improve  visibility of documentation.  Past Medical History:  Diagnosis Date  . COPD with acute bronchitis ([place]) 11/11/2010   Qualifier: Diagnosis of  By: Joya Gaskins MD, Burnett Harry   . Crohn's ileitis ([place]) 09/15/2015   Overview:  GI   . Diverticula of colon 08/30/2018   [facility]  . MI (myocardial infarction) ([place])   . Tubular adenoma of colon 08/30/2018   [facility]   Past Surgical History:  Procedure Laterality Date  . CERVICAL FUSION    . CORONARY ANGIOPLASTY WITH STENT PLACEMENT     Social History   Tobacco  Use  . Smoking status: Current Every Day Smoker    Packs/day: 1.50    Types: Cigarettes  . Smokeless tobacco: Never Used  Substance Use Topics  . Alcohol use: Yes   family history includes Heart disease in his father; Hypertension in his father.  Medications: Current Outpatient Medications  Medication Sig Dispense Refill  . albuterol (PROVENTIL HFA;VENTOLIN HFA) 108 (90 Base) MCG/ACT inhaler Inhale into the lungs every 6 (six) hours as needed.    Marland Kitchen aspirin 81 MG tablet Take 81 mg by mouth daily.    Marland Kitchen atenolol (TENORMIN) 25 MG tablet Take 25 mg by mouth.    . promethazine (PHENERGAN) 25 MG tablet Take 1 tablet (25 mg total) by mouth every 8 (eight) hours as needed for nausea or vomiting. 30 tablet 1  . Tiotropium Bromide-Olodaterol 2.5-2.5 MCG/ACT AERS Inhale 2 puffs into the lungs daily.    Marland Kitchen albuterol (PROVENTIL) (2.5 MG/3ML) 0.083% nebulizer solution Take 3 mLs (2.5 mg total) by nebulization every 6 (six) hours as needed for wheezing or shortness of breath. 150 mL 1  . azithromycin (ZITHROMAX) 250 MG tablet Take 1 tablet (250 mg total) by mouth daily. Take first 2 tablets together, then 1 every day until finished. 6 tablet 0  . predniSONE (DELTASONE) 50 MG tablet Take 1 tablet (50 mg total) by mouth daily. 5 tablet 0   No current facility-administered medications for this visit.    No Known Allergies   Discussed warning signs or symptoms. Please see discharge instructions. Patient expresses understanding.

## 2019-03-27 ENCOUNTER — Telehealth: Payer: Self-pay | Admitting: Family Medicine

## 2019-03-27 NOTE — Telephone Encounter (Signed)
I noticed that you have not had your pneumonia vaccine at least in my clinic.  Have you had that done at the [facility]?  If so do you know when.  If you have not had a pneumonia vaccine I would definitely recommend it.  That is something that the [facility] probably can do or something that I am perfectly capable of doing here in my clinic.  We can schedule you for a nurse visit to get that done.  Please call [phone] to schedule a nurse visit.  Ellard Artis

## 2019-03-28 ENCOUNTER — Encounter: Payer: Self-pay | Admitting: Family Medicine

## 2019-03-28 ENCOUNTER — Ambulatory Visit (INDEPENDENT_AMBULATORY_CARE_PROVIDER_SITE_OTHER): Payer: Medicare Other | Admitting: Family Medicine

## 2019-03-28 VITALS — BP 126/58 | HR 80 | Temp 99.7°F | Wt 213.0 lb

## 2019-03-28 DIAGNOSIS — F17200 Nicotine dependence, unspecified, uncomplicated: Secondary | ICD-10-CM

## 2019-03-28 DIAGNOSIS — J449 Chronic obstructive pulmonary disease, unspecified: Secondary | ICD-10-CM

## 2019-03-28 DIAGNOSIS — J0101 Acute recurrent maxillary sinusitis: Secondary | ICD-10-CM | POA: Diagnosis not present

## 2019-03-28 MED ORDER — CEFDINIR 300 MG PO CAPS: 300.0000 mg | ORAL_CAPSULE | Freq: Two times a day (BID) | ORAL | 0 refills | Status: DC

## 2019-03-28 MED ORDER — FLUTICASONE PROPIONATE 50 MCG/ACT NA SUSP: NASAL | 3 refills | Status: DC

## 2019-03-28 MED ORDER — PREDNISONE 50 MG PO TABS: 50.0000 mg | ORAL_TABLET | Freq: Every day | ORAL | 0 refills | Status: DC

## 2019-03-28 NOTE — Telephone Encounter (Signed)
Called patient and he states he has not had this done at the [facility] to his knowledge. States that he is willing to do it at his appt in June, I will add it to his appt notes.   Pt also notes he has been having a lot of sinus issues and headaches and is wanting a virtual appt, he has been scheduled with Dr Georgina Snell today at 3:40.

## 2019-03-28 NOTE — Progress Notes (Signed)
Virtual Visit  I connected with      David Woodard  by a telemedicine application and verified that I am speaking with the correct person using two identifiers.   I discussed the limitations of evaluation and management by telemedicine and the availability of in person appointments. The patient expressed understanding and agreed to proceed.  History of Present Illness: [name] is a 68 y.o. male who would like to discuss nasal congestion and productive cough. Some wheeze and SOB.  [name] has a history of recurrent nasal congestion and COPD.  He notes bothersome symptoms worsening about a week ago.  He was seen for similar episode about 6 weeks ago and had significant improvement with prednisone and azithromycin.  He like to repeat the course if possible.  He receives the majority of his care at the [place] where he sees a pulmonologist.  He previously had Symbicort for his COPD but it was switched to [place].  He notes this has improved his ability to breathe and reduced his shortness of breath especially with exertion but he is having problems of exacerbation where he has cough and congestion.  He notes his normal schedule follow-up with his [place] pulmonologist was delayed or canceled due to COVID-19 and has not tried to get a new appointment or call back.   Observations/Objective: BP (!) 126/58   Pulse 80   Temp 99.7 F (37.6 C) (Oral)   Wt 213 lb (96.6 kg)   BMI 30.13 kg/m  Wt Readings from Last 5 Encounters:  03/28/19 213 lb (96.6 kg)  02/11/19 213 lb (96.6 kg)  11/15/18 205 lb (93 kg)  09/13/18 205 lb (93 kg)  08/24/18 205 lb (93 kg)   Exam: Normal Speech.  No significant shortness of breath or wheezing.  Lab and Radiology Results No results found for this or any previous visit (from the past 72 hour(s)). No results found.   Assessment and Plan: 68 y.o. male with COPD exacerbation with possible allergic rhinitis or sinusitis as well.  Plan to use short course  of prednisone as well as Omnicef antibiotics. For rhinitis and congestion we will also add Flonase nasal spray and recommend using over-the-counter Zyrtec or cetirizine.  For COPD exacerbation will use short course of prednisone.    David Woodard has had clear benefit with LABA/LAMA therapy for COPD in general however he is having more exacerbations and may benefit from an inhaled steroid component.  I recommend that he contact his [facility] pulmonologist to potentially add this.  He may benefit from Spiriva plus Symbicort or potentially Trelegy or some other similar combination.  PDMP reviewed during this encounter. No orders of the defined types were placed in this encounter.  Meds ordered this encounter  Medications  . predniSONE (DELTASONE) 50 MG tablet    Sig: Take 1 tablet (50 mg total) by mouth daily.    Dispense:  5 tablet    Refill:  0  . cefdinir (OMNICEF) 300 MG capsule    Sig: Take 1 capsule (300 mg total) by mouth 2 (two) times daily.    Dispense:  14 capsule    Refill:  0  . fluticasone (FLONASE) 50 MCG/ACT nasal spray    Sig: One spray in each nostril twice a day, use left hand for right nostril, and right hand for left nostril.    Dispense:  16 g    Refill:  3    Follow Up Instructions:    I discussed the assessment  and treatment plan with the patient. The patient was provided an opportunity to ask questions and all were answered. The patient agreed with the plan and demonstrated an understanding of the instructions.   The patient was advised to call back or seek an in-person evaluation if the symptoms worsen or if the condition fails to improve as anticipated.  Time: 15 minutes of intraservice time, with >22 minutes of total time during today's visit.      Historical information moved to improve visibility of documentation.  Past Medical History:  Diagnosis Date  . COPD with acute bronchitis (David Woodard) 11/11/2010   Qualifier: Diagnosis of  By: Joya Gaskins MD, Burnett Harry   .  Crohn's ileitis (David Woodard) 09/15/2015   Overview:  GI   . Diverticula of colon 08/30/2018   [facility]  . MI (myocardial infarction) ([place])   . Tubular adenoma of colon 08/30/2018   [facility]   Past Surgical History:  Procedure Laterality Date  . CERVICAL FUSION    . CORONARY ANGIOPLASTY WITH STENT PLACEMENT     Social History   Tobacco Use  . Smoking status: Current Every Day Smoker    Packs/day: 1.50    Types: Cigarettes  . Smokeless tobacco: Never Used  Substance Use Topics  . Alcohol use: Yes   family history includes Heart disease in his father; Hypertension in his father.  Medications: Current Outpatient Medications  Medication Sig Dispense Refill  . albuterol (PROVENTIL HFA;VENTOLIN HFA) 108 (90 Base) MCG/ACT inhaler Inhale into the lungs every 6 (six) hours as needed.    Marland Kitchen albuterol (PROVENTIL) (2.5 MG/3ML) 0.083% nebulizer solution Take 3 mLs (2.5 mg total) by nebulization every 6 (six) hours as needed for wheezing or shortness of breath. 150 mL 1  . aspirin 81 MG tablet Take 81 mg by mouth daily.    Marland Kitchen atenolol (TENORMIN) 25 MG tablet Take 25 mg by mouth.    . cefdinir (OMNICEF) 300 MG capsule Take 1 capsule (300 mg total) by mouth 2 (two) times daily. 14 capsule 0  . fluticasone (FLONASE) 50 MCG/ACT nasal spray One spray in each nostril twice a day, use left hand for right nostril, and right hand for left nostril. 16 g 3  . predniSONE (DELTASONE) 50 MG tablet Take 1 tablet (50 mg total) by mouth daily. 5 tablet 0  . promethazine (PHENERGAN) 25 MG tablet Take 1 tablet (25 mg total) by mouth every 8 (eight) hours as needed for nausea or vomiting. 30 tablet 1  . Tiotropium Bromide-Olodaterol 2.5-2.5 MCG/ACT AERS Inhale 2 puffs into the lungs daily.     No current facility-administered medications for this visit.    No Known Allergies

## 2019-03-29 NOTE — Patient Instructions (Signed)
Thank you for coming in today.  Please use over-the-counter Zyrtec (cetirizine) for nasal allergy symptoms. Additionally use Flonase nasal spray. Will take a few days for the Flonase to start working for nasal congestion.  Also I recommend you talk to your pulmonologist at the [facility] about potentially switching to a medication that contains an inhaled steroid.  Trelegy would be an option.  There are other options as well.  Also if you can find out at the [place] if you have had your vaccines like Tdap and pneumonia vaccines that would be helpful.   Chronic Obstructive Pulmonary Disease Exacerbation Chronic obstructive pulmonary disease (COPD) is a long-term (chronic) lung problem. In COPD, the flow of air from the lungs is limited. COPD exacerbations are times that breathing gets worse and you need more than your normal treatment. Without treatment, they can be life threatening. If they happen often, your lungs can become more damaged. If your COPD gets worse, your doctor may treat you with:  Medicines.  Oxygen.  Different ways to clear your airway, such as using a mask. Follow these instructions at home: Medicines  Take over-the-counter and prescription medicines only as told by your doctor.  If you take an antibiotic or steroid medicine, do not stop taking the medicine even if you start to feel better.  Keep up with shots (vaccinations) as told by your doctor. Be sure to get a yearly (annual) flu shot. Lifestyle  Do not smoke. If you need help quitting, ask your doctor.  Eat healthy foods.  Exercise regularly.  Get plenty of sleep.  Avoid tobacco smoke and other things that can bother your lungs.  Wash your hands often with soap and water. This will help keep you from getting an infection. If you cannot use soap and water, use hand sanitizer.  During flu season, avoid areas that are crowded with people. General instructions  Drink enough fluid to keep your pee (urine) clear or  pale yellow. Do not do this if your doctor has told you not to.  Use a cool mist machine (vaporizer).  If you use oxygen or a machine that turns medicine into a mist (nebulizer), continue to use it as told.  Follow all instructions for rehabilitation. These are steps you can take to make your body work better.  Keep all follow-up visits as told by your doctor. This is important. Contact a doctor if:  Your COPD symptoms get worse than normal. Get help right away if:  You are short of breath and it gets worse.  You have trouble talking.  You have chest pain.  You cough up blood.  You have a fever.  You keep throwing up (vomiting).  You feel weak or you pass out (faint).  You feel confused.  You are not able to sleep because of your symptoms.  You are not able to do daily activities. Summary  COPD exacerbations are times that breathing gets worse and you need more treatment than normal.  COPD exacerbations can be very serious and may cause your lungs to become more damaged.  Do not smoke. If you need help quitting, ask your doctor.  Stay up-to-date on your shots. Get a flu shot every year. This information is not intended to replace advice given to you by your health care provider. Make sure you discuss any questions you have with your health care provider. Document Released: 11/03/2011 Document Revised: 12/19/2016 Document Reviewed: 12/19/2016 Elsevier Interactive Patient Education  2019 [employer].  Allergic Rhinitis,  Adult Allergic rhinitis is a reaction to allergens in the air. Allergens are tiny specks (particles) in the air that cause your body to have an allergic reaction. This condition cannot be passed from person to person (is not contagious). Allergic rhinitis cannot be cured, but it can be controlled. There are two types of allergic rhinitis:  Seasonal. This type is also called hay fever. It happens only during certain times of the year.  Perennial.  This type can happen at any time of the year. What are the causes? This condition may be caused by:  Pollen from grasses, trees, and weeds.  House dust mites.  Pet dander.  Mold. What are the signs or symptoms? Symptoms of this condition include:  Sneezing.  Runny or stuffy nose (nasal congestion).  A lot of mucus in the back of the throat (postnasal drip).  Itchy nose.  Tearing of the eyes.  Trouble sleeping.  Being sleepy during day. How is this treated? There is no cure for this condition. You should avoid things that trigger your symptoms (allergens). Treatment can help to relieve symptoms. This may include:  Medicines that block allergy symptoms, such as antihistamines. These may be given as a shot, nasal spray, or pill.  Shots that are given until your body becomes less sensitive to the allergen (desensitization).  Stronger medicines, if all other treatments have not worked. Follow these instructions at home: Avoiding allergens   Find out what you are allergic to. Common allergens include smoke, dust, and pollen.  Avoid them if you can. These are some of the things that you can do to avoid allergens: ? Replace carpet with wood, tile, or vinyl flooring. Carpet can trap dander and dust. ? Clean any mold found in the home. ? Do not smoke. Do not allow smoking in your home. ? Change your heating and air conditioning filter at least once a month. ? During allergy season:  Keep windows closed as much as you can. If possible, use air conditioning when there is a lot of pollen in the air.  Use a special filter for allergies with your furnace and air conditioner.  Plan outdoor activities when pollen counts are lowest. This is usually during the early morning or evening hours.  If you do go outdoors when pollen count is high, wear a special mask for people with allergies.  When you come indoors, take a shower and change your clothes before sitting on furniture or  bedding. General instructions  Do not use fans in your home.  Do not hang clothes outside to dry.  Wear sunglasses to keep pollen out of your eyes.  Wash your hands right away after you touch household pets.  Take over-the-counter and prescription medicines only as told by your doctor.  Keep all follow-up visits as told by your doctor. This is important. Contact a doctor if:  You have a fever.  You have a cough that does not go away (is persistent).  You start to make whistling sounds when you breathe (wheeze).  Your symptoms do not get better with treatment.  You have thick fluid coming from your nose.  You start to have nosebleeds. Get help right away if:  Your tongue or your lips are swollen.  You have trouble breathing.  You feel dizzy or you feel like you are going to pass out (faint).  You have cold sweats. Summary  Allergic rhinitis is a reaction to allergens in the air.  This condition may be caused  by allergens. These include pollen, dust mites, pet dander, and mold.  Symptoms include a runny, itchy nose, sneezing, or tearing eyes. You may also have trouble sleeping or feel sleepy during the day.  Treatment includes taking medicines and avoiding allergens. You may also get shots or take stronger medicines.  Get help if you have a fever or a cough that does not stop. Get help right away if you are short of breath. This information is not intended to replace advice given to you by your health care provider. Make sure you discuss any questions you have with your health care provider. Document Released: 03/16/2011 Document Revised: 06/05/2018 Document Reviewed: 06/05/2018 Elsevier Interactive Patient Education  2019 [employer].

## 2019-04-26 ENCOUNTER — Ambulatory Visit (INDEPENDENT_AMBULATORY_CARE_PROVIDER_SITE_OTHER): Payer: Medicare Other | Admitting: Family Medicine

## 2019-04-26 ENCOUNTER — Encounter: Payer: Self-pay | Admitting: Family Medicine

## 2019-04-26 VITALS — BP 154/96 | HR 87 | Temp 98.1°F | Wt 209.0 lb

## 2019-04-26 DIAGNOSIS — K047 Periapical abscess without sinus: Secondary | ICD-10-CM | POA: Diagnosis not present

## 2019-04-26 MED ORDER — CEFDINIR 300 MG PO CAPS: 300.0000 mg | ORAL_CAPSULE | Freq: Two times a day (BID) | ORAL | 0 refills | Status: DC

## 2019-04-26 NOTE — Progress Notes (Signed)
David Woodard is a 68 y.o. male who presents to [facility] today for dental infection. Starting about 1 week ago left upper jaw pain. Treated with clove oil. Worsened again Monday. Tried to be seen at the [place] and was unable to this week. Symptoms worsened again yesterday and was associated with left facial swelling and pain. No oral medicines yet.  No fevers chills nausea vomiting or diarrhea.  No trouble breathing.     ROS as above:  Exam:  BP (!) 154/96   Pulse 87   Temp 98.1 F (36.7 C) (Oral)   Wt 209 lb (94.8 kg)   BMI 29.56 kg/m  Wt Readings from Last 5 Encounters:  04/26/19 209 lb (94.8 kg)  03/28/19 213 lb (96.6 kg)  02/11/19 213 lb (96.6 kg)  11/15/18 205 lb (93 kg)  09/13/18 205 lb (93 kg)    Gen: Well NAD HEENT: EOMI,  MMM poor dentition throughout with no visible abscess.  Tender palpation left upper molar.  Soft tissue swelling left cheek.  No cervical lymphadenopathy.  No discrete masses or fluctuance or abscess visible or palpated well. Lungs: Normal work of breathing. CTABL Heart: RRR no MRG Abd: NABS, Soft. Nondistended, Nontender Exts: Brisk capillary refill, warm and well perfused.   Lab and Radiology Results No results found for this or any previous visit (from the past 72 hour(s)). No results found.    Assessment and Plan: 68 y.o. male with dental infection.  Discussed options.  Plan to treat with Omnicef.  Over-the-counter medication for pain control.  Recheck as needed.  PDMP not reviewed this encounter. No orders of the defined types were placed in this encounter.  Meds ordered this encounter  Medications  . cefdinir (OMNICEF) 300 MG capsule    Sig: Take 1 capsule (300 mg total) by mouth 2 (two) times daily.    Dispense:  14 capsule    Refill:  0     Historical information moved to improve visibility of documentation.   Past Medical History:  Diagnosis Date  . COPD with acute bronchitis ([place]) 11/11/2010   Qualifier: Diagnosis of  By: Joya Gaskins MD, Burnett Harry   . Crohn's ileitis ([place]) 09/15/2015   Overview:  GI   . Diverticula of colon 08/30/2018   [facility]  . MI (myocardial infarction) ([place])   . Tubular adenoma of colon 08/30/2018   [facility]   Past Surgical History:  Procedure Laterality Date  . CERVICAL FUSION    . CORONARY ANGIOPLASTY WITH STENT PLACEMENT     Social History   Tobacco Use  . Smoking status: Current Every Day Smoker    Packs/day: 1.50    Types: Cigarettes  . Smokeless tobacco: Never Used  Substance Use Topics  . Alcohol use: Yes   family history includes Heart disease in his father; Hypertension in his father.  Medications: Current Outpatient Medications  Medication Sig Dispense Refill  . albuterol (PROVENTIL HFA;VENTOLIN HFA) 108 (90 Base) MCG/ACT inhaler Inhale into the lungs every 6 (six) hours as needed.    Marland Kitchen albuterol (PROVENTIL) (2.5 MG/3ML) 0.083% nebulizer solution Take 3 mLs (2.5 mg total) by nebulization every 6 (six) hours as needed for wheezing or shortness of breath. 150 mL 1  . aspirin 81 MG tablet Take 81 mg by mouth daily.    Marland Kitchen atenolol (TENORMIN) 25 MG tablet Take 25 mg by mouth.    Marland Kitchen  cefdinir (OMNICEF) 300 MG capsule Take 1 capsule (300 mg total) by mouth 2 (two) times daily. 14 capsule 0  . fluticasone (FLONASE) 50 MCG/ACT nasal spray One spray in each nostril twice a day, use left hand for right nostril, and right hand for left nostril. 16 g 3  . predniSONE (DELTASONE) 50 MG tablet Take 1 tablet (50 mg total) by mouth daily. 5 tablet 0  . promethazine (PHENERGAN) 25 MG tablet Take 1 tablet (25 mg total) by mouth every 8 (eight) hours as needed for nausea or vomiting. 30 tablet 1  . Tiotropium Bromide-Olodaterol 2.5-2.5 MCG/ACT AERS Inhale 2 puffs into the lungs daily.     No  current facility-administered medications for this visit.    No Known Allergies   Discussed warning signs or symptoms. Please see discharge instructions. Patient expresses understanding.

## 2019-04-26 NOTE — Patient Instructions (Signed)
Thank you for coming in today. Take the antibiotics.  Ok to continue over the counter pain medicine.  Try to get to a dentist if you can.    Dental Abscess  A dental abscess is a collection of pus in or around a tooth that results from an infection. An abscess can cause pain in the affected area as well as other symptoms. Treatment is important to help with symptoms and to prevent the infection from spreading. What are the causes? This condition is caused by a bacterial infection around the root of the tooth that involves the inner part of the tooth (pulp). It may result from:  Severe tooth decay.  Trauma to the tooth, such as a broken or chipped tooth, that allows bacteria to enter into the pulp.  Severe gum disease around a tooth. What increases the risk? This condition is more likely to develop in males. It is also more likely to develop in people who:  Have dental decay (cavities).  Eat sugary snacks between meals.  Use tobacco products.  Have diabetes.  Have a weakened disease-fighting system (immune system).  Do not brush and care for their teeth regularly. What are the signs or symptoms? Symptoms of this condition include:  Severe pain in and around the infected tooth.  Swelling and redness around the infected tooth, in the mouth, or in the face.  Tenderness.  Pus drainage.  Bad breath.  Bitter taste in the mouth.  Difficulty swallowing.  Difficulty opening the mouth.  Nausea.  Vomiting.  Chills.  Swollen neck glands.  Fever. How is this diagnosed? This condition is diagnosed based on:  Your symptoms and your medical and dental history.  An examination of the infected tooth. During the exam, your dentist may tap on the infected tooth. You may also have X-rays of the affected area. How is this treated? This condition is treated by getting rid of the infection. This may be done with:  Incision and drainage. This procedure is done by making an  incision in the abscess to drain out the pus. Removing pus is the first priority in treating an abscess.  Antibiotic medicines. These may be used in certain situations.  Antibacterial mouth rinse.  A root canal. This may be performed to save the tooth. Your dentist accesses the visible part of your tooth (crown) with a drill and removes any damaged pulp. Then the space is filled and sealed off.  Tooth extraction. The tooth is pulled out if it cannot be saved by other treatment. You may also receive treatment for pain, such as:  Acetaminophen or NSAIDs.  Gels that contain a numbing medicine.  An injection to block the pain near your nerve. Follow these instructions at home: Medicines  Take over-the-counter and prescription medicines only as told by your dentist.  If you were prescribed an antibiotic, take it as told by your dentist. Do not stop taking the antibiotic even if you start to feel better.  If you were prescribed a gel that contains a numbing medicine, use it exactly as told in the directions. Do not use these gels for children who are younger than 53 years of age.  Do not drive or use heavy machinery while taking prescription pain medicine. General instructions  Rinse out your mouth often with salt water to relieve pain or swelling. To make a salt-water mixture, completely dissolve -1 tsp of salt in 1 cup of warm water.  Eat a soft diet while your abscess is healing.  Drink enough fluid to keep your urine pale yellow.  Do not apply heat to the outside of your mouth.  Do not use any products that contain nicotine or tobacco, such as cigarettes and e-cigarettes. If you need help quitting, ask your health care provider.  Keep all follow-up visits as told by your dentist. This is important. How is this prevented?  Brush your teeth every morning and night with fluoride toothpaste. Floss one time each day.  Get regularly scheduled dental cleanings.  Consider having a  dental sealant applied on teeth that have deep holes (caries).  Drink fluoridated water regularly. This includes most tap water. Check the label on bottled water to see if it contains fluoride.  Drink water instead of sugary drinks.  Eat healthy meals and snacks.  Wear a mouth guard or face shield to protect your teeth while playing sports. Contact a health care provider if:  Your pain is worse and is not helped by medicine. Get help right away if:  You have a fever or chills.  Your symptoms suddenly get worse.  You have a very bad headache.  You have problems breathing or swallowing.  You have trouble opening your mouth.  You have swelling in your neck or around your eye. Summary  A dental abscess is a collection of pus in or around a tooth that results from an infection.  A dental abscess may result from severe tooth decay, trauma to the tooth, or severe gum disease around a tooth.  Symptoms include severe pain, swelling, redness, and drainage of pus in and around the infected tooth.  The first priority in treating a dental abscess is to drain out the pus. Treatment may also involve removing damage inside the tooth (root canal) or pulling out (extracting) the tooth. This information is not intended to replace advice given to you by your health care provider. Make sure you discuss any questions you have with your health care provider. Document Released: 11/14/2005 Document Revised: 07/17/2017 Document Reviewed: 07/17/2017 Elsevier Interactive Patient Education  2019 [employer].

## 2019-05-16 ENCOUNTER — Ambulatory Visit (INDEPENDENT_AMBULATORY_CARE_PROVIDER_SITE_OTHER): Payer: Medicare Other | Admitting: Family Medicine

## 2019-05-16 ENCOUNTER — Encounter: Payer: Self-pay | Admitting: Family Medicine

## 2019-05-16 VITALS — BP 107/58 | HR 77 | Temp 98.2°F | Wt 208.0 lb

## 2019-05-16 DIAGNOSIS — I2510 Atherosclerotic heart disease of native coronary artery without angina pectoris: Secondary | ICD-10-CM

## 2019-05-16 DIAGNOSIS — J449 Chronic obstructive pulmonary disease, unspecified: Secondary | ICD-10-CM | POA: Diagnosis not present

## 2019-05-16 DIAGNOSIS — R002 Palpitations: Secondary | ICD-10-CM | POA: Diagnosis not present

## 2019-05-16 DIAGNOSIS — K50018 Crohn's disease of small intestine with other complication: Secondary | ICD-10-CM | POA: Diagnosis not present

## 2019-05-16 DIAGNOSIS — E785 Hyperlipidemia, unspecified: Secondary | ICD-10-CM | POA: Diagnosis not present

## 2019-05-16 NOTE — Progress Notes (Signed)
David Woodard is a 68 y.o. male who presents to [facility]: [facility] today for hypertension follow-up.  Patient receives the majority of his care at the [facility] who essentially [facility] his PCP.  However with COVID-19 almost all of his [facility] appointments have been pushed back.  He notes that he takes atenolol 25 mg.  Over the last few weeks he is feeling lightheaded and dizzy.  He notes sometimes when he stands up he gets more symptomatic.  He denies any syncope.  He has occasionally has a sensation that his heart skips a beat.  He denies chest pain or severe shortness of breath.  Notes he is been under a little bit more stress recently with a girlfriend who is dealing with a bad back.  ROS as above:  Exam:  BP (!) 107/58   Pulse 77   Temp 98.2 F (36.8 C) (Oral)   Wt 208 lb (94.3 kg)   BMI 29.42 kg/m  Wt Readings from Last 5 Encounters:  05/16/19 208 lb (94.3 kg)  04/26/19 209 lb (94.8 kg)  03/28/19 213 lb (96.6 kg)  02/11/19 213 lb (96.6 kg)  11/15/18 205 lb (93 kg)    Gen: Well NAD HEENT: EOMI,  MMM Lungs: Normal work of breathing. CTABL Heart: RRR no MRG Abd: NABS, Soft. Nondistended, Nontender Exts: Brisk capillary refill, warm and well perfused.   Lab and Radiology Results   Twelve-lead EKG: Normal sinus rhythm at 72 bpm.  No ST segment elevation or depression.  Normal EKG.   Assessment and Plan: 68 y.o. male with lightheadedness and fatigue and dizziness.  Patient does have a coronary artery disease history therefore limited heart work-up in clinic was started pain.  EKG was reassuringly normal.  I think his symptoms are likely because of hypotension.  Reduce atenolol by half and recheck in 2 to 4 weeks.  Warning signs and symptoms reviewed.  Recheck sooner if needed. Also check CBC given history of Crohn's disease.  Anemia could certainly be a possibility as well.   PDMP not reviewed this encounter. Orders Placed This Encounter  Procedures  . CBC  . COMPLETE METABOLIC PANEL WITH GFR  . TSH  . LDL cholesterol, direct   No orders of the defined types were placed in this encounter.    Historical information moved to improve visibility of documentation.  Past Medical History:  Diagnosis Date  . COPD with acute bronchitis ([place]) 11/11/2010   Qualifier: Diagnosis of  By: Joya Gaskins MD, Burnett Harry   . Crohn's ileitis ([place]) 09/15/2015   Overview:  GI   . Diverticula of colon 08/30/2018   [facility]  . MI (myocardial infarction) ([place])   . Tubular adenoma of colon 08/30/2018   [facility]   Past Surgical History:  Procedure Laterality Date  . CERVICAL FUSION    . CORONARY ANGIOPLASTY WITH STENT PLACEMENT     Social History   Tobacco Use  . Smoking status: Current Every Day Smoker    Packs/day: 1.50    Types: Cigarettes  . Smokeless tobacco: Never Used  Substance Use Topics  . Alcohol use: Yes   family history includes Heart disease in his father; Hypertension in his father.  Medications: Current Outpatient Medications  Medication Sig Dispense Refill  . albuterol (PROVENTIL HFA;VENTOLIN HFA) 108 (90 Base) MCG/ACT inhaler Inhale into the lungs every 6 (six) hours as needed.    Marland Kitchen  albuterol (PROVENTIL) (2.5 MG/3ML) 0.083% nebulizer solution Take 3 mLs (2.5 mg total) by nebulization every 6 (six) hours as needed for wheezing or shortness of breath. 150 mL 1  . aspirin 81 MG tablet Take 81 mg by mouth daily.    Marland Kitchen atenolol (TENORMIN) 25 MG tablet Take 25 mg by mouth.    . fluticasone (FLONASE) 50 MCG/ACT nasal spray One spray in each nostril twice a day, use left hand for right nostril, and right hand for left nostril. 16 g 3  . promethazine (PHENERGAN) 25 MG tablet Take 1 tablet (25 mg total) by mouth every 8 (eight) hours as needed for nausea or vomiting. 30 tablet 1  .  Tiotropium Bromide-Olodaterol 2.5-2.5 MCG/ACT AERS Inhale 2 puffs into the lungs daily.     No current facility-administered medications for this visit.    No Known Allergies   Discussed warning signs or symptoms. Please see discharge instructions. Patient expresses understanding.

## 2019-05-16 NOTE — Patient Instructions (Signed)
Thank you for coming in today. Take 1/2 pill of the atenolol blood pressure medicine.  Get labs now.  Recheck in 2-4 weeks or sooner if needed.  If worsening let me know and I will start the heart work up sooner.  If a lot worse go to the ED.   Palpitations Palpitations are feelings that your heartbeat is irregular or is faster than normal. It may feel like your heart is fluttering or skipping a beat. Palpitations are usually not a serious problem. They may be caused by many things, including smoking, caffeine, alcohol, stress, and certain medicines or drugs. Most causes of palpitations are not serious. However, some palpitations can be a sign of a serious problem. You may need further tests to rule out serious medical problems. Follow these instructions at home:     Pay attention to any changes in your condition. Take these actions to help manage your symptoms: Eating and drinking  Avoid foods and drinks that may cause palpitations. These may include: ? Caffeinated coffee, tea, soft drinks, diet pills, and energy drinks. ? Chocolate. ? Alcohol. Lifestyle  Take steps to reduce your stress and anxiety. Things that can help you relax include: ? Yoga. ? Mind-body activities, such as deep breathing, meditation, or using words and images to create positive thoughts (guided imagery). ? Physical activity, such as swimming, jogging, or walking. Tell your health care provider if your palpitations increase with activity. If you have chest pain or shortness of breath with activity, do not continue the activity until you are seen by your health care provider. ? Biofeedback. This is a method that helps you learn to use your mind to control things in your body, such as your heartbeat.  Do not use drugs, including cocaine or ecstasy. Do not use marijuana.  Get plenty of rest and sleep. Keep a regular bed time. General instructions  Take over-the-counter and prescription medicines only as told by  your health care provider.  Do not use any products that contain nicotine or tobacco, such as cigarettes and e-cigarettes. If you need help quitting, ask your health care provider.  Keep all follow-up visits as told by your health care provider. This is important. These may include visits for further testing if palpitations do not go away or get worse. Contact a health care provider if you:  Continue to have a fast or irregular heartbeat after 24 hours.  Notice that your palpitations occur more often. Get help right away if you:  Have chest pain or shortness of breath.  Have a severe headache.  Feel dizzy or you faint. Summary  Palpitations are feelings that your heartbeat is irregular or is faster than normal. It may feel like your heart is fluttering or skipping a beat.  Palpitations may be caused by many things, including smoking, caffeine, alcohol, stress, certain medicines, and drugs.  Although most causes of palpitations are not serious, some causes can be a sign of a serious medical problem.  Get help right away if you faint or have chest pain, shortness of breath, a severe headache, or dizziness. This information is not intended to replace advice given to you by your health care provider. Make sure you discuss any questions you have with your health care provider. Document Released: 11/11/2000 Document Revised: 12/27/2017 Document Reviewed: 12/27/2017 Elsevier Interactive Patient Education  2019 Elsevier Inc.4   Syncope Syncope is when you pass out (faint) for a short time. It is caused by a sudden decrease in blood flow  to the brain. Signs that you may be about to pass out include:  Feeling dizzy or light-headed.  Feeling sick to your stomach (nauseous).  Seeing all white or all black.  Having cold, clammy skin. If you pass out, get help right away. Call your local emergency services (911 in the [place] Do not drive yourself to the hospital. Follow these  instructions at home: Watch for any changes in your symptoms. Take these actions to stay safe and help with your symptoms: Lifestyle  Do not drive, use machinery, or play sports until your doctor says it is okay.  Do not drink alcohol.  Do not use any products that contain nicotine or tobacco, such as cigarettes and e-cigarettes. If you need help quitting, ask your doctor.  Drink enough fluid to keep your pee (urine) pale yellow. General instructions  Take over-the-counter and prescription medicines only as told by your doctor.  If you are taking blood pressure or heart medicine, sit up and stand up slowly. Spend a few minutes getting ready to sit and then stand. This can help you feel less dizzy.  Have someone stay with you until you feel stable.  If you start to feel like you might pass out, lie down right away and raise (elevate) your feet above the level of your heart. Breathe deeply and steadily. Wait until all of the symptoms are gone.  Keep all follow-up visits as told by your doctor. This is important. Get help right away if:  You have a very bad headache.  You pass out once or more than once.  You have pain in your chest, belly, or back.  You have a very fast or uneven heartbeat (palpitations).  It hurts to breathe.  You are bleeding from your mouth or your bottom (rectum).  You have black or tarry poop (stool).  You have jerky movements that you cannot control (seizure).  You are confused.  You have trouble walking.  You are very weak.  You have vision problems. These symptoms may be an emergency. Do not wait to see if the symptoms will go away. Get medical help right away. Call your local emergency services (911 in the [place] Do not drive yourself to the hospital. Summary  Syncope is when you pass out (faint) for a short time. It is caused by a sudden decrease in blood flow to the brain.  Signs that you may be about to faint include feeling dizzy,  light-headed, or sick to your stomach, seeing all white or all black, or having cold, clammy skin.  If you start to feel like you might pass out, lie down right away and raise (elevate) your feet above the level of your heart. Breathe deeply and steadily. Wait until all of the symptoms are gone. This information is not intended to replace advice given to you by your health care provider. Make sure you discuss any questions you have with your health care provider. Document Released: 05/02/2008 Document Revised: 12/27/2017 Document Reviewed: 12/27/2017 Elsevier Interactive Patient Education  2019 [employer].

## 2019-05-17 LAB — CBC
HCT: 48.8 % (ref 38.5–50.0)
Hemoglobin: 16.5 g/dL (ref 13.2–17.1)
MCH: 32.4 pg (ref 27.0–33.0)
MCHC: 33.8 g/dL (ref 32.0–36.0)
MCV: 95.9 fL (ref 80.0–100.0)
MPV: 9.7 fL (ref 7.5–12.5)
Platelets: 351 10*3/uL (ref 140–400)
RBC: 5.09 10*6/uL (ref 4.20–5.80)
RDW: 14 % (ref 11.0–15.0)
WBC: 11 10*3/uL — ABNORMAL HIGH (ref 3.8–10.8)

## 2019-05-17 LAB — COMPLETE METABOLIC PANEL WITH GFR
AG Ratio: 1.6 (calc) (ref 1.0–2.5)
ALT: 11 U/L (ref 9–46)
AST: 12 U/L (ref 10–35)
Albumin: 3.9 g/dL (ref 3.6–5.1)
Alkaline phosphatase (APISO): 55 U/L (ref 35–144)
BUN/Creatinine Ratio: 11 (calc) (ref 6–22)
BUN: 14 mg/dL (ref 7–25)
CO2: 26 mmol/L (ref 20–32)
Calcium: 10 mg/dL (ref 8.6–10.3)
Chloride: 103 mmol/L (ref 98–110)
Creat: 1.31 mg/dL — ABNORMAL HIGH (ref 0.70–1.25)
GFR, Est African American: 64 mL/min/{1.73_m2} (ref >=60)
GFR, Est Non African American: 55 mL/min/{1.73_m2} — ABNORMAL LOW (ref >=60)
Globulin: 2.5 g/dL (calc) (ref 1.9–3.7)
Glucose, Bld: 90 mg/dL (ref 65–99)
Potassium: 4.8 mmol/L (ref 3.5–5.3)
Sodium: 136 mmol/L (ref 135–146)
Total Bilirubin: 0.5 mg/dL (ref 0.2–1.2)
Total Protein: 6.4 g/dL (ref 6.1–8.1)

## 2019-05-17 LAB — TSH: TSH: 2.62 mIU/L (ref 0.40–4.50)

## 2019-05-17 LAB — LDL CHOLESTEROL, DIRECT: Direct LDL: 116 mg/dL — ABNORMAL HIGH (ref <100)

## 2019-06-04 DIAGNOSIS — I2510 Atherosclerotic heart disease of native coronary artery without angina pectoris: Secondary | ICD-10-CM | POA: Diagnosis not present

## 2019-06-04 DIAGNOSIS — I252 Old myocardial infarction: Secondary | ICD-10-CM | POA: Diagnosis not present

## 2019-06-04 DIAGNOSIS — J449 Chronic obstructive pulmonary disease, unspecified: Secondary | ICD-10-CM | POA: Diagnosis not present

## 2019-06-04 DIAGNOSIS — Z955 Presence of coronary angioplasty implant and graft: Secondary | ICD-10-CM | POA: Diagnosis not present

## 2019-06-04 DIAGNOSIS — E785 Hyperlipidemia, unspecified: Secondary | ICD-10-CM | POA: Diagnosis not present

## 2019-06-06 ENCOUNTER — Ambulatory Visit (INDEPENDENT_AMBULATORY_CARE_PROVIDER_SITE_OTHER): Payer: Medicare Other | Admitting: Family Medicine

## 2019-06-06 ENCOUNTER — Encounter: Payer: Self-pay | Admitting: Family Medicine

## 2019-06-06 ENCOUNTER — Other Ambulatory Visit: Payer: Self-pay

## 2019-06-06 ENCOUNTER — Ambulatory Visit: Payer: Medicare Other | Admitting: Family Medicine

## 2019-06-06 VITALS — BP 134/78 | HR 83 | Temp 97.5°F | Ht 70.0 in | Wt 209.0 lb

## 2019-06-06 DIAGNOSIS — F17200 Nicotine dependence, unspecified, uncomplicated: Secondary | ICD-10-CM

## 2019-06-06 DIAGNOSIS — E785 Hyperlipidemia, unspecified: Secondary | ICD-10-CM

## 2019-06-06 DIAGNOSIS — R002 Palpitations: Secondary | ICD-10-CM

## 2019-06-06 MED ORDER — PROMETHAZINE HCL 25 MG PO TABS: 25.0000 mg | ORAL_TABLET | Freq: Three times a day (TID) | ORAL | 3 refills | Status: DC | PRN

## 2019-06-06 NOTE — Patient Instructions (Signed)
Thank you for coming in today. Continue 1/2 pill atenolol.  Recheck in 3 months or sooner if needed.  We will get labs in something 6 weeks.   Work on cutting back on smoking.

## 2019-06-06 NOTE — Progress Notes (Signed)
David Woodard is a 68 y.o. male who presents to [facility]: [facility] today for follow-up near syncope.  Patient was seen on June 18 after having near syncope and some palpitations.  He had orthostatic vital signs and his atenolol dose was reduced to 12.5 mg daily.  Additionally he had follow-up with his cardiologist.  He has a pending stress test and is feeling a lot better now.  He denies chest pain palpitation shortness of breath lightheadedness or dizziness.  Additionally his cardiologist restarted Crestor 20 mg daily.  He is tolerated that medication well in the past.  Additionally Juanda Crumble notes that he is reducing his tobacco smoking.  He feels like his breathing is a little bit better.  He is finding it hard to quit completely.  ROS as above:  Exam:  BP 134/78   Pulse 83   Temp (!) 97.5 F (36.4 C) (Oral)   Ht 5' 10"  (1.778 m)   Wt 209 lb (94.8 kg)   SpO2 99%   BMI 29.99 kg/m  Wt Readings from Last 5 Encounters:  06/06/19 209 lb (94.8 kg)  05/16/19 208 lb (94.3 kg)  04/26/19 209 lb (94.8 kg)  03/28/19 213 lb (96.6 kg)  02/11/19 213 lb (96.6 kg)    Gen: Well NAD HEENT: EOMI,  MMM Lungs: Normal work of breathing. CTABL Heart: RRR no MRG Abd: NABS, Soft. Nondistended, Nontender Exts: Brisk capillary refill, warm and well perfused.   Lab and Radiology Results No results found for this or any previous visit (from the past 72 hour(s)). No results found.    Assessment and Plan: 68 y.o. male with near syncope.  Much improved.  Plan to continue lower dose of atenolol.  Recheck in about 3 months.  Will recheck lipid panel and metabolic panel in about 6 weeks after starting Crestor.  Continue to encourage smoking cessation or reduction.  Recheck in person in about 3 months.   Historical information moved to improve visibility of documentation.  Past Medical  History:  Diagnosis Date  . COPD with acute bronchitis ([place]) 11/11/2010   Qualifier: Diagnosis of  By: Joya Gaskins MD, Burnett Harry   . Crohn's ileitis ([place]) 09/15/2015   Overview:  GI   . Diverticula of colon 08/30/2018   [facility]  . MI (myocardial infarction) ([place])   . Tubular adenoma of colon 08/30/2018   [facility]   Past Surgical History:  Procedure Laterality Date  . CERVICAL FUSION    . CORONARY ANGIOPLASTY WITH STENT PLACEMENT     Social History   Tobacco Use  . Smoking status: Current Every Day Smoker    Packs/day: 1.50    Types: Cigarettes  . Smokeless tobacco: Never Used  Substance Use Topics  . Alcohol use: Yes   family history includes Heart disease in his father; Hypertension in his father.  Medications: Current Outpatient Medications  Medication Sig Dispense Refill  . albuterol (PROVENTIL HFA;VENTOLIN HFA) 108 (90 Base) MCG/ACT inhaler Inhale into the lungs every 6 (six) hours as needed.    Marland Kitchen albuterol (PROVENTIL) (2.5 MG/3ML) 0.083% nebulizer solution Take 3 mLs (2.5 mg total) by nebulization every 6 (six) hours as needed for wheezing or shortness of breath. 150 mL 1  . aspirin 81 MG tablet Take 81 mg by mouth daily.    Marland Kitchen atenolol (TENORMIN) 25 MG tablet Take 12.5 mg by mouth.    Marland Kitchen  fluticasone (FLONASE) 50 MCG/ACT nasal spray One spray in each nostril twice a day, use left hand for right nostril, and right hand for left nostril. 16 g 3  . promethazine (PHENERGAN) 25 MG tablet Take 1 tablet (25 mg total) by mouth every 8 (eight) hours as needed for nausea or vomiting. 30 tablet 3  . rosuvastatin (CRESTOR) 20 MG tablet Take 20 mg by mouth daily.    . Tiotropium Bromide-Olodaterol 2.5-2.5 MCG/ACT AERS Inhale 2 puffs into the lungs daily.     No current facility-administered medications for this visit.    No Known Allergies   Discussed warning signs or symptoms. Please see discharge  instructions. Patient expresses understanding.

## 2019-06-07 NOTE — Addendum Note (Signed)
Addended by: Clemetine Marker A on: 06/07/2019 08:46 AM   Modules accepted: Orders

## 2019-07-23 ENCOUNTER — Telehealth: Payer: Self-pay | Admitting: Family Medicine

## 2019-07-23 DIAGNOSIS — E785 Hyperlipidemia, unspecified: Secondary | ICD-10-CM

## 2019-07-23 DIAGNOSIS — Z5181 Encounter for therapeutic drug level monitoring: Secondary | ICD-10-CM

## 2019-07-23 NOTE — Telephone Encounter (Signed)
-----   Message from Gregor Hams, MD sent at 06/06/2019  2:01 PM EDT ----- Regarding: check chlesterol and liver 6 weeks after starting crestor Check CMP and lipid 6 weeks after crestor starting 06/06/19

## 2019-07-23 NOTE — Telephone Encounter (Signed)
Plan to check cholesterol and liver function 6 weeks after starting Crestor.  Get labs done in near future.  Lab does not need to be fasting.

## 2019-07-23 NOTE — Telephone Encounter (Signed)
Left message on patient vm with advise as noted below. Rhonda Cunningham,CMA

## 2019-09-09 ENCOUNTER — Encounter: Payer: Self-pay | Admitting: Family Medicine

## 2019-09-09 ENCOUNTER — Other Ambulatory Visit: Payer: Self-pay

## 2019-09-09 ENCOUNTER — Ambulatory Visit (INDEPENDENT_AMBULATORY_CARE_PROVIDER_SITE_OTHER): Payer: Medicare Other | Admitting: Family Medicine

## 2019-09-09 VITALS — BP 117/60 | HR 78 | Temp 98.1°F | Wt 208.0 lb

## 2019-09-09 DIAGNOSIS — R42 Dizziness and giddiness: Secondary | ICD-10-CM

## 2019-09-09 DIAGNOSIS — E785 Hyperlipidemia, unspecified: Secondary | ICD-10-CM

## 2019-09-09 DIAGNOSIS — I252 Old myocardial infarction: Secondary | ICD-10-CM

## 2019-09-09 DIAGNOSIS — J449 Chronic obstructive pulmonary disease, unspecified: Secondary | ICD-10-CM

## 2019-09-09 DIAGNOSIS — I2510 Atherosclerotic heart disease of native coronary artery without angina pectoris: Secondary | ICD-10-CM

## 2019-09-09 DIAGNOSIS — K5090 Crohn's disease, unspecified, without complications: Secondary | ICD-10-CM

## 2019-09-09 MED ORDER — AZITHROMYCIN 250 MG PO TABS: 250.0000 mg | ORAL_TABLET | Freq: Every day | ORAL | 0 refills | Status: DC

## 2019-09-09 MED ORDER — PREDNISONE 50 MG PO TABS: 50.0000 mg | ORAL_TABLET | Freq: Every day | ORAL | 0 refills | Status: DC

## 2019-09-09 NOTE — Progress Notes (Signed)
David Woodard is a 68 y.o. male who presents to [facility]: [facility] today for cough and wheezing.    David Woodard has a history of COPD managed with pulmonology at the [facility].  He takes Albuterol as well as LABA/LAMA inhailler Tiotrprium/Oldaterol.  He notes he has been wheezing and coughing a bit more recently and having to use his albuterol inhaler more.  He has a history of bronchitis episodes occurring this time of year.  He notes cough is productive.  No fevers or chills.  Additionally he is having some lightheadedness.  He has a history of heart disease and lightheadedness in the past.  He had cardiology work-up recently.  His atenolol dose was decreased to 12.5.  He does not check his blood pressure at home.  He denies chest pain or palpitations.  Hyperlipidemia Crestor 20 mg daily started by cardiology in July with plan to get LDL less than 20.  Last LDL was 18th at 116.  He also notes he has been having a little bit of abdominal pain.  He has a history of Crohn's disease and has been having some abdominal pain and cramping and diarrhea over the last 2 weeks.  He denies any fevers or chills.  He is tried taking some Aleve which helps.  He notes his symptoms are somewhat consistent with history of Crohn's disease.  This is been a while since he has seen his gastroenterologist.  ROS as above:  Exam:  BP 117/60   Pulse 78   Temp 98.1 F (36.7 C) (Oral)   Wt 208 lb (94.3 kg)   SpO2 98%   BMI 29.84 kg/m  Wt Readings from Last 5 Encounters:  09/09/19 208 lb (94.3 kg)  06/06/19 209 lb (94.8 kg)  05/16/19 208 lb (94.3 kg)  04/26/19 209 lb (94.8 kg)  03/28/19 213 lb (96.6 kg)    Gen: Well NAD nontoxic appearing HEENT: EOMI,  MMM Lungs: Normal work of breathing.  Wheezing and prolonged expiratory phase present bilaterally. Heart: RRR no MRG Abd: NABS, Soft. Nondistended, no  masses palpated.  Minimally tender to palpation with no rebound or guarding. Exts: Brisk capillary refill, warm and well perfused.     Assessment and Plan: 68 y.o. male with  Lightheadedness: Blood pressure slightly low.  Suspect his blood pressures probably lower at home and having some orthostatic symptoms.  Plan to discontinue atenolol and follow-up with cardiology.  Check basic metabolic panel and labs listed below.  Recheck if not improving.  Cough: Likely exacerbation of COPD.  Plan to continue current regimen including albuterol as needed.  Add short course of oral prednisone and backup antibiotic prescription if not improving.  Recheck if needed.  Follow-up with pulmonology if able.  Abdominal pain: Likely Crohn's exacerbation.  Short course of prednisone use for COPD exacerbation likely help as well.  Consider follow back up with gastroenterology..  Hyperlipidemia: Check direct LDL today on Crestor.  Recheck 3 months or so with new PCP.  PDMP not reviewed this encounter. Orders Placed This Encounter  Procedures  . COMPLETE METABOLIC PANEL WITH GFR  . CBC with Differential/Platelet  . LDL cholesterol, direct   Meds ordered this encounter  Medications  . predniSONE (DELTASONE) 50 MG tablet    Sig: Take 1 tablet (50 mg total) by mouth daily.    Dispense:  5 tablet    Refill:  0  . azithromycin (ZITHROMAX) 250 MG tablet  Sig: Take 1 tablet (250 mg total) by mouth daily. Take first 2 tablets together, then 1 every day until finished.    Dispense:  6 tablet    Refill:  0     Historical information moved to improve visibility of documentation.  Past Medical History:  Diagnosis Date  . COPD with acute bronchitis ([place]) 11/11/2010   Qualifier: Diagnosis of  By: Joya Gaskins MD, Burnett Harry   . Crohn's ileitis ([place]) 09/15/2015   Overview:  GI   . Diverticula of colon 08/30/2018   [facility]  . MI (myocardial infarction) ([place])   . Tubular  adenoma of colon 08/30/2018   [facility]   Past Surgical History:  Procedure Laterality Date  . CERVICAL FUSION    . CORONARY ANGIOPLASTY WITH STENT PLACEMENT     Social History   Tobacco Use  . Smoking status: Current Every Day Smoker    Packs/day: 1.50    Types: Cigarettes  . Smokeless tobacco: Never Used  Substance Use Topics  . Alcohol use: Yes   family history includes Heart disease in his father; Hypertension in his father.  Medications: Current Outpatient Medications  Medication Sig Dispense Refill  . albuterol (PROVENTIL HFA;VENTOLIN HFA) 108 (90 Base) MCG/ACT inhaler Inhale into the lungs every 6 (six) hours as needed.    Marland Kitchen albuterol (PROVENTIL) (2.5 MG/3ML) 0.083% nebulizer solution Take 3 mLs (2.5 mg total) by nebulization every 6 (six) hours as needed for wheezing or shortness of breath. 150 mL 1  . aspirin 81 MG tablet Take 81 mg by mouth daily.    . fluticasone (FLONASE) 50 MCG/ACT nasal spray One spray in each nostril twice a day, use left hand for right nostril, and right hand for left nostril. 16 g 3  . promethazine (PHENERGAN) 25 MG tablet Take 1 tablet (25 mg total) by mouth every 8 (eight) hours as needed for nausea or vomiting. 30 tablet 3  . rosuvastatin (CRESTOR) 20 MG tablet Take 20 mg by mouth daily.    . Tiotropium Bromide-Olodaterol 2.5-2.5 MCG/ACT AERS Inhale 2 puffs into the lungs daily.    Marland Kitchen azithromycin (ZITHROMAX) 250 MG tablet Take 1 tablet (250 mg total) by mouth daily. Take first 2 tablets together, then 1 every day until finished. 6 tablet 0  . predniSONE (DELTASONE) 50 MG tablet Take 1 tablet (50 mg total) by mouth daily. 5 tablet 0   No current facility-administered medications for this visit.    No Known Allergies   Discussed warning signs or symptoms. Please see discharge instructions. Patient expresses understanding.

## 2019-09-09 NOTE — Patient Instructions (Addendum)
Thank you for coming in today. Get blood work today.  STOP atenolol.  Continue inhalers.  Take course of prednisone.  If not doing better fill and start taking the printed antibiotic medicine.  Consider follow up lung doctor at [facility] and stomach doctor.  Keep me updated.   If light headedness does not improve with stopping atenolol let me know. Consider recheck with cardiology.   I will be moving to full time Sports Medicine in [place] starting on November 1st.  You will still be able to see me for your Sports Medicine or Orthopedic needs at [facility] in [place]. I will still be part of [facility].    If you want to stay locally for your Sports Medicine issues Dr. Dianah Field here in [place] will be happy to see you.  Additionally Dr. Clearance Coots at [facility] will be happy to see you for sports medicine issues more locally.   For your primary care needs you are welcome to establish care with Dr. Emeterio Reeve.  Dr Luetta Nutting will be starting in the new year and a new NP Caryl Asp will be starting in December.  We are working quickly to hire more physicians to cover the primary care needs however if you cannot get an appointment with Dr. Sheppard Coil in a timely manner [facility] has locations and openings for primary care services nearby.   [facility] at [facility] [address] . [employer] , [place]: [phone] . Behavioral Medicine: [phone] . Fax: [facility] at [employer] [address] . [place], [place]: [phone] . Behavioral Medicine: [phone] . Fax: [phone] . Hours (M-F): 7am - Academic librarian At [facility]. [place], [place]: [phone] . Behavioral Medicine: [phone] . Fax: [phone] . Hours (M-F): 8am - Optician, dispensing at [employer] . [place], [place] Phone: [phone] . Behavioral Medicine: [phone] . Fax: [phone]

## 2019-09-10 LAB — CBC WITH DIFFERENTIAL/PLATELET
Absolute Monocytes: 836 cells/uL (ref 200–950)
Basophils Absolute: 153 cells/uL (ref 0–200)
Basophils Relative: 1.5 %
Eosinophils Absolute: 439 cells/uL (ref 15–500)
Eosinophils Relative: 4.3 %
HCT: 47.1 % (ref 38.5–50.0)
Hemoglobin: 15.6 g/dL (ref 13.2–17.1)
Lymphs Abs: 1938 cells/uL (ref 850–3900)
MCH: 31.4 pg (ref 27.0–33.0)
MCHC: 33.1 g/dL (ref 32.0–36.0)
MCV: 94.8 fL (ref 80.0–100.0)
MPV: 9.3 fL (ref 7.5–12.5)
Monocytes Relative: 8.2 %
Neutro Abs: 6834 cells/uL (ref 1500–7800)
Neutrophils Relative %: 67 %
Platelets: 316 10*3/uL (ref 140–400)
RBC: 4.97 10*6/uL (ref 4.20–5.80)
RDW: 13.1 % (ref 11.0–15.0)
Total Lymphocyte: 19 %
WBC: 10.2 10*3/uL (ref 3.8–10.8)

## 2019-09-10 LAB — COMPLETE METABOLIC PANEL WITH GFR
AG Ratio: 1.5 (calc) (ref 1.0–2.5)
ALT: 15 U/L (ref 9–46)
AST: 14 U/L (ref 10–35)
Albumin: 3.8 g/dL (ref 3.6–5.1)
Alkaline phosphatase (APISO): 59 U/L (ref 35–144)
BUN: 14 mg/dL (ref 7–25)
CO2: 27 mmol/L (ref 20–32)
Calcium: 8.9 mg/dL (ref 8.6–10.3)
Chloride: 103 mmol/L (ref 98–110)
Creat: 1.18 mg/dL (ref 0.70–1.18)
GFR, Est African American: 72 mL/min/{1.73_m2} (ref >=60)
GFR, Est Non African American: 62 mL/min/{1.73_m2} (ref >=60)
Globulin: 2.5 g/dL (calc) (ref 1.9–3.7)
Glucose, Bld: 99 mg/dL (ref 65–99)
Potassium: 4.7 mmol/L (ref 3.5–5.3)
Sodium: 135 mmol/L (ref 135–146)
Total Bilirubin: 0.5 mg/dL (ref 0.2–1.2)
Total Protein: 6.3 g/dL (ref 6.1–8.1)

## 2019-09-10 LAB — LDL CHOLESTEROL, DIRECT: Direct LDL: 57 mg/dL (ref <100)

## 2019-09-23 ENCOUNTER — Telehealth: Payer: Self-pay

## 2019-09-23 MED ORDER — AZELASTINE HCL 0.1 % NA SOLN: 2.0000 | Freq: Two times a day (BID) | NASAL | 12 refills | Status: DC

## 2019-09-23 NOTE — Telephone Encounter (Signed)
Left message advising of the prescription.

## 2019-09-23 NOTE — Telephone Encounter (Signed)
David Woodard left a message stating he has nasal congestion and would like a nasal spray. He denies other symptoms. He would like the same spray as last time his nose was congested.

## 2019-09-23 NOTE — Telephone Encounter (Signed)
I believe he is referring to Astelin nasal spray.  Prescription sent to [facility] in [place].

## 2019-09-24 DIAGNOSIS — Z7982 Long term (current) use of aspirin: Secondary | ICD-10-CM | POA: Diagnosis not present

## 2019-09-24 DIAGNOSIS — R079 Chest pain, unspecified: Secondary | ICD-10-CM | POA: Diagnosis not present

## 2019-09-24 DIAGNOSIS — R000 Tachycardia, unspecified: Secondary | ICD-10-CM | POA: Diagnosis not present

## 2019-09-24 DIAGNOSIS — R0781 Pleurodynia: Secondary | ICD-10-CM | POA: Diagnosis not present

## 2019-09-24 DIAGNOSIS — R0602 Shortness of breath: Secondary | ICD-10-CM | POA: Diagnosis not present

## 2019-09-24 DIAGNOSIS — J449 Chronic obstructive pulmonary disease, unspecified: Secondary | ICD-10-CM | POA: Diagnosis not present

## 2019-09-24 DIAGNOSIS — R42 Dizziness and giddiness: Secondary | ICD-10-CM | POA: Diagnosis not present

## 2019-09-24 DIAGNOSIS — J441 Chronic obstructive pulmonary disease with (acute) exacerbation: Secondary | ICD-10-CM | POA: Diagnosis not present

## 2019-09-24 DIAGNOSIS — Z79899 Other long term (current) drug therapy: Secondary | ICD-10-CM | POA: Diagnosis not present

## 2019-09-24 MED ORDER — BARO-CAT PO
10.00 | ORAL | Status: DC
Start: ? — End: 2019-09-24

## 2019-09-24 MED ORDER — Medication
Status: DC
Start: ? — End: 2019-09-24

## 2019-10-23 ENCOUNTER — Encounter: Payer: Self-pay | Admitting: Sports Medicine

## 2019-10-23 ENCOUNTER — Ambulatory Visit (INDEPENDENT_AMBULATORY_CARE_PROVIDER_SITE_OTHER): Payer: Medicare Other | Admitting: Sports Medicine

## 2019-10-23 DIAGNOSIS — R0981 Nasal congestion: Secondary | ICD-10-CM

## 2019-10-23 MED ORDER — BENZEDREX NA INHA: 1.0000 | Freq: Four times a day (QID) | NASAL | 0 refills | Status: DC | PRN

## 2019-10-23 MED ORDER — PREDNISONE 10 MG (48) PO TBPK: ORAL_TABLET | Freq: Every day | ORAL | 0 refills | Status: DC

## 2019-10-23 NOTE — Assessment & Plan Note (Signed)
Congestion, stuffiness, no facial pain or pressure, only a mild sore throat, no fevers, chills, shortness of breath, skin rash, GI symptoms.  Adding short course of Benzedrex and a 12-day prednisone taper. His symptoms from the prior COPD exacerbation have completely resolved.

## 2019-10-23 NOTE — Progress Notes (Signed)
Virtual Visit via Telephone   I connected with  David Woodard  on 10/23/19 by telephone/telehealth and verified that I am speaking with the correct person using two identifiers.   I discussed the limitations, risks, security and privacy concerns of performing an evaluation and management service by telephone, including the higher likelihood of inaccurate diagnosis and treatment, and the availability of in person appointments.  We also discussed the likely need of an additional face to face encounter for complete and high quality delivery of care.  I also discussed with the patient that there may be a patient responsible charge related to this service. The patient expressed understanding and wishes to proceed.  Provider location is either at home or medical facility. Patient location is at their home, different from provider location. People involved in care of the patient during this telehealth encounter were myself, my nurse/medical assistant, and my front office/scheduling team member.  Subjective:    CC: Nasal stuffiness  HPI: See assessment and plan for further details.  I reviewed the past medical history, family history, social history, surgical history, and allergies today and no changes were needed.  Please see the problem list section below in epic for further details.  Past Medical History: Past Medical History:  Diagnosis Date  . COPD with acute bronchitis ([place]) 11/11/2010   Qualifier: Diagnosis of  By: Joya Gaskins MD, Burnett Harry   . Crohn's ileitis ([place]) 09/15/2015   Overview:  GI   . Diverticula of colon 08/30/2018   [facility]  . MI (myocardial infarction) ([place])   . Tubular adenoma of colon 08/30/2018   [facility]   Past Surgical History: Past Surgical History:  Procedure Laterality Date  . CERVICAL FUSION    . CORONARY ANGIOPLASTY WITH STENT PLACEMENT     Social History: Social History    Socioeconomic History  . Marital status: Single    Spouse name: Not on file  . Number of children: Not on file  . Years of education: Not on file  . Highest education level: Not on file  Occupational History  . Not on file  Social Needs  . Financial resource strain: Not on file  . Food insecurity    Worry: Not on file    Inability: Not on file  . Transportation needs    Medical: Not on file    Non-medical: Not on file  Tobacco Use  . Smoking status: Current Every Day Smoker    Packs/day: 1.50    Types: Cigarettes  . Smokeless tobacco: Never Used  Substance and Sexual Activity  . Alcohol use: Yes  . Drug use: No  . Sexual activity: Not on file  Lifestyle  . Physical activity    Days per week: Not on file    Minutes per session: Not on file  . Stress: Not on file  Relationships  . Social Herbalist on phone: Not on file    Gets together: Not on file    Attends religious service: Not on file    Active member of club or organization: Not on file    Attends meetings of clubs or organizations: Not on file    Relationship status: Not on file  Other Topics Concern  . Not on file  Social History Narrative  . Not on file   Family History: Family History  Problem Relation Age of Onset  . Hypertension Father   . Heart disease Father  Allergies: No Known Allergies Medications: See med rec.  Review of Systems: No fevers, chills, night sweats, weight loss, chest pain, or shortness of breath.   Objective:    General: Speaking full sentences, no audible heavy breathing.  Sounds alert and appropriately interactive.  No other physical exam performed due to the non-face to face nature of this visit.  Impression and Recommendations:    Nasal congestion Congestion, stuffiness, no facial pain or pressure, only a mild sore throat, no fevers, chills, shortness of breath, skin rash, GI symptoms.  Adding short course of Benzedrex and a 12-day prednisone taper. His  symptoms from the prior COPD exacerbation have completely resolved.   I discussed the above assessment and treatment plan with the patient. The patient was provided an opportunity to ask questions and all were answered. The patient agreed with the plan and demonstrated an understanding of the instructions.   The patient was advised to call back or seek an in-person evaluation if the symptoms worsen or if the condition fails to improve as anticipated.   I provided 25 minutes of non-face-to-face time during this encounter, 15 minutes of additional time was needed to gather information, review chart, records, communicate/coordinate with staff remotely, and complete documentation.   ___________________________________________ Gwen Her. Dianah Field, M.D., ABFM., CAQSM. Primary Care and Sports Medicine  [facility]  Adjunct Professor of [place] [employer] [employer] [facility] [employer]

## 2019-11-28 DIAGNOSIS — J441 Chronic obstructive pulmonary disease with (acute) exacerbation: Secondary | ICD-10-CM | POA: Diagnosis not present

## 2019-11-28 DIAGNOSIS — J439 Emphysema, unspecified: Secondary | ICD-10-CM | POA: Diagnosis not present

## 2019-11-28 DIAGNOSIS — Z7951 Long term (current) use of inhaled steroids: Secondary | ICD-10-CM | POA: Diagnosis not present

## 2019-11-28 DIAGNOSIS — R0602 Shortness of breath: Secondary | ICD-10-CM | POA: Diagnosis not present

## 2019-11-28 DIAGNOSIS — R079 Chest pain, unspecified: Secondary | ICD-10-CM | POA: Diagnosis not present

## 2019-11-28 DIAGNOSIS — Z79899 Other long term (current) drug therapy: Secondary | ICD-10-CM | POA: Diagnosis not present

## 2019-11-28 DIAGNOSIS — R918 Other nonspecific abnormal finding of lung field: Secondary | ICD-10-CM | POA: Diagnosis not present

## 2019-11-28 DIAGNOSIS — Z7982 Long term (current) use of aspirin: Secondary | ICD-10-CM | POA: Diagnosis not present

## 2019-11-28 MED ORDER — SODIUM CHLORIDE 0.9 % IV SOLN
10.00 | INTRAVENOUS | Status: DC
Start: ? — End: 2019-11-28

## 2019-11-28 MED ORDER — GENERIC EXTERNAL MEDICATION
Status: DC
Start: ? — End: 2019-11-28

## 2019-12-11 ENCOUNTER — Other Ambulatory Visit: Payer: Self-pay

## 2019-12-11 ENCOUNTER — Ambulatory Visit (INDEPENDENT_AMBULATORY_CARE_PROVIDER_SITE_OTHER): Payer: Medicare Other | Admitting: Osteopathic Medicine

## 2019-12-11 ENCOUNTER — Encounter: Payer: Self-pay | Admitting: Osteopathic Medicine

## 2019-12-11 VITALS — BP 146/84 | HR 90 | Temp 98.1°F | Wt 209.1 lb

## 2019-12-11 DIAGNOSIS — I252 Old myocardial infarction: Secondary | ICD-10-CM

## 2019-12-11 DIAGNOSIS — F17200 Nicotine dependence, unspecified, uncomplicated: Secondary | ICD-10-CM

## 2019-12-11 DIAGNOSIS — I2510 Atherosclerotic heart disease of native coronary artery without angina pectoris: Secondary | ICD-10-CM | POA: Diagnosis not present

## 2019-12-11 DIAGNOSIS — E785 Hyperlipidemia, unspecified: Secondary | ICD-10-CM

## 2019-12-11 DIAGNOSIS — R110 Nausea: Secondary | ICD-10-CM

## 2019-12-11 DIAGNOSIS — J449 Chronic obstructive pulmonary disease, unspecified: Secondary | ICD-10-CM | POA: Diagnosis not present

## 2019-12-11 MED ORDER — PROMETHAZINE HCL 25 MG PO TABS: 25.0000 mg | ORAL_TABLET | Freq: Three times a day (TID) | ORAL | 3 refills | Status: DC | PRN

## 2019-12-11 NOTE — Patient Instructions (Signed)
I'd suggest getting back in with your lung doctor at the [place].   Your oxygen levels here in the office looks good. So, despite the feeling of shortness of breath, your body iss till getting enough oxygen which is good news.   Let's plan to recheck blood work once per year, will be due 08/2020, un;ess you get your annual check-ups through the [place].   We should keep an eye on your blood pressure. Goal should be 130/80 or less!

## 2019-12-11 NOTE — Progress Notes (Signed)
David Woodard is a 69 y.o. male who presents to  [place] at [facility]  today, 12/11/19, seeking care for the following:  The primary encounter diagnosis was COPD (chronic obstructive pulmonary disease) with chronic bronchitis ([place]). Diagnoses of Coronary artery disease with history of myocardial infarction without history of CABG, Dyslipidemia, Smoking, and Nausea were also pertinent to this visit.   ASSESSMENT & PLAN with other pertinent history/findings:  1. COPD (chronic obstructive pulmonary disease) with chronic bronchitis ([facility]) 4-5 exacerbations per year requiring steroids/ER visits  Continues to smoke (+)SOB w/ ADL. No CP/claudication.  6-minute walk test today: never dropped below 95% SaO2 on RA at a walk Will have him follow-up with his pulmonologist at the [facility] Consider sleep study for OSA/ need O2 qhs?  Consider alternative inhaler?   2. Coronary artery disease with history of myocardial infarction without history of CABG discussed importance of BP control, lipids at goal, quit smokeing  3. Dyslipidemia LDL at goal as of 08/2019: 57  4. Smoking Stop!   5. Nausea Refilled phenergan      Patient Instructions  I'd suggest getting back in with your lung doctor at the [place].   Your oxygen levels here in the office looks good. So, despite the feeling of shortness of breath, your body iss till getting enough oxygen which is good news.   Let's plan to recheck blood work once per year, will be due 08/2020, un;ess you get your annual check-ups through the [place].   We should keep an eye on your blood pressure. Goal should be 130/80 or less!        Follow-up instructions: Return for ANNUAL (call week prior to visit for lab orders) around 08/2020.      BP (!) 146/84 (BP Location: Left Arm, Patient Position: Sitting, Cuff Size: Normal)   Pulse 90   Temp 98.1 F (36.7 C) (Oral)   Wt 209 lb 1.3 oz (94.8 kg)   SpO2 98%    BMI 30.00 kg/m   Current Meds  Medication Sig  . albuterol (PROVENTIL HFA;VENTOLIN HFA) 108 (90 Base) MCG/ACT inhaler Inhale into the lungs every 6 (six) hours as needed.  Marland Kitchen albuterol (PROVENTIL) (2.5 MG/3ML) 0.083% nebulizer solution Take 3 mLs (2.5 mg total) by nebulization every 6 (six) hours as needed for wheezing or shortness of breath.  Marland Kitchen aspirin 81 MG tablet Take 81 mg by mouth daily.  Marland Kitchen azelastine (ASTELIN) 0.1 % nasal spray Place 2 sprays into both nostrils 2 (two) times daily. Use in each nostril as directed  . fluticasone (FLONASE) 50 MCG/ACT nasal spray One spray in each nostril twice a day, use left hand for right nostril, and right hand for left nostril.  . promethazine (PHENERGAN) 25 MG tablet Take 1 tablet (25 mg total) by mouth every 8 (eight) hours as needed for nausea or vomiting.  Marland Kitchen Propylhexedrine (BENZEDREX) INHA Place 1 each into the nose every 6 (six) hours as needed.  . rosuvastatin (CRESTOR) 20 MG tablet Take 20 mg by mouth daily.  . Tiotropium Bromide-Olodaterol 2.5-2.5 MCG/ACT AERS Inhale 2 puffs into the lungs daily.    No results found for this or any previous visit (from the past 72 hour(s)).  No results found.   All questions at time of visit were answered - patient instructed to contact office with any additional concerns or updates.  ER/RTC precautions were reviewed with the patient.  Please note: voice recognition software was used to produce this  document, and typos may escape review. Please contact Dr. Sheppard Coil for any needed clarifications.

## 2020-01-10 ENCOUNTER — Other Ambulatory Visit: Payer: Self-pay

## 2020-01-10 ENCOUNTER — Encounter: Payer: Self-pay | Admitting: Osteopathic Medicine

## 2020-01-10 ENCOUNTER — Ambulatory Visit (INDEPENDENT_AMBULATORY_CARE_PROVIDER_SITE_OTHER): Payer: Medicare Other | Admitting: Osteopathic Medicine

## 2020-01-10 VITALS — BP 150/92 | HR 92 | Temp 97.5°F | Wt 215.0 lb

## 2020-01-10 DIAGNOSIS — J449 Chronic obstructive pulmonary disease, unspecified: Secondary | ICD-10-CM | POA: Diagnosis not present

## 2020-01-10 NOTE — Patient Instructions (Signed)
Let us know who your lung doctor is Call Tanna Furry: [phone]  Ask the [place] about the COVID vaccine!

## 2020-01-10 NOTE — Progress Notes (Signed)
David Woodard is a 69 y.o. male who presents to  [place] at [facility]  today, 01/10/20, seeking care for the following:  The encounter diagnosis was Chronic obstructive pulmonary disease, unspecified COPD type ([place]).   ASSESSMENT & PLAN with other pertinent history/findings:  COPD:  Chronic, poorly controlled/severe but overall stable at this time Having multiple exacerbations over the past few months  [facility]  Will get back to [place] w/ Pulm's contact info Has declined local referral for second opinion Multiple 6-min walk tests show NO SaO2 desaturations Advised home O2 monitor for reassurance  Would consider chronic steroids He's not meeting criteria for O2 supplementation, but sleep study might show something - worth exploring, pt will think about this Meds same for now I'll try to reach out to Pulm re: steroids, possibly making inhalers bid  I think anxiety is playing a role here, understandably stressed when SOB     No orders of the defined types were placed in this encounter.   No orders of the defined types were placed in this encounter.   Patient Instructions  Let us know who your lung doctor is Call Tanna Furry: [phone]  Ask the [place] about the COVID vaccine!        Follow-up instructions: Return if symptoms worsen or fail to improve.                BP (!) 150/92 (BP Location: Left Arm, Patient Position: Sitting, Cuff Size: Normal)   Pulse 92   Temp (!) 97.5 F (36.4 C) (Oral)   Wt 215 lb 0.6 oz (97.5 kg)   SpO2 97%   BMI 30.86 kg/m   No outpatient medications have been marked as taking for the 01/10/20 encounter (Office Visit) with Emeterio Reeve, DO.    No results found for this or any previous visit (from the past 72 hour(s)).  No results found.  Depression screen [facility] 2/9 11/15/2018 08/02/2018 06/07/2017  Decreased Interest 0 0 0   Down, Depressed, Hopeless 3 0 2  PHQ - 2 Score 3 0 2  Altered sleeping 2 - 2  Tired, decreased energy - - 1  Change in appetite 0 - 1  Feeling bad or failure about yourself  0 - 0  Trouble concentrating 0 - 0  Moving slowly or fidgety/restless 0 - 0  Suicidal thoughts 0 - 0  PHQ-9 Score 5 - 6  Difficult doing work/chores Somewhat difficult - -    GAD 7 : Generalized Anxiety Score 11/15/2018 06/07/2017  Nervous, Anxious, on Edge 0 0  Control/stop worrying 0 1  Worry too much - different things 0 0  Trouble relaxing 1 0  Restless 1 0  Easily annoyed or irritable 1 1  Afraid - awful might happen 0 0  Total GAD 7 Score 3 2  Anxiety Difficulty Somewhat difficult -      All questions at time of visit were answered - patient instructed to contact office with any additional concerns or updates.  ER/RTC precautions were reviewed with the patient.  Please note: voice recognition software was used to produce this document, and typos may escape review. Please contact Dr. Sheppard Coil for any needed clarifications.   Total encounter time: 30 minutes.

## 2020-01-15 ENCOUNTER — Telehealth: Payer: Self-pay

## 2020-01-15 NOTE — Telephone Encounter (Signed)
Pt left a vm msg regarding pulmonologist information. As per pt, he currently sees:   Dr. Danton Sewer [phone] ext: [phone]  Pt also mentioned that he will be getting his first Covid vaccine injection on 01/17/20. No other inquiries mentioned during call.

## 2020-01-16 NOTE — Telephone Encounter (Signed)
Excellent! Will try to reach out to them when back in office tomorrow (office closed today d/t weather, we are working from home) - they are the folks that ideally should be managing his COPD

## 2020-01-17 NOTE — Telephone Encounter (Signed)
Left a detailed vm msg for David Woodard - Dr. Sabino Donovan direct nurse. Aware provider is requesting for pt to be seen in for COPD issues with pulmonologist. Direct call back info provided.

## 2020-01-17 NOTE — Telephone Encounter (Signed)
Can we call their office to see if they can schedule him for COPD follow-up sooner than his routine check-up, I think his inhalers need adjusted, +/- needs home O2, chronic steroids. Thanks!

## 2020-01-25 MED ORDER — GENERIC EXTERNAL MEDICATION
Status: DC
Start: ? — End: 2020-01-25

## 2020-01-25 MED ORDER — SODIUM CHLORIDE 0.9 % IV SOLN
10.00 | INTRAVENOUS | Status: DC
Start: ? — End: 2020-01-25

## 2020-02-17 ENCOUNTER — Other Ambulatory Visit: Payer: Self-pay

## 2020-02-17 ENCOUNTER — Ambulatory Visit (INDEPENDENT_AMBULATORY_CARE_PROVIDER_SITE_OTHER): Payer: Medicare Other | Admitting: Osteopathic Medicine

## 2020-02-17 ENCOUNTER — Encounter: Payer: Self-pay | Admitting: Osteopathic Medicine

## 2020-02-17 VITALS — BP 165/89 | HR 103 | Ht 70.0 in | Wt 221.0 lb

## 2020-02-17 DIAGNOSIS — J449 Chronic obstructive pulmonary disease, unspecified: Secondary | ICD-10-CM

## 2020-02-17 DIAGNOSIS — F411 Generalized anxiety disorder: Secondary | ICD-10-CM | POA: Diagnosis not present

## 2020-02-17 DIAGNOSIS — F410 Panic disorder [episodic paroxysmal anxiety] without agoraphobia: Secondary | ICD-10-CM

## 2020-02-17 MED ORDER — ESCITALOPRAM OXALATE 10 MG PO TABS: ORAL_TABLET | ORAL | 0 refills | Status: DC

## 2020-02-17 MED ORDER — HYDROXYZINE HCL 25 MG PO TABS: 25.0000 mg | ORAL_TABLET | Freq: Three times a day (TID) | ORAL | 1 refills | Status: DC | PRN

## 2020-02-17 NOTE — Progress Notes (Signed)
David Woodard is a 69 y.o. male who presents to  [place] at [facility]  today, 02/17/20, seeking care for the following: Chief Complaint  Patient presents with  . Hospitalization Follow-up    anxiety, abdominal pain        ASSESSMENT & PLAN with other pertinent history/findings:  The primary encounter diagnosis was Chronic obstructive pulmonary disease, unspecified COPD type ([place]). A diagnosis of Generalized anxiety disorder with panic attacks was also pertinent to this visit.  Multiple ER visits (6 total per records) since last visit w/ me a little over a month ago. Complaints of SOB, Dizziness, abdominal pain, panic. Known COPD. Crohn's/Diverticulosis, and anxiety.   ER records reviewed: multiple EKG, imaging, labs, all ok   Today reports doing ok, the hydroxyzine is helping - consistently taking it tid. Having some trouble sleeping. Anxious still, but not as badly.   On exam, lungs diminished breath sounds bilaterally but otherwise no, no rales/ronchi. Normal RR. CV: RRR, S1S2 normal, no murmur   Pt is amenable to starting SSRI and using the hydroxyzine prn.   Duonebs helped in ED, this was Rx for him but too expensive in [place] pharmacy. Needs to follow up with his [place] pulmonologist.       Patient Instructions  We are starting a medication today called Lexapro to help treat your anxiety. This is a daily medication to help control your symptoms.   Try the as-needed medicine, hydroxyzine, up to 3 times per day. Take every day for 2 weeks, then once you're on 10 mg of the Lexapro, can use the hydroxyzine as-needed.   Let's plan to follow up in the office in 4 weeks. At that time, we can talk about how well the medicine is working for you, and we can consider increasing the dose, adding another medicine, etc.   If you experience problematic side effects or other problems, please let me know ASAP - we can switch the medicine any  time, and we don't need an appointment for this.   Any questions or concerns, call me!       No orders of the defined types were placed in this encounter.   Meds ordered this encounter  Medications  . escitalopram (LEXAPRO) 10 MG tablet    Sig: Take 0.5 tablets (5 mg total) by mouth at bedtime for 14 days, THEN 1 tablet (10 mg total) at bedtime.    Dispense:  90 tablet    Refill:  0  . hydrOXYzine (ATARAX/VISTARIL) 25 MG tablet    Sig: Take 1 tablet (25 mg total) by mouth every 8 (eight) hours as needed for anxiety.    Dispense:  90 tablet    Refill:  1       Follow-up instructions: Return in about 4 weeks (around 03/16/2020) for IN-OFFICE VISIT, RECHECK ON NEW MEDICATION FOR MENTAL HEALTH .                                         BP (!) 165/89   Pulse (!) 103   Ht 5' 10"  (1.778 m)   Wt 221 lb (100.2 kg)   BMI 31.71 kg/m   Current Meds  Medication Sig  . albuterol (PROVENTIL HFA;VENTOLIN HFA) 108 (90 Base) MCG/ACT inhaler Inhale into the lungs every 6 (six) hours as needed.  Marland Kitchen albuterol (PROVENTIL) (2.5 MG/3ML) 0.083% nebulizer solution  Take 3 mLs (2.5 mg total) by nebulization every 6 (six) hours as needed for wheezing or shortness of breath.  Marland Kitchen aspirin 81 MG tablet Take 81 mg by mouth daily.  . hydrOXYzine (ATARAX/VISTARIL) 25 MG tablet Take 1 tablet (25 mg total) by mouth every 8 (eight) hours as needed for anxiety.  . promethazine (PHENERGAN) 25 MG tablet Take 1 tablet (25 mg total) by mouth every 8 (eight) hours as needed for nausea or vomiting.  . rosuvastatin (CRESTOR) 20 MG tablet Take 20 mg by mouth daily.  . Tiotropium Bromide-Olodaterol 2.5-2.5 MCG/ACT AERS Inhale 2 puffs into the lungs daily.  . [DISCONTINUED] hydrOXYzine (ATARAX/VISTARIL) 25 MG tablet Take 25 mg by mouth every 8 (eight) hours as needed.    No results found for this or any previous visit (from the past 72 hour(s)).  No results found.  Depression  screen [facility] 2/9 02/17/2020 11/15/2018 08/02/2018  Decreased Interest 0 0 0  Down, Depressed, Hopeless 1 3 0  PHQ - 2 Score 1 3 0  Altered sleeping 3 2 -  Tired, decreased energy 1 - -  Change in appetite 1 0 -  Feeling bad or failure about yourself  0 0 -  Trouble concentrating 0 0 -  Moving slowly or fidgety/restless 0 0 -  Suicidal thoughts 0 0 -  PHQ-9 Score 6 5 -  Difficult doing work/chores - Somewhat difficult -    GAD 7 : Generalized Anxiety Score 02/17/2020 11/15/2018 06/07/2017  Nervous, Anxious, on Edge 1 0 0  Control/stop worrying 0 0 1  Worry too much - different things 1 0 0  Trouble relaxing 1 1 0  Restless 1 1 0  Easily annoyed or irritable 0 1 1  Afraid - awful might happen 0 0 0  Total GAD 7 Score 4 3 2   Anxiety Difficulty - Somewhat difficult -      All questions at time of visit were answered - patient instructed to contact office with any additional concerns or updates.  ER/RTC precautions were reviewed with the patient.  Please note: voice recognition software was used to produce this document, and typos may escape review. Please contact Dr. Sheppard Coil for any needed clarifications.

## 2020-02-17 NOTE — Patient Instructions (Addendum)
We are starting a medication today called Lexapro to help treat your anxiety. This is a daily medication to help control your symptoms.   Try the as-needed medicine, hydroxyzine, up to 3 times per day. Take every day for 2 weeks, then once you're on 10 mg of the Lexapro, can use the hydroxyzine as-needed.   Let's plan to follow up in the office in 4 weeks. At that time, we can talk about how well the medicine is working for you, and we can consider increasing the dose, adding another medicine, etc.   If you experience problematic side effects or other problems, please let me know ASAP - we can switch the medicine any time, and we don't need an appointment for this.   Any questions or concerns, call me!

## 2020-02-21 ENCOUNTER — Encounter: Payer: Self-pay | Admitting: Medical-Surgical

## 2020-02-21 ENCOUNTER — Ambulatory Visit (INDEPENDENT_AMBULATORY_CARE_PROVIDER_SITE_OTHER): Payer: Medicare Other | Admitting: Medical-Surgical

## 2020-02-21 ENCOUNTER — Other Ambulatory Visit: Payer: Self-pay

## 2020-02-21 VITALS — BP 151/84 | HR 104 | Temp 97.8°F | Ht 70.0 in | Wt 220.0 lb

## 2020-02-21 DIAGNOSIS — Z09 Encounter for follow-up examination after completed treatment for conditions other than malignant neoplasm: Secondary | ICD-10-CM | POA: Diagnosis not present

## 2020-02-21 DIAGNOSIS — F17200 Nicotine dependence, unspecified, uncomplicated: Secondary | ICD-10-CM

## 2020-02-21 DIAGNOSIS — J189 Pneumonia, unspecified organism: Secondary | ICD-10-CM

## 2020-02-21 NOTE — Progress Notes (Signed)
Subjective:    CC: Hospital follow-up for pneumonia and anxiety  HPI: Pleasant 69 year old male presenting today for hospital follow-up from 02/19/2020 at [place] ED.  Was seen for right lower lobe pneumonia and anxiety.  Was started on prednisone taper pack and doxycycline twice daily which he has been taking as prescribed.  Continues to be significantly short of breath especially with exertion.  Denies chest pain, fever, chills.  Frequent coughing, occasionally productive.  Still smoking 1.5 packs/day but reports being very interested in smoking cessation.  Has previously tried to quit before, successful for 6 months using cold [place] method but then restarted.  Has since tried patches, gum, and a pill he does not know the name of, all failed.  Using prescribed inhalers correctly without missed doses.  Anxiety-taking escitalopram and hydroxyzine.  Reports the hydroxyzine seems to help some.  Seeing no benefit from the escitalopram yet.  Cannot pinpoint any trigger.  From description panic attacks start with dizziness followed by increase in anxiety.  Has been worsening over the last 2 weeks and prompted several visits to the ED/call to EMS.  No counseling but is interested in establishing a counseling relationship if available through the [place].  I reviewed the past medical history, family history, social history, surgical history, and allergies today and no changes were needed.  Please see the problem list section below in epic for further details.  Past Medical History: Past Medical History:  Diagnosis Date  . COPD with acute bronchitis ([facility]) 11/11/2010   Qualifier: Diagnosis of  By: Joya Gaskins MD, Burnett Harry   . Crohn's ileitis ([facility]) 09/15/2015   Overview:  GI   . Diverticula of colon 08/30/2018   [facility]  . MI (myocardial infarction) ([facility])   . Tubular adenoma of colon 08/30/2018   [facility]   Past  Surgical History: Past Surgical History:  Procedure Laterality Date  . CERVICAL FUSION    . CORONARY ANGIOPLASTY WITH STENT PLACEMENT     Social History: Social History   Socioeconomic History  . Marital status: Single    Spouse name: Not on file  . Number of children: Not on file  . Years of education: Not on file  . Highest education level: Not on file  Occupational History  . Not on file  Tobacco Use  . Smoking status: Current Every Day Smoker    Packs/day: 1.75    Types: Cigarettes  . Smokeless tobacco: Never Used  Substance and Sexual Activity  . Alcohol use: Yes  . Drug use: No  . Sexual activity: Not on file  Other Topics Concern  . Not on file  Social History Narrative  . Not on file   Social Determinants of Health   Financial Resource Strain:   . Difficulty of Paying Living Expenses:   Food Insecurity:   . Worried About Charity fundraiser in the Last Year:   . Arboriculturist in the Last Year:   Transportation Needs:   . Film/video editor (Medical):   Marland Kitchen Lack of Transportation (Non-Medical):   Physical Activity:   . Days of Exercise per Week:   . Minutes of Exercise per Session:   Stress:   . Feeling of Stress :   Social Connections:   . Frequency of Communication with Friends and Family:   . Frequency of Social Gatherings with Friends and Family:   . Attends Religious Services:   . Active Member  of Clubs or Organizations:   . Attends Archivist Meetings:   Marland Kitchen Marital Status:    Family History: Family History  Problem Relation Age of Onset  . Hypertension Father   . Heart disease Father    Allergies: No Known Allergies Medications: See med rec.  Review of Systems: No fevers, chills, night sweats, weight loss, chest pain.   Objective:    General: Well Developed, well nourished, and in no acute distress.  Neuro: Alert and oriented x3.  HEENT: Normocephalic, atraumatic.  Skin: Warm and dry, no rashes. Cardiac: Regular rate  and rhythm, no murmurs rubs or gallops, no lower extremity edema.  Respiratory: Bilateral upper lobes diminished.  Scattered wheezing to right upper/middle/lower lobe and left lower lobe.  Rales/rhonchi bilateral lower lobes, right worse than left.  Tachypnea, dyspnea with minimal exertion.  Not using accessory muscles, speaking in full sentences.   Impression and Recommendations:    1. Hospital discharge follow-up Reviewed hospital records and instructions.  2. Pneumonia of right lung due to infectious organism, unspecified part of lung Continue doxycycline twice daily to complete 10-day course.  Continue prednisone taper as prescribed.  3. Smoking Discussed smoking cessation at length.  Reviewed available methods to assist with stopping tobacco use.  Patient aware of health benefits and is very interested in cessation.  Unfortunately several methods have been tried unsuccessfully.  Discussed the possible benefits of incorporating counseling into cessation efforts.  Patient to notify us if counseling is unavailable with the [facility] and we can enter a referral here.  If he decides on a different method to help with smoking cessation, he will let us know.  Return in about 2 weeks (around 03/06/2020) for pneumonia, BP follow up with Dr. Loni Muse or me. ___________________________________________ Clearnce Sorrel, DNP, APRN, FNP-BC [facility] [place]

## 2020-03-06 ENCOUNTER — Ambulatory Visit (INDEPENDENT_AMBULATORY_CARE_PROVIDER_SITE_OTHER): Payer: Medicare Other | Admitting: Medical-Surgical

## 2020-03-06 ENCOUNTER — Encounter: Payer: Self-pay | Admitting: Medical-Surgical

## 2020-03-06 VITALS — BP 138/78 | HR 85 | Wt 214.0 lb

## 2020-03-06 DIAGNOSIS — R030 Elevated blood-pressure reading, without diagnosis of hypertension: Secondary | ICD-10-CM

## 2020-03-06 DIAGNOSIS — F410 Panic disorder [episodic paroxysmal anxiety] without agoraphobia: Secondary | ICD-10-CM

## 2020-03-06 DIAGNOSIS — J189 Pneumonia, unspecified organism: Secondary | ICD-10-CM

## 2020-03-06 DIAGNOSIS — F411 Generalized anxiety disorder: Secondary | ICD-10-CM

## 2020-03-06 NOTE — Progress Notes (Signed)
Subjective:    CC: Pneumonia and blood pressure follow-up  HPI: Pleasant 69 year old male presenting today for follow-up for pneumonia and blood pressure.  Pneumonia-completed his course of doxycycline and prednisone.  Using inhalers as prescribed.  Reports that his symptoms are much improved.  Still has some occasional heavy breathing with exertion but overall feels "better than he has in a long time".  Blood pressure elevated at last visit.  Recheck today 138/78.  Anxiety/depression-taking Lexapro and hydroxyzine as prescribed.  Using hydroxyzine 3 times daily scheduled instead of as needed because he states "he does not want to take the chance".  No more visits to the ED for anxiety since our last visit.  I reviewed the past medical history, family history, social history, surgical history, and allergies today and no changes were needed.  Please see the problem list section below in epic for further details.  Past Medical History: Past Medical History:  Diagnosis Date  . COPD with acute bronchitis ([place]) 11/11/2010   Qualifier: Diagnosis of  By: Joya Gaskins MD, Burnett Harry   . Crohn's ileitis ([facility]) 09/15/2015   Overview:  GI   . Diverticula of colon 08/30/2018   [facility]  . MI (myocardial infarction) ([place])   . Tubular adenoma of colon 08/30/2018   [facility]   Past Surgical History: Past Surgical History:  Procedure Laterality Date  . CERVICAL FUSION    . CORONARY ANGIOPLASTY WITH STENT PLACEMENT     Social History: Social History   Socioeconomic History  . Marital status: Single    Spouse name: Not on file  . Number of children: Not on file  . Years of education: Not on file  . Highest education level: Not on file  Occupational History  . Not on file  Tobacco Use  . Smoking status: Current Every Day Smoker    Packs/day: 1.75    Types: Cigarettes  . Smokeless tobacco: Never Used  Substance  and Sexual Activity  . Alcohol use: Yes  . Drug use: No  . Sexual activity: Not on file  Other Topics Concern  . Not on file  Social History Narrative  . Not on file   Social Determinants of Health   Financial Resource Strain:   . Difficulty of Paying Living Expenses:   Food Insecurity:   . Worried About Charity fundraiser in the Last Year:   . Arboriculturist in the Last Year:   Transportation Needs:   . Film/video editor (Medical):   Marland Kitchen Lack of Transportation (Non-Medical):   Physical Activity:   . Days of Exercise per Week:   . Minutes of Exercise per Session:   Stress:   . Feeling of Stress :   Social Connections:   . Frequency of Communication with Friends and Family:   . Frequency of Social Gatherings with Friends and Family:   . Attends Religious Services:   . Active Member of Clubs or Organizations:   . Attends Archivist Meetings:   Marland Kitchen Marital Status:    Family History: Family History  Problem Relation Age of Onset  . Hypertension Father   . Heart disease Father    Allergies: No Known Allergies Medications: See med rec.  Review of Systems: No fevers, chills, night sweats, weight loss, chest pain, or shortness of breath.   Objective:    General: Well Developed, well nourished, and in no acute distress.  Neuro: Alert and oriented  x3.  HEENT: Normocephalic, atraumatic.  Skin: Warm and dry. Cardiac: Regular rate and rhythm, no murmurs rubs or gallops, no lower extremity edema.  Respiratory: Clear to auscultation bilaterally. Not using accessory muscles, speaking in full sentences.   Impression and Recommendations:    Pneumonia Resolved.  Elevated blood pressure Reading normal but still borderline today.  Continue to recommend low-sodium diet, increasing exercise, and weight loss.  Anxiety/depression Continue Lexapro and hydroxyzine as ordered.  We will follow up on this with Dr. Loni Muse as scheduled.  Return for as scheduled with Dr. Loni Muse.   Next appointment 03/17/2020. ___________________________________________ Clearnce Sorrel, DNP, APRN, FNP-BC [facility] [place]

## 2020-03-17 ENCOUNTER — Encounter: Payer: Self-pay | Admitting: Osteopathic Medicine

## 2020-03-17 ENCOUNTER — Other Ambulatory Visit: Payer: Self-pay

## 2020-03-17 ENCOUNTER — Ambulatory Visit (INDEPENDENT_AMBULATORY_CARE_PROVIDER_SITE_OTHER): Payer: Medicare Other | Admitting: Osteopathic Medicine

## 2020-03-17 VITALS — BP 132/78 | HR 99 | Temp 97.9°F | Wt 212.1 lb

## 2020-03-17 DIAGNOSIS — F410 Panic disorder [episodic paroxysmal anxiety] without agoraphobia: Secondary | ICD-10-CM | POA: Diagnosis not present

## 2020-03-17 DIAGNOSIS — F411 Generalized anxiety disorder: Secondary | ICD-10-CM

## 2020-03-17 MED ORDER — ESCITALOPRAM OXALATE 10 MG PO TABS: 10.0000 mg | ORAL_TABLET | Freq: Every day | ORAL | 3 refills | Status: DC

## 2020-03-17 MED ORDER — OMEPRAZOLE 20 MG PO CPDR: 20.0000 mg | DELAYED_RELEASE_CAPSULE | Freq: Every day | ORAL | 3 refills | Status: DC

## 2020-03-17 MED ORDER — HYDROXYZINE HCL 25 MG PO TABS: 25.0000 mg | ORAL_TABLET | Freq: Three times a day (TID) | ORAL | 1 refills | Status: DC | PRN

## 2020-03-17 NOTE — Progress Notes (Signed)
David Woodard is a 69 y.o. male who presents to  [place] at [facility]  today, 03/17/20, seeking care for the following: . Follow-up anxiety     ASSESSMENT & PLAN with other pertinent history/findings:  The encounter diagnosis was Generalized anxiety disorder with panic attacks.  Previously prescribed hydroxyzine, helpful for anxiety/panic.  At last visit about a month ago, we decided to start daily SSRI with Lexapro as maintenance/prevention, can still use the hydroxyzine up to 3 times daily as needed, advised titrate off of frequent/daily use of this once on the Lexapro and see how he does.  Patient saw a colleague of mine a few weeks ago 03/06/2020 for ER follow-up (I was unavailable for appt), at that time patient was using the hydroxyzine 3 times daily since "he does not want to take the chance" of anxiety getting worse.  Anxiety particularly is exacerbated by COPD flares and shortness of breath symptoms. Today, PHQ9/GAD7 show improvement. He is doing really well, he would prefer to keep taking the hydroxyzine tid. I think this is reasonable, though not my first choice, I think benefit > risk given how good he is feeling. He is also taking the Lexapro 10 mg     There are no Patient Instructions on file for this visit.   No orders of the defined types were placed in this encounter.   Meds ordered this encounter  Medications  . escitalopram (LEXAPRO) 10 MG tablet    Sig: Take 1 tablet (10 mg total) by mouth at bedtime.    Dispense:  90 tablet    Refill:  3  . hydrOXYzine (ATARAX/VISTARIL) 25 MG tablet    Sig: Take 1 tablet (25 mg total) by mouth every 8 (eight) hours as needed for anxiety.    Dispense:  270 tablet    Refill:  1  . omeprazole (PRILOSEC) 20 MG capsule    Sig: Take 1 capsule (20 mg total) by mouth daily.    Dispense:  90 capsule    Refill:  3       Follow-up instructions: Return in about 6 months (around  09/16/2020) for ANNUAL (call week prior to visit for lab orders).  FYI: labs CBC, CMP, Lipid, PSA                                        BP 132/78 (BP Location: Left Arm, Patient Position: Sitting, Cuff Size: Normal)   Pulse 99   Temp 97.9 F (36.6 C) (Oral)   Wt 212 lb 1.3 oz (96.2 kg)   BMI 30.43 kg/m   Current Meds  Medication Sig  . albuterol (PROVENTIL HFA;VENTOLIN HFA) 108 (90 Base) MCG/ACT inhaler Inhale into the lungs every 6 (six) hours as needed.  Marland Kitchen albuterol (PROVENTIL) (2.5 MG/3ML) 0.083% nebulizer solution Take 3 mLs (2.5 mg total) by nebulization every 6 (six) hours as needed for wheezing or shortness of breath.  Marland Kitchen aspirin 81 MG tablet Take 81 mg by mouth daily.  Marland Kitchen escitalopram (LEXAPRO) 10 MG tablet Take 1 tablet (10 mg total) by mouth at bedtime.  . hydrOXYzine (ATARAX/VISTARIL) 25 MG tablet Take 1 tablet (25 mg total) by mouth every 8 (eight) hours as needed for anxiety.  . methylPREDNISolone (MEDROL DOSEPAK) 4 MG TBPK tablet TAKE AS DIRECTED  . promethazine (PHENERGAN) 25 MG tablet Take 1 tablet (25 mg total) by mouth  every 8 (eight) hours as needed for nausea or vomiting.  . rosuvastatin (CRESTOR) 20 MG tablet Take 20 mg by mouth daily.  . Tiotropium Bromide-Olodaterol 2.5-2.5 MCG/ACT AERS Inhale 2 puffs into the lungs daily.  . [DISCONTINUED] escitalopram (LEXAPRO) 10 MG tablet Take 0.5 tablets (5 mg total) by mouth at bedtime for 14 days, THEN 1 tablet (10 mg total) at bedtime.  . [DISCONTINUED] hydrOXYzine (ATARAX/VISTARIL) 25 MG tablet Take 1 tablet (25 mg total) by mouth every 8 (eight) hours as needed for anxiety.    No results found for this or any previous visit (from the past 72 hour(s)).  No results found.  Depression screen [facility] 2/9 03/17/2020 03/06/2020 02/17/2020  Decreased Interest 0 0 0  Down, Depressed, Hopeless 0 0 1  PHQ - 2 Score 0 0 1  Altered sleeping 2 - 3  Tired, decreased energy 0 - 1  Change in appetite 0  - 1  Feeling bad or failure about yourself  0 - 0  Trouble concentrating 0 - 0  Moving slowly or fidgety/restless 0 - 0  Suicidal thoughts 0 - 0  PHQ-9 Score 2 - 6  Difficult doing work/chores Somewhat difficult - -    GAD 7 : Generalized Anxiety Score 03/17/2020 02/17/2020 11/15/2018 06/07/2017  Nervous, Anxious, on Edge 0 1 0 0  Control/stop worrying 1 0 0 1  Worry too much - different things 0 1 0 0  Trouble relaxing 0 1 1 0  Restless 0 1 1 0  Easily annoyed or irritable 0 0 1 1  Afraid - awful might happen 0 0 0 0  Total GAD 7 Score 1 4 3 2   Anxiety Difficulty Somewhat difficult - Somewhat difficult -      All questions at time of visit were answered - patient instructed to contact office with any additional concerns or updates.  ER/RTC precautions were reviewed with the patient.  Please note: voice recognition software was used to produce this document, and typos may escape review. Please contact Dr. Sheppard Coil for any needed clarifications.

## 2020-05-19 ENCOUNTER — Ambulatory Visit (INDEPENDENT_AMBULATORY_CARE_PROVIDER_SITE_OTHER): Payer: Medicare Other | Admitting: Osteopathic Medicine

## 2020-05-19 ENCOUNTER — Encounter: Payer: Self-pay | Admitting: Osteopathic Medicine

## 2020-05-19 VITALS — BP 143/78 | HR 80 | Temp 98.0°F | Wt 204.0 lb

## 2020-05-19 DIAGNOSIS — K0510 Chronic gingivitis, plaque induced: Secondary | ICD-10-CM

## 2020-05-19 DIAGNOSIS — K029 Dental caries, unspecified: Secondary | ICD-10-CM

## 2020-05-19 MED ORDER — LIDOCAINE VISCOUS HCL 2 % MT SOLN: 15.0000 mL | OROMUCOSAL | 1 refills | Status: DC | PRN

## 2020-05-19 MED ORDER — AMOXICILLIN 500 MG PO CAPS: 500.0000 mg | ORAL_CAPSULE | Freq: Two times a day (BID) | ORAL | 0 refills | Status: AC

## 2020-05-19 NOTE — Patient Instructions (Addendum)
We need a dentist!  Call the [facility] Let us know if referral needed

## 2020-05-19 NOTE — Progress Notes (Signed)
David Woodard is a 69 y.o. male who presents to  [place] at [facility]  today, 05/19/20, seeking care for the following: Marland Kitchen Mouth sore on bottom of mouth at R lateral gum, hurts when he tries to eat anything. Ongoing for about 4 days. On exam, significant dental caries and gingivitis, no abscess noted      ASSESSMENT & PLAN with other pertinent history/findings:  The primary encounter diagnosis was Gingivitis. A diagnosis of Dental caries was also pertinent to this visit.    Patient Instructions  We need a dentist!  Call the [place] Let us know if referral needed      No orders of the defined types were placed in this encounter.   Meds ordered this encounter  Medications  . amoxicillin (AMOXIL) 500 MG capsule    Sig: Take 1 capsule (500 mg total) by mouth 2 (two) times daily for 10 days.    Dispense:  20 capsule    Refill:  0  . lidocaine (XYLOCAINE) 2 % solution    Sig: Use as directed 15 mLs in the mouth or throat every 3 (three) hours as needed for mouth pain.    Dispense:  100 mL    Refill:  1       Follow-up instructions: Return if symptoms worsen or fail to improve.                                         BP (!) 143/78 (BP Location: Left Arm, Patient Position: Sitting, Cuff Size: Normal)   Pulse 80   Temp 98 F (36.7 C) (Oral)   Wt 204 lb (92.5 kg)   BMI 29.27 kg/m   Current Meds  Medication Sig  . albuterol (PROVENTIL HFA;VENTOLIN HFA) 108 (90 Base) MCG/ACT inhaler Inhale into the lungs every 6 (six) hours as needed.  Marland Kitchen albuterol (PROVENTIL) (2.5 MG/3ML) 0.083% nebulizer solution Take 3 mLs (2.5 mg total) by nebulization every 6 (six) hours as needed for wheezing or shortness of breath.  Marland Kitchen aspirin 81 MG tablet Take 81 mg by mouth daily.  Marland Kitchen escitalopram (LEXAPRO) 10 MG tablet Take 1 tablet (10 mg total) by mouth at bedtime.  . hydrOXYzine (ATARAX/VISTARIL) 25 MG tablet Take 1  tablet (25 mg total) by mouth every 8 (eight) hours as needed for anxiety.  Marland Kitchen omeprazole (PRILOSEC) 20 MG capsule Take 1 capsule (20 mg total) by mouth daily.  . promethazine (PHENERGAN) 25 MG tablet Take 1 tablet (25 mg total) by mouth every 8 (eight) hours as needed for nausea or vomiting.  . rosuvastatin (CRESTOR) 20 MG tablet Take 20 mg by mouth daily.  . Tiotropium Bromide-Olodaterol 2.5-2.5 MCG/ACT AERS Inhale 2 puffs into the lungs daily.    No results found for this or any previous visit (from the past 72 hour(s)).  No results found.  Depression screen [facility] 2/9 05/19/2020 03/17/2020 03/06/2020  Decreased Interest 0 0 0  Down, Depressed, Hopeless 0 0 0  PHQ - 2 Score 0 0 0  Altered sleeping 2 2 -  Tired, decreased energy 1 0 -  Change in appetite 1 0 -  Feeling bad or failure about yourself  0 0 -  Trouble concentrating 0 0 -  Moving slowly or fidgety/restless 0 0 -  Suicidal thoughts 0 0 -  PHQ-9 Score 4 2 -  Difficult doing work/chores -  Somewhat difficult -    GAD 7 : Generalized Anxiety Score 05/19/2020 03/17/2020 02/17/2020 11/15/2018  Nervous, Anxious, on Edge 0 0 1 0  Control/stop worrying 0 1 0 0  Worry too much - different things 0 0 1 0  Trouble relaxing 0 0 1 1  Restless 0 0 1 1  Easily annoyed or irritable 1 0 0 1  Afraid - awful might happen 0 0 0 0  Total GAD 7 Score 1 1 4 3   Anxiety Difficulty - Somewhat difficult - Somewhat difficult      All questions at time of visit were answered - patient instructed to contact office with any additional concerns or updates.  ER/RTC precautions were reviewed with the patient.  Please note: voice recognition software was used to produce this document, and typos may escape review. Please contact Dr. Sheppard Coil for any needed clarifications.

## 2020-06-24 ENCOUNTER — Other Ambulatory Visit: Payer: Self-pay

## 2020-06-24 MED ORDER — PROMETHAZINE HCL 25 MG PO TABS: 25.0000 mg | ORAL_TABLET | Freq: Three times a day (TID) | ORAL | 3 refills | Status: DC | PRN

## 2020-07-15 IMAGING — DX DG CHEST 2V
2 series · 2 of 2 positions shown · non-contrast
Comparison: April 30, 2018

CLINICAL DATA: Cough and congestion.  Shortness of breath.

EXAM:
CHEST - 2 VIEW

[chest pa]
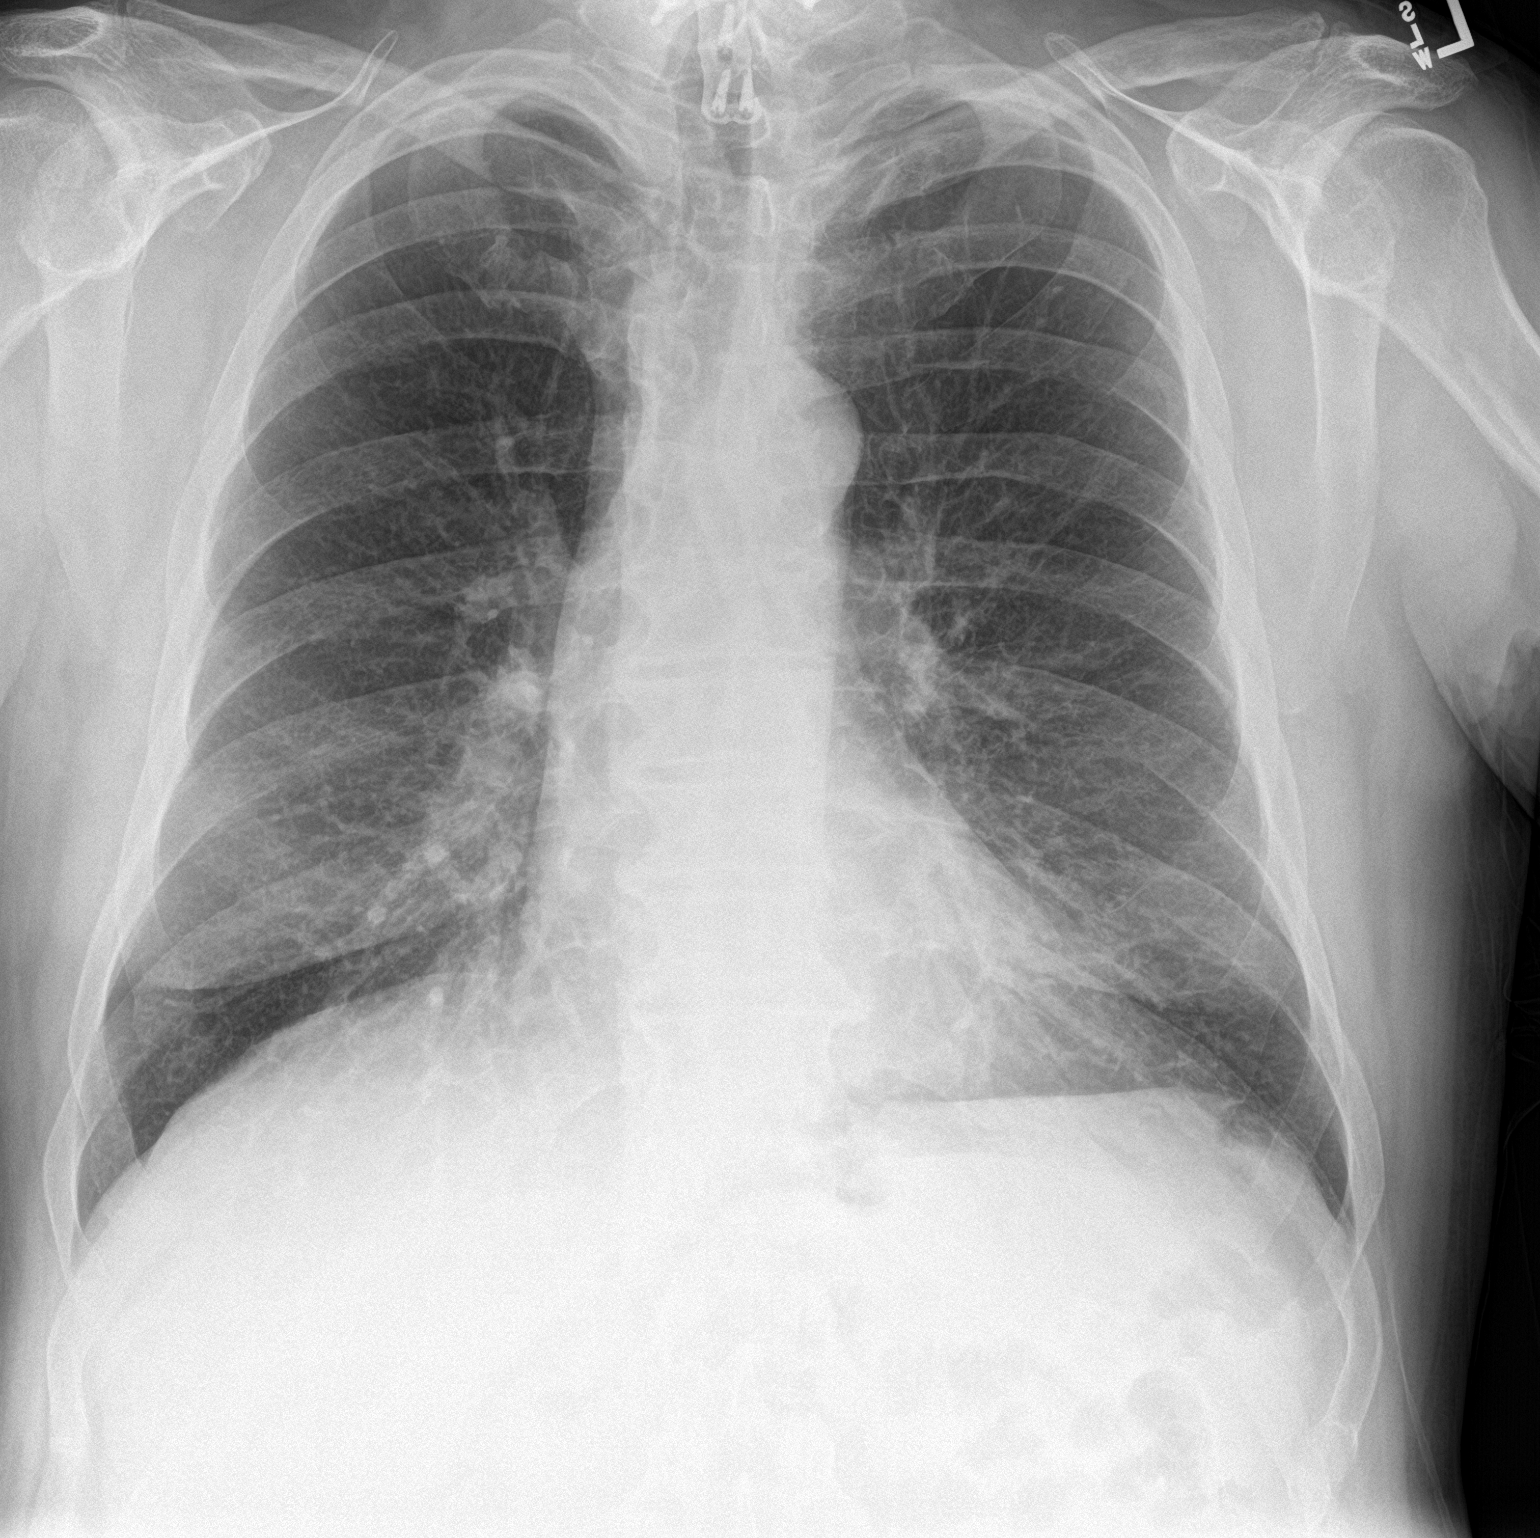

[chest lat]
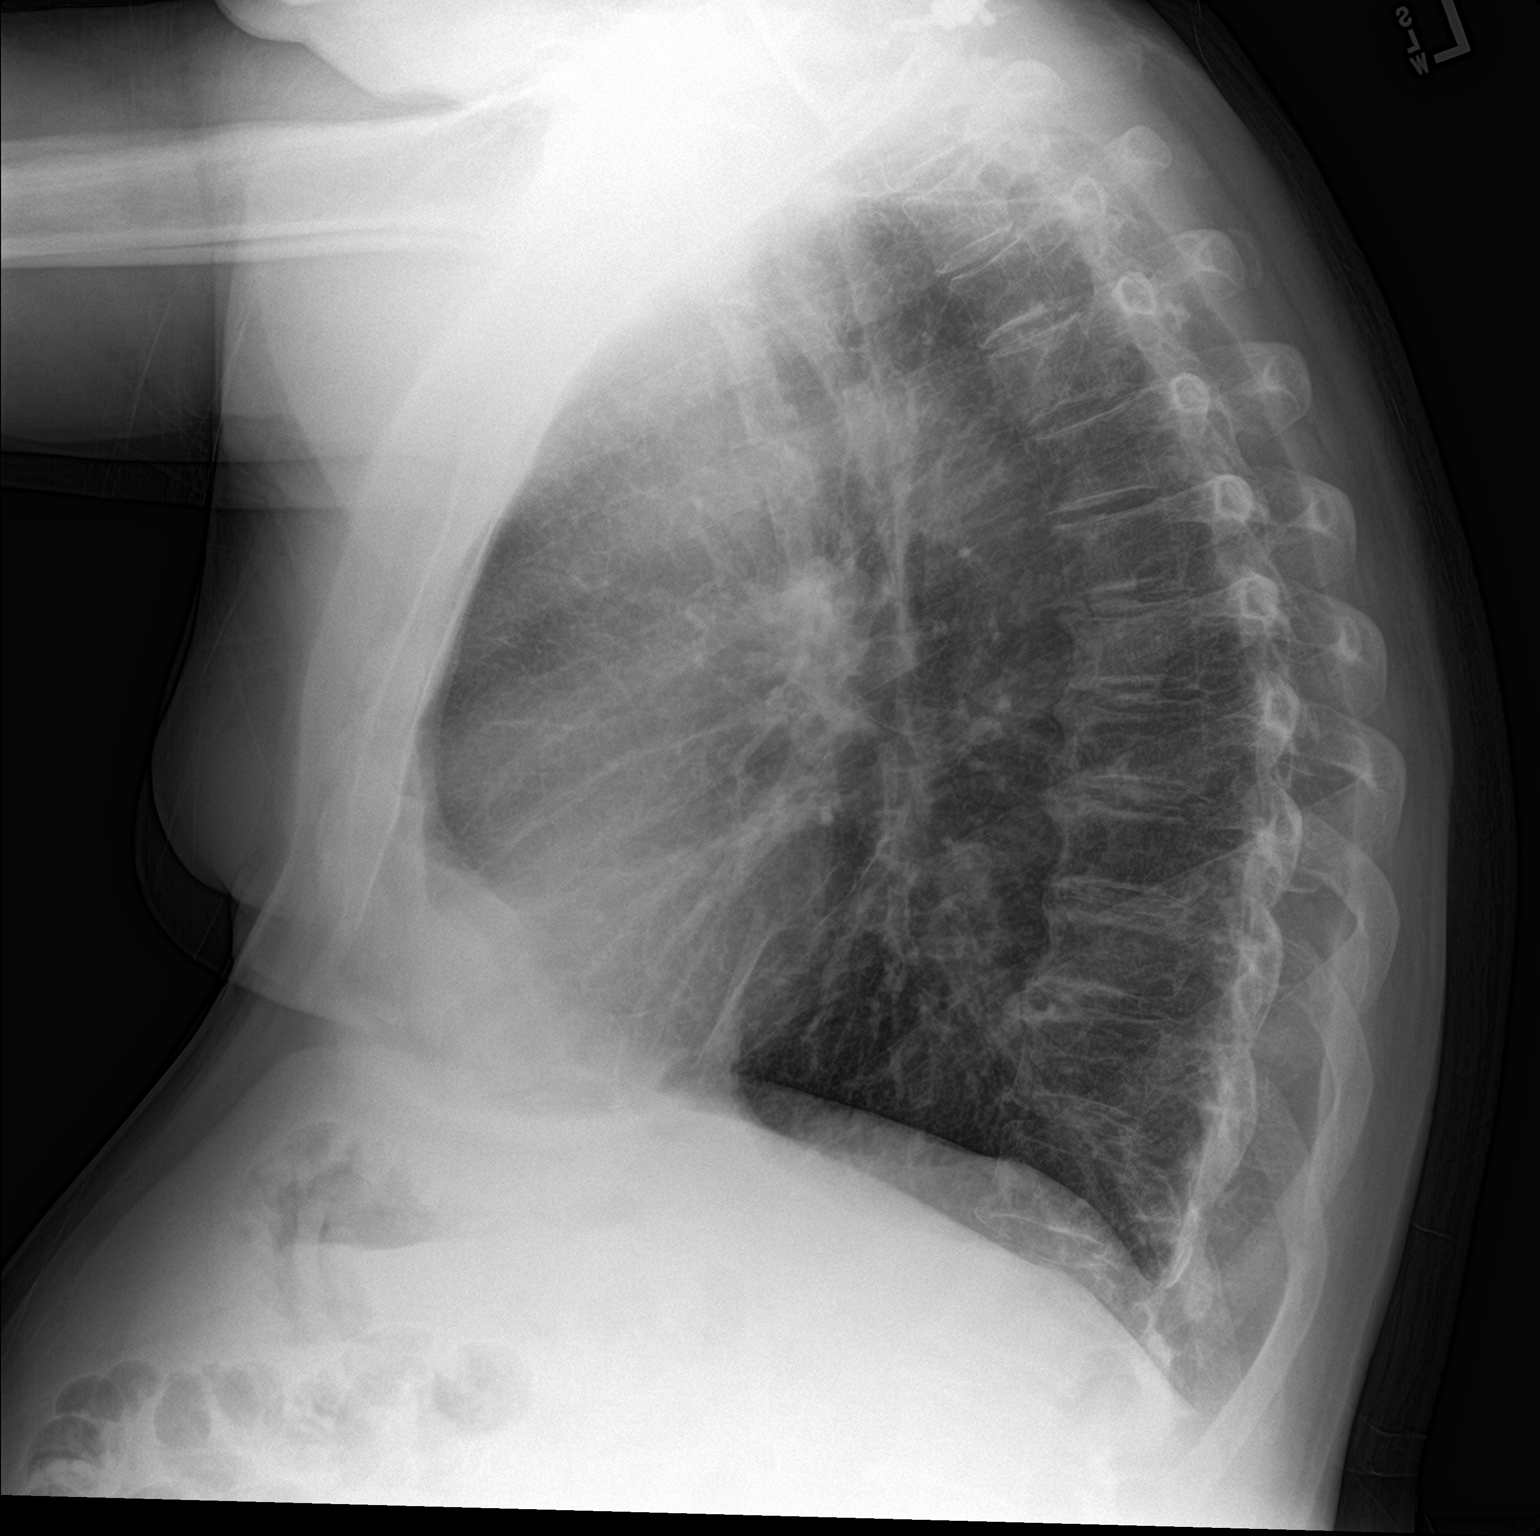

[2 of 2 positions shown; findings below may reference images not displayed]

FINDINGS: The heart, hila, mediastinum, and pleura are normal. Mild
atelectasis in the bases. No focal infiltrate.
IMPRESSION: No active cardiopulmonary disease.

## 2020-08-19 ENCOUNTER — Encounter: Payer: Self-pay | Admitting: Osteopathic Medicine

## 2020-08-19 ENCOUNTER — Other Ambulatory Visit: Payer: Self-pay

## 2020-08-19 ENCOUNTER — Ambulatory Visit (INDEPENDENT_AMBULATORY_CARE_PROVIDER_SITE_OTHER): Payer: Medicare Other | Admitting: Osteopathic Medicine

## 2020-08-19 VITALS — BP 143/87 | HR 107 | Wt 190.0 lb

## 2020-08-19 DIAGNOSIS — E785 Hyperlipidemia, unspecified: Secondary | ICD-10-CM | POA: Diagnosis not present

## 2020-08-19 DIAGNOSIS — I2510 Atherosclerotic heart disease of native coronary artery without angina pectoris: Secondary | ICD-10-CM | POA: Diagnosis not present

## 2020-08-19 DIAGNOSIS — R0789 Other chest pain: Secondary | ICD-10-CM | POA: Diagnosis not present

## 2020-08-19 DIAGNOSIS — R030 Elevated blood-pressure reading, without diagnosis of hypertension: Secondary | ICD-10-CM

## 2020-08-19 DIAGNOSIS — J449 Chronic obstructive pulmonary disease, unspecified: Secondary | ICD-10-CM | POA: Diagnosis not present

## 2020-08-19 DIAGNOSIS — I252 Old myocardial infarction: Secondary | ICD-10-CM

## 2020-08-19 MED ORDER — PROMETHAZINE HCL 25 MG PO TABS
25.0000 mg | ORAL_TABLET | Freq: Three times a day (TID) | ORAL | 3 refills | Status: DC | PRN
Start: 2020-08-19 — End: 2021-02-17

## 2020-08-19 MED ORDER — VARENICLINE TARTRATE 1 MG PO TABS: 1.0000 mg | ORAL_TABLET | Freq: Two times a day (BID) | ORAL | 3 refills | Status: DC

## 2020-08-19 MED ORDER — CHANTIX STARTING MONTH PAK 0.5 MG X 11 & 1 MG X 42 PO TABS: ORAL_TABLET | ORAL | 0 refills | Status: DC

## 2020-08-19 NOTE — Patient Instructions (Addendum)
Will work on cutting back and eventually quitting smoking I printed Rx for Chantix  Please keep follow-up with cardiology

## 2020-08-19 NOTE — Progress Notes (Signed)
David Woodard is a 69 y.o. male who presents to  [place] at [facility]  today, 08/19/20, seeking care for the following:  . ER follow-up, chest pain / SOB o ER records reviewed 08/14/20, CC L chest pain, intermittent, ongoing 1+ week. Pain worse w/ coughing. No exertional symptoms. BP elevated (171/93). Known COPD, CAD (Hx MI 1997, PCI 2003). W/u negative ACS. Dx chest wall pain and d/c home w/ ibuprofen. o Last visit w/ cardiology, Dr Mauricio Po, 06/04/19. Stable at that point. Lexiscan stress test ordered. LDL goal <70 started Crestor 20 mg, tobacco cessation - I cannot locate records of stress test results o Current relevant Rx - CAD: ASA 81 daily, rosuvastatin 20 mg daily, no BB, no ACE/ARB (BP has been fine in the past). I don't see nitro - COPD: albuterol prn, Trelegy daily, steroid Rx prn  - Anxiety: Lexapro 10 mg daily, Hydroxyzine 25 mg prn anxiety      ASSESSMENT & PLAN with other pertinent findings:  The primary encounter diagnosis was Other chest pain. Diagnoses of COPD (chronic obstructive pulmonary disease) with chronic bronchitis ([place]), Coronary artery disease with history of myocardial infarction without history of CABG, Dyslipidemia, and Elevated blood pressure reading without diagnosis of hypertension were also pertinent to this visit.  1. Other chest pain Nonrepdorucible, nonexertional, but concerning CAD/COPD history Normal cardiac exam today, no LE edema/JVD  Consider adding nitro - defer to cardiology, pt overdue for Lexiscan scheduled last year but not done (pt had something come up)   2. COPD (chronic obstructive pulmonary disease) with chronic bronchitis ([facility]) Stable on current Rx On exam, dinimished breath sounds bilaterally no wheeze/rohchi  We discussed quitting smoking - quit date advised, or date by which he'd like to be at 1 pack, half pack, etc. He's made improvements in cuting back but has found quitting  altogether to be very difficult   3. Coronary artery disease with history of myocardial infarction without history of CABG Following w/ cardiology this week. May consider addition of BB/ACE/ARB for BP  4. Dyslipidemia Continue statin   5. Elevated blood pressure reading without diagnosis of hypertension See #3      BP Readings from Last 3 Encounters:  08/19/20 (!) 143/87  05/19/20 (!) 143/78  03/17/20 132/78      Patient Instructions  Will work on cutting back and eventually quitting smoking I printed Rx for Chantix  Please keep follow-up with cardiology      No orders of the defined types were placed in this encounter.   Meds ordered this encounter  Medications  . varenicline (CHANTIX STARTING MONTH PAK) 0.5 MG X 11 & 1 MG X 42 tablet    Sig: Take one 0.5 mg tablet by mouth once daily for 3 days, then increase to one 0.5 mg tablet twice daily for 4 days, then increase to one 1 mg tablet twice daily.    Dispense:  42 tablet    Refill:  0  . varenicline (CHANTIX CONTINUING MONTH PAK) 1 MG tablet    Sig: Take 1 tablet (1 mg total) by mouth 2 (two) times daily.    Dispense:  180 tablet    Refill:  3  . promethazine (PHENERGAN) 25 MG tablet    Sig: Take 1 tablet (25 mg total) by mouth every 8 (eight) hours as needed for nausea or vomiting.    Dispense:  30 tablet    Refill:  3  Follow-up instructions: Return if symptoms worsen or fail to improve.                                         BP (!) 143/87 (BP Location: Left Arm, Patient Position: Sitting)   Pulse (!) 107   Wt 190 lb (86.2 kg)   SpO2 99%   BMI 27.26 kg/m   Current Meds  Medication Sig  . albuterol (PROVENTIL HFA;VENTOLIN HFA) 108 (90 Base) MCG/ACT inhaler Inhale into the lungs every 6 (six) hours as needed.  Marland Kitchen albuterol (PROVENTIL) (2.5 MG/3ML) 0.083% nebulizer solution Take 3 mLs (2.5 mg total) by nebulization every 6 (six) hours as needed for wheezing  or shortness of breath.  Marland Kitchen aspirin 81 MG tablet Take 81 mg by mouth daily.  Marland Kitchen escitalopram (LEXAPRO) 10 MG tablet Take 1 tablet (10 mg total) by mouth at bedtime.  . hydrOXYzine (ATARAX/VISTARIL) 25 MG tablet Take 1 tablet (25 mg total) by mouth every 8 (eight) hours as needed for anxiety.  . methylPREDNISolone (MEDROL DOSEPAK) 4 MG TBPK tablet TAKE AS DIRECTED  . promethazine (PHENERGAN) 25 MG tablet Take 1 tablet (25 mg total) by mouth every 8 (eight) hours as needed for nausea or vomiting.  . rosuvastatin (CRESTOR) 20 MG tablet Take 20 mg by mouth daily.  . Tiotropium Bromide-Olodaterol 2.5-2.5 MCG/ACT AERS Inhale 2 puffs into the lungs daily.    No results found for this or any previous visit (from the past 72 hour(s)).  No results found.     All questions at time of visit were answered - patient instructed to contact office with any additional concerns or updates.  ER/RTC precautions were reviewed with the patient as applicable.   Please note: voice recognition software was used to produce this document, and typos may escape review. Please contact Dr. Sheppard Coil for any needed clarifications.

## 2020-09-11 ENCOUNTER — Ambulatory Visit (INDEPENDENT_AMBULATORY_CARE_PROVIDER_SITE_OTHER): Payer: Medicare Other | Admitting: Nurse Practitioner

## 2020-09-11 ENCOUNTER — Encounter: Payer: Self-pay | Admitting: Nurse Practitioner

## 2020-09-11 ENCOUNTER — Other Ambulatory Visit: Payer: Self-pay

## 2020-09-11 VITALS — BP 133/69 | HR 90 | Wt 185.0 lb

## 2020-09-11 DIAGNOSIS — S0101XA Laceration without foreign body of scalp, initial encounter: Secondary | ICD-10-CM | POA: Insufficient documentation

## 2020-09-11 DIAGNOSIS — B372 Candidiasis of skin and nail: Secondary | ICD-10-CM

## 2020-09-11 DIAGNOSIS — S0101XD Laceration without foreign body of scalp, subsequent encounter: Secondary | ICD-10-CM | POA: Diagnosis not present

## 2020-09-11 MED ORDER — FLUCONAZOLE 200 MG PO TABS: ORAL_TABLET | ORAL | 0 refills | Status: DC

## 2020-09-11 MED ORDER — KETOCONAZOLE 2 % EX CREA: 1.0000 "application " | TOPICAL_CREAM | Freq: Two times a day (BID) | CUTANEOUS | 3 refills | Status: DC

## 2020-09-11 MED ORDER — HYDROCORTISONE 2.5 % EX CREA: TOPICAL_CREAM | Freq: Two times a day (BID) | CUTANEOUS | 3 refills | Status: DC

## 2020-09-11 NOTE — Assessment & Plan Note (Signed)
Well healing laceration to the scalp with no signs of infection.  Recommend avoiding picking or pulling at the scab and notify the office of any signs of infection or bleeding.  Follow-up with PCP at hospital follow-up scheduled.

## 2020-09-11 NOTE — Progress Notes (Signed)
Acute Office Visit  Subjective:    Patient ID: David Woodard, male    DOB: Dec 20, 1950, 69 y.o.   MRN: [id number]  Chief Complaint  Patient presents with  . Rash    Groin    HPI Sandip Power is a pleasant 69 year old male presenting today for evaluation of a rash to his groin and gluteal folds.   He reports that he was recently hospitalized for an extended period due to falling and hitting his head and being found down while apparently going through an episode of acute alcohol withdrawal. He has very limited memory of his arrival to the hospital or the hospital stay itself.   Chart review reveals that he was seen in the emergency room for DT's and left AMA. Subsequently, he was found down with a head injury by law enforcement and his family transported him back to the hospital. During his stay he was treated for AKI, acute c. Diff infection, DT's, and pancreatitis. He currently still has a foley catheter in place to be removed by urology for acute urinary retention and AKI.   He reports after leaving the hospital he had a small, red rash to his groin. He tells me that he was putting gold bond powder on the rash, but it continued to spread down his legs and posteriorly to his buttocks. He tells me his sister, who is a Marine scientist, told him to purchase antifungal powder and apply that. He has been using that for a couple of days, but the rash is not improving.   He states the rash is itching and burning and feels raw. He denies drainage, bleeding, or odor from the area.   He does tell me that he has not had any alcohol since his discharge from the hospital and does not plan to drink. He is still smoking, but down to 1/2 PPD of cigarettes.    Past Medical History:  Diagnosis Date  . COPD with acute bronchitis ([place]) 11/11/2010   Qualifier: Diagnosis of  By: Joya Gaskins MD, Burnett Harry   . Crohn's ileitis ([place]) 09/15/2015   Overview:  GI   . Diverticula of colon 08/30/2018   [facility]  . MI (myocardial infarction) ([place])   . Tubular adenoma of colon 08/30/2018   [facility]    Past Surgical History:  Procedure Laterality Date  . CERVICAL FUSION    . CORONARY ANGIOPLASTY WITH STENT PLACEMENT      Family History  Problem Relation Age of Onset  . Hypertension Father   . Heart disease Father     Social History   Socioeconomic History  . Marital status: Single    Spouse name: Not on file  . Number of children: Not on file  . Years of education: Not on file  . Highest education level: Not on file  Occupational History  . Not on file  Tobacco Use  . Smoking status: Current Every Day Smoker    Packs/day: 1.75    Types: Cigarettes  . Smokeless tobacco: Never Used  Substance and Sexual Activity  . Alcohol use: Yes  . Drug use: No  . Sexual activity: Not on file  Other Topics Concern  . Not on file  Social History Narrative  . Not on file   Social Determinants of Health   Financial Resource Strain:   . Difficulty of Paying Living Expenses: Not on file  Food Insecurity:   . Worried About Running  Out of Food in the Last Year: Not on file  . Ran Out of Food in the Last Year: Not on file  Transportation Needs:   . Lack of Transportation (Medical): Not on file  . Lack of Transportation (Non-Medical): Not on file  Physical Activity:   . Days of Exercise per Week: Not on file  . Minutes of Exercise per Session: Not on file  Stress:   . Feeling of Stress : Not on file  Social Connections:   . Frequency of Communication with Friends and Family: Not on file  . Frequency of Social Gatherings with Friends and Family: Not on file  . Attends Religious Services: Not on file  . Active Member of Clubs or Organizations: Not on file  . Attends Archivist Meetings: Not on file  . Marital Status: Not on file  Intimate Partner Violence:   . Fear of Current or Ex-Partner: Not on file  .  Emotionally Abused: Not on file  . Physically Abused: Not on file  . Sexually Abused: Not on file    Outpatient Medications Prior to Visit  Medication Sig Dispense Refill  . folic acid (FOLVITE) 1 MG tablet Take by mouth.    . metoprolol succinate (TOPROL-XL) 25 MG 24 hr tablet Take by mouth.    . nystatin (MYCOSTATIN) 100000 UNIT/ML suspension Take by mouth.    . thiamine 100 MG tablet Take by mouth.    . vancomycin (VANCOCIN) 125 MG capsule Take by mouth.    Marland Kitchen albuterol (PROVENTIL HFA;VENTOLIN HFA) 108 (90 Base) MCG/ACT inhaler Inhale into the lungs every 6 (six) hours as needed.    Marland Kitchen albuterol (PROVENTIL) (2.5 MG/3ML) 0.083% nebulizer solution Take 3 mLs (2.5 mg total) by nebulization every 6 (six) hours as needed for wheezing or shortness of breath. 150 mL 1  . aspirin 81 MG tablet Take 81 mg by mouth daily.    Marland Kitchen escitalopram (LEXAPRO) 10 MG tablet Take 1 tablet (10 mg total) by mouth at bedtime. 90 tablet 3  . methylPREDNISolone (MEDROL DOSEPAK) 4 MG TBPK tablet TAKE AS DIRECTED    . promethazine (PHENERGAN) 25 MG tablet Take 1 tablet (25 mg total) by mouth every 8 (eight) hours as needed for nausea or vomiting. 30 tablet 3  . Tiotropium Bromide-Olodaterol 2.5-2.5 MCG/ACT AERS Inhale 2 puffs into the lungs daily.    . hydrOXYzine (ATARAX/VISTARIL) 25 MG tablet Take 1 tablet (25 mg total) by mouth every 8 (eight) hours as needed for anxiety. 270 tablet 1  . rosuvastatin (CRESTOR) 20 MG tablet Take 20 mg by mouth daily.    . varenicline (CHANTIX CONTINUING MONTH PAK) 1 MG tablet Take 1 tablet (1 mg total) by mouth 2 (two) times daily. 180 tablet 3  . varenicline (CHANTIX STARTING MONTH PAK) 0.5 MG X 11 & 1 MG X 42 tablet Take one 0.5 mg tablet by mouth once daily for 3 days, then increase to one 0.5 mg tablet twice daily for 4 days, then increase to one 1 mg tablet twice daily. 42 tablet 0   No facility-administered medications prior to visit.    No Known Allergies  Review of  Systems See HPI    Objective:    Physical Exam Vitals and nursing note reviewed.  Constitutional:      Appearance: Normal appearance.  HENT:     Head:   Eyes:     Extraocular Movements: Extraocular movements intact.     Conjunctiva/sclera: Conjunctivae normal.  Pupils: Pupils are equal, round, and reactive to light.  Cardiovascular:     Rate and Rhythm: Normal rate and regular rhythm.     Pulses: Normal pulses.     Heart sounds: Normal heart sounds.  Pulmonary:     Effort: Pulmonary effort is normal.     Breath sounds: Normal breath sounds.  Musculoskeletal:        General: Normal range of motion.     Right lower leg: No edema.     Left lower leg: No edema.  Skin:    General: Skin is warm and dry.     Capillary Refill: Capillary refill takes less than 2 seconds.     Findings: Rash present.          Comments: Foley catheter in place ,appears to be draining well. Urine clear yellow.   Neurological:     General: No focal deficit present.     Mental Status: He is alert and oriented to person, place, and time.  Psychiatric:        Mood and Affect: Mood normal.        Behavior: Behavior normal.        Thought Content: Thought content normal.        Judgment: Judgment normal.     BP 133/69   Pulse 90   Wt 185 lb (83.9 kg)   SpO2 98%   BMI 26.54 kg/m  Wt Readings from Last 3 Encounters:  09/11/20 185 lb (83.9 kg)  08/19/20 190 lb (86.2 kg)  05/19/20 204 lb (92.5 kg)    Health Maintenance Due  Topic Date Due  . Hepatitis C Screening  Never done  . TETANUS/TDAP  Never done  . PNA vac Low Risk Adult (2 of 2 - PPSV23) 10/18/2019  . INFLUENZA VACCINE  Never done    There are no preventive care reminders to display for this patient.   Lab Results  Component Value Date   TSH 2.62 05/16/2019   Lab Results  Component Value Date   WBC 10.2 09/09/2019   HGB 15.6 09/09/2019   HCT 47.1 09/09/2019   MCV 94.8 09/09/2019   PLT 316 09/09/2019   Lab Results   Component Value Date   NA 135 09/09/2019   K 4.7 09/09/2019   CO2 27 09/09/2019   GLUCOSE 99 09/09/2019   BUN 14 09/09/2019   CREATININE 1.18 09/09/2019   BILITOT 0.5 09/09/2019   ALKPHOS 61 05/21/2017   AST 14 09/09/2019   ALT 15 09/09/2019   PROT 6.3 09/09/2019   CALCIUM 8.9 09/09/2019   No results found for: CHOL No results found for: HDL No results found for: LDLCALC No results found for: TRIG No results found for: CHOLHDL No results found for: HGBA1C     Assessment & Plan:   Problem List Items Addressed This Visit      Musculoskeletal and Integument   Intertrigo of genitocrural region due to Candida species - Primary    Symptoms and presentation consistent with intertrigo due to candida to the genital and gluteal area.  Written and verbal instructions provided to patient with teach back to ensure understanding.  Recommend washing groin and buttocks with scent free baby shampoo twice a day with finger tips and patting area dry. Recommend use of COOL setting on hair dryer to the groin to help ensure no moisture remains.  Instructions to apply hydrocortisone and ketoconazole cream mixture to the areas of redness twice a day after cleansing and drying  to cover the areas completely. Then apply a thin layer of antifungal powder to the area. Continue this until rash is completely gone plus two days.  Recommend wearing loose fitting cotton clothing to allow for airflow.  Recommend use of a soft towel or pillow case between the legs to prevent friction and absorb excess moisture.  Instructions to take oral fluconazole every 72 hours for 3 doses then every 7 days for 3 doses. Dates provided for patient on paperwork and prescription bottle.  Discussed with patient factors that would warrant re-evaluation. Keep hospital follow-up appt with PCP.  Re-evaluate at hospital follow-up appointment or sooner if needed.        Relevant Medications   nystatin (MYCOSTATIN) 100000 UNIT/ML  suspension   vancomycin (VANCOCIN) 125 MG capsule   fluconazole (DIFLUCAN) 200 MG tablet   ketoconazole (NIZORAL) 2 % cream   hydrocortisone 2.5 % cream     Other   Laceration of scalp without foreign body    Well healing laceration to the scalp with no signs of infection.  Recommend avoiding picking or pulling at the scab and notify the office of any signs of infection or bleeding.  Follow-up with PCP at hospital follow-up scheduled.           Meds ordered this encounter  Medications  . fluconazole (DIFLUCAN) 200 MG tablet    Sig: Take one tablet on the following days: 09/11/2020, 09/14/2020, 09/17/2020, 09/24/2020, 10/01/2020, 10/08/2020.    Dispense:  6 tablet    Refill:  0  . ketoconazole (NIZORAL) 2 % cream    Sig: Apply 1 application topically 2 (two) times daily. To affected areas    Dispense:  60 g    Refill:  3  . hydrocortisone 2.5 % cream    Sig: Apply topically 2 (two) times daily.    Dispense:  30 g    Refill:  3     Orma Render, NP

## 2020-09-11 NOTE — Assessment & Plan Note (Signed)
Symptoms and presentation consistent with intertrigo due to candida to the genital and gluteal area.  Written and verbal instructions provided to patient with teach back to ensure understanding.  Recommend washing groin and buttocks with scent free baby shampoo twice a day with finger tips and patting area dry. Recommend use of COOL setting on hair dryer to the groin to help ensure no moisture remains.  Instructions to apply hydrocortisone and ketoconazole cream mixture to the areas of redness twice a day after cleansing and drying to cover the areas completely. Then apply a thin layer of antifungal powder to the area. Continue this until rash is completely gone plus two days.  Recommend wearing loose fitting cotton clothing to allow for airflow.  Recommend use of a soft towel or pillow case between the legs to prevent friction and absorb excess moisture.  Instructions to take oral fluconazole every 72 hours for 3 doses then every 7 days for 3 doses. Dates provided for patient on paperwork and prescription bottle.  Discussed with patient factors that would warrant re-evaluation. Keep hospital follow-up appt with PCP.  Re-evaluate at hospital follow-up appointment or sooner if needed.

## 2020-09-11 NOTE — Patient Instructions (Signed)
Wash the area two times a day and pat dry with a clean towel. You can also use a hair dryer on the cool setting to get the area good and dry.   Mix the ketoconazole cream (about 1 1/2 inch long) and the hydrocortisone cream (about 1 1/2 inch long) together and apply the mixture in a thin layer over the entire area. You can then sprinkle the antifungal powder over the entire area.  Do this until the rash is completely gone and 2 more days after the rash is gone.    Do not wear tight pants- wear loose shorts and keep the area cool and dry. You can put a soft towel or pillow case in between your legs at night to help keep from rubbing the area together and making the rash worse.   Take one diflucan pill today, Friday 09/11/2020 Take the next diflucan pill on Monday  09/14/2020 Take the next diflucan pill on Thursday 09/17/2020 Then take one pill every Thursday for 3 more weeks- 09/24/2020, 10/01/2020, and 10/08/2020.   Skin Yeast Infection  A skin yeast infection is a condition in which there is an overgrowth of yeast (candida) that normally lives on the skin. This condition usually occurs in areas of the skin that are constantly warm and moist, such as the armpits or the groin. What are the causes? This condition is caused by a change in the normal balance of the yeast and bacteria that live on the skin. What increases the risk? You are more likely to develop this condition if you:  Are obese.  Are pregnant.  Take birth control pills.  Have diabetes.  Take antibiotic medicines.  Take steroid medicines.  Are malnourished.  Have a weak body defense system (immune system).  Are 41 years of age or older.  Wear tight clothing. What are the signs or symptoms? The most common symptom of this condition is itchiness in the affected area. Other symptoms include:  Red, swollen area of the skin.  Bumps on the skin. How is this diagnosed?  This condition is diagnosed with a medical  history and physical exam.  Your health care provider may check for yeast by taking light scrapings of the skin to be viewed under a microscope. How is this treated? This condition is treated with medicine. Medicines may be prescribed or be available over the counter. The medicines may be:  Taken by mouth (orally).  Applied as a cream or powder to your skin. Follow these instructions at home:   Take or apply over-the-counter and prescription medicines only as told by your health care provider.  Maintain a healthy weight. If you need help losing weight, talk with your health care provider.  Keep your skin clean and dry.  If you have diabetes, keep your blood sugar under control.  Keep all follow-up visits as told by your health care provider. This is important. Contact a health care provider if:  Your symptoms go away and then return.  Your symptoms do not get better with treatment.  Your symptoms get worse.  Your rash spreads.  You have a fever or chills.  You have new symptoms.  You have new warmth or redness of your skin. Summary  A skin yeast infection is a condition in which there is an overgrowth of yeast (candida) that normally lives on the skin. This condition is caused by a change in the normal balance of the yeast and bacteria that live on the skin.  Take  or apply over-the-counter and prescription medicines only as told by your health care provider.  Keep your skin clean and dry.  Contact a health care provider if your symptoms do not get better with treatment. This information is not intended to replace advice given to you by your health care provider. Make sure you discuss any questions you have with your health care provider. Document Revised: 04/03/2018 Document Reviewed: 04/03/2018 Elsevier Patient Education  [employer].

## 2020-09-11 NOTE — Addendum Note (Signed)
Addended by: Oaklyn Jakubek, Clarise Cruz E on: 09/11/2020 04:34 PM   Modules accepted: Orders

## 2020-09-14 ENCOUNTER — Inpatient Hospital Stay: Payer: Medicare Other | Admitting: Osteopathic Medicine

## 2020-09-16 ENCOUNTER — Ambulatory Visit: Payer: Medicare Other | Admitting: Osteopathic Medicine

## 2020-09-17 ENCOUNTER — Other Ambulatory Visit: Payer: Self-pay

## 2020-09-17 ENCOUNTER — Ambulatory Visit (INDEPENDENT_AMBULATORY_CARE_PROVIDER_SITE_OTHER): Payer: Medicare Other

## 2020-09-17 ENCOUNTER — Encounter: Payer: Self-pay | Admitting: Osteopathic Medicine

## 2020-09-17 ENCOUNTER — Ambulatory Visit (INDEPENDENT_AMBULATORY_CARE_PROVIDER_SITE_OTHER): Payer: Medicare Other | Admitting: Osteopathic Medicine

## 2020-09-17 VITALS — BP 148/84 | HR 76 | Temp 98.0°F | Wt 184.1 lb

## 2020-09-17 DIAGNOSIS — I252 Old myocardial infarction: Secondary | ICD-10-CM

## 2020-09-17 DIAGNOSIS — K852 Alcohol induced acute pancreatitis without necrosis or infection: Secondary | ICD-10-CM | POA: Diagnosis not present

## 2020-09-17 DIAGNOSIS — W19XXXA Unspecified fall, initial encounter: Secondary | ICD-10-CM

## 2020-09-17 DIAGNOSIS — J449 Chronic obstructive pulmonary disease, unspecified: Secondary | ICD-10-CM

## 2020-09-17 DIAGNOSIS — Z09 Encounter for follow-up examination after completed treatment for conditions other than malignant neoplasm: Secondary | ICD-10-CM | POA: Diagnosis not present

## 2020-09-17 DIAGNOSIS — I2510 Atherosclerotic heart disease of native coronary artery without angina pectoris: Secondary | ICD-10-CM

## 2020-09-17 DIAGNOSIS — M542 Cervicalgia: Secondary | ICD-10-CM | POA: Diagnosis not present

## 2020-09-17 DIAGNOSIS — F1011 Alcohol abuse, in remission: Secondary | ICD-10-CM

## 2020-09-17 MED ORDER — PREDNISONE 20 MG PO TABS: 20.0000 mg | ORAL_TABLET | Freq: Two times a day (BID) | ORAL | 0 refills | Status: DC

## 2020-09-17 MED ORDER — CYCLOBENZAPRINE HCL 5 MG PO TABS
5.0000 mg | ORAL_TABLET | Freq: Three times a day (TID) | ORAL | 0 refills | Status: DC | PRN
Start: 2020-09-17 — End: 2021-02-17

## 2020-09-17 NOTE — Progress Notes (Signed)
David Woodard is a 69 y.o. male who presents to  [facility]  today, 09/17/20, seeking care for the following:  . Hospital follow-up   o Records reviewed:  - initially admitted 08/23/20 for pancreatitis, left AMA, return to ED 08/25/20 w/ scalp laceration, delirium. Dx EtOH withdrawal and treated for that along w/ pancreatitis, AKI on CKD3, Urinary retention tx w/ Foley, Proteus UTI, CDiff tx w/ po vanc, CAD stable, COPD stable, fatty liver  - Urology: seen yesterday, Foley removed, patient reports feeling a lot better after this. . Today patient reports doing well except for some muscle soreness in right neck going down into his upper back.  Reviewed records from hospitalization, he did have appropriate CT head imaging after his fall, does not look like any cervical spine imaging was performed but patient states the symptoms only started past couple of days.  No numbness/tingling going down the arms, no decreased grip strength. o Patient and I discussed some of the details of his hospitalization, he has very poor memory of what happened, is really surprised that he was in the hospital as long as he was, he remembers waking up on the last day, does not really have a clear idea what he was treated for.  We discussed alcohol withdrawal, likely concussion, delirium.  Patient states that he has abstained from alcohol since his hospital discharge.     ASSESSMENT & PLAN with other pertinent findings:  The primary encounter diagnosis was Hospital discharge follow-up. 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   Patient appears to be doing  well post hospital discharge, has good family support.  No complaints or concerns today other than neck spasm.  No red flags on physical exam -normal active/passive range of motion cervical spine with some discomfort turning head to the right, lung sounds coarse/mild wheeze bilaterally in all lung fields consistent with previous exams/COPD, regular rate rhythm normal S1-S2.  Will trial steroid burst and muscle relaxers, patient has anti-inflammatories at home.   No results found for this or any previous visit (from the past 24 hour(s)).  There are no Patient Instructions on file for this visit.  Orders Placed This Encounter  Procedures  . DG Cervical Spine Complete    Meds ordered this encounter  Medications  . cyclobenzaprine (FLEXERIL) 5 MG tablet    Sig: Take 1-2 tablets (5-10 mg total) by mouth 3 (three) times daily as needed for muscle spasms.    Dispense:  60 tablet    Refill:  0  . predniSONE (DELTASONE) 20 MG tablet    Sig: Take 1 tablet (20 mg total) by mouth 2 (two) times daily with a meal.    Dispense:  10 tablet    Refill:  0       Follow-up instructions: Return in about 3 months (around 12/18/2020) for routine check-up COPD, HTN .                                         BP (!) 148/84 (BP Location: Left Arm, Patient Position: Sitting, Cuff Size: Normal)   Pulse 76   Temp  98 F (36.7 C) (Oral)   Wt 184 lb 1.3 oz (83.5 kg)   BMI 26.41 kg/m   Current Meds  Medication Sig  . albuterol (PROVENTIL HFA;VENTOLIN HFA) 108 (90 Base) MCG/ACT inhaler Inhale into the lungs every 6 (six) hours as needed.  Marland Kitchen albuterol (PROVENTIL) (2.5 MG/3ML) 0.083% nebulizer solution Take 3 mLs (2.5 mg total) by nebulization every 6 (six) hours as needed for wheezing or shortness of breath.  Marland Kitchen aspirin 81 MG tablet Take 81 mg by mouth daily.  Marland Kitchen escitalopram (LEXAPRO) 10 MG tablet Take 1 tablet (10 mg total) by mouth at bedtime.  . fluconazole (DIFLUCAN) 200  MG tablet Take one tablet on the following days: 09/11/2020, 09/14/2020, 09/17/2020, 09/24/2020, 10/01/2020, 10/08/2020.  . folic acid (FOLVITE) 1 MG tablet Take by mouth.  . hydrocortisone 2.5 % cream Apply topically 2 (two) times daily.  Marland Kitchen ketoconazole (NIZORAL) 2 % cream Apply 1 application topically 2 (two) times daily. To affected areas  . metoprolol succinate (TOPROL-XL) 25 MG 24 hr tablet Take by mouth.  . promethazine (PHENERGAN) 25 MG tablet Take 1 tablet (25 mg total) by mouth every 8 (eight) hours as needed for nausea or vomiting.  . thiamine 100 MG tablet Take by mouth.  . Tiotropium Bromide-Olodaterol 2.5-2.5 MCG/ACT AERS Inhale 2 puffs into the lungs daily.    No results found for this or any previous visit (from the past 72 hour(s)).  No results found.     All questions at time of visit were answered - patient instructed to contact office with any additional concerns or updates.  ER/RTC precautions were reviewed with the patient as applicable.   Please note: voice recognition software was used to produce this document, and typos may escape review. Please contact Dr. Sheppard Coil for any needed clarifications.

## 2020-09-21 ENCOUNTER — Telehealth: Payer: Self-pay | Admitting: Osteopathic Medicine

## 2020-09-21 DIAGNOSIS — S161XXS Strain of muscle, fascia and tendon at neck level, sequela: Secondary | ICD-10-CM

## 2020-09-21 DIAGNOSIS — R519 Headache, unspecified: Secondary | ICD-10-CM

## 2020-09-21 NOTE — Telephone Encounter (Signed)
At provider's request - attempted to contact patient regarding headache symptoms from 09/17/20. Direct call back info provided.

## 2020-09-21 NOTE — Telephone Encounter (Addendum)
-----   Message from Emeterio Reeve, DO sent at 09/17/2020 11:53 AM EDT -----  Please call patient: How is headache? Checking up from last visit from 09/17/20

## 2020-09-22 NOTE — Telephone Encounter (Signed)
OK I've sent referral for physical therapy to help with neck pain and headache

## 2020-09-22 NOTE — Telephone Encounter (Signed)
David Woodard reports not feeling any better.

## 2020-09-22 NOTE — Addendum Note (Signed)
Addended by: Maryla Morrow on: 09/22/2020 05:23 PM   Modules accepted: Orders

## 2020-09-23 NOTE — Telephone Encounter (Signed)
Left message advising of recommendations.  

## 2020-09-29 ENCOUNTER — Inpatient Hospital Stay: Payer: Medicare Other | Admitting: Osteopathic Medicine

## 2020-10-05 ENCOUNTER — Other Ambulatory Visit: Payer: Self-pay

## 2020-10-05 ENCOUNTER — Encounter: Payer: Self-pay | Admitting: Osteopathic Medicine

## 2020-10-05 ENCOUNTER — Ambulatory Visit (INDEPENDENT_AMBULATORY_CARE_PROVIDER_SITE_OTHER): Payer: Medicare Other | Admitting: Osteopathic Medicine

## 2020-10-05 VITALS — BP 170/96 | HR 91 | Temp 98.2°F | Wt 184.1 lb

## 2020-10-05 DIAGNOSIS — I708 Atherosclerosis of other arteries: Secondary | ICD-10-CM | POA: Diagnosis not present

## 2020-10-05 DIAGNOSIS — I10 Essential (primary) hypertension: Secondary | ICD-10-CM

## 2020-10-05 DIAGNOSIS — E785 Hyperlipidemia, unspecified: Secondary | ICD-10-CM | POA: Diagnosis not present

## 2020-10-05 DIAGNOSIS — F17200 Nicotine dependence, unspecified, uncomplicated: Secondary | ICD-10-CM

## 2020-10-05 DIAGNOSIS — G4486 Cervicogenic headache: Secondary | ICD-10-CM

## 2020-10-05 DIAGNOSIS — J449 Chronic obstructive pulmonary disease, unspecified: Secondary | ICD-10-CM

## 2020-10-05 DIAGNOSIS — F419 Anxiety disorder, unspecified: Secondary | ICD-10-CM

## 2020-10-05 DIAGNOSIS — I7090 Unspecified atherosclerosis: Secondary | ICD-10-CM

## 2020-10-05 DIAGNOSIS — R42 Dizziness and giddiness: Secondary | ICD-10-CM | POA: Diagnosis not present

## 2020-10-05 MED ORDER — ESCITALOPRAM OXALATE 10 MG PO TABS
10.0000 mg | ORAL_TABLET | Freq: Every day | ORAL | 3 refills | Status: DC
Start: 2020-10-05 — End: 2021-02-17

## 2020-10-05 MED ORDER — ALBUTEROL SULFATE (2.5 MG/3ML) 0.083% IN NEBU: 2.5000 mg | INHALATION_SOLUTION | Freq: Four times a day (QID) | RESPIRATORY_TRACT | 1 refills | Status: DC | PRN

## 2020-10-05 MED ORDER — DICLOFENAC SODIUM 1 % EX GEL: 4.0000 g | Freq: Four times a day (QID) | CUTANEOUS | 11 refills | Status: AC

## 2020-10-05 MED ORDER — ATORVASTATIN CALCIUM 40 MG PO TABS: 40.0000 mg | ORAL_TABLET | Freq: Every day | ORAL | 3 refills | Status: DC

## 2020-10-05 MED ORDER — METOPROLOL SUCCINATE ER 25 MG PO TB24
25.0000 mg | ORAL_TABLET | Freq: Every day | ORAL | 0 refills | Status: DC
Start: 2020-10-05 — End: 2021-01-05

## 2020-10-05 MED ORDER — TIOTROPIUM BROMIDE-OLODATEROL 2.5-2.5 MCG/ACT IN AERS
2.0000 | INHALATION_SPRAY | Freq: Every day | RESPIRATORY_TRACT | 3 refills | Status: DC
Start: 2020-10-05 — End: 2021-04-13

## 2020-10-05 NOTE — Progress Notes (Signed)
David Woodard is a 69 y.o. male who presents to  [place] at [facility]  today, 10/05/20, seeking care for the following:  . ER follow-up, recently seen for dizziness. CTA (+)atherosclerotic change, CT head ok. Meclizine helpful.  Marland Kitchen HTN - uncontrolled. He states today he is not taking BP medications (metoprolol XL 25 mg daily) since he's not sure when   COPD - more mucus production, has been out of nebulized albuterol, he is going to the [place] today to see abotu f/u w/ pulmonologist  . Headache - thinks it is from neck pain, known arthritis, several surgical procedures on C-spine  . T2 compression deformity . Atherosclerosis on CTA  <70%      ASSESSMENT & PLAN with other pertinent findings:  The primary encounter diagnosis was Dizziness. Diagnoses of Atherosclerosis of arteries, Dyslipidemia, Essential hypertension, Chronic obstructive pulmonary disease, unspecified COPD type ([place]), Smoking, Cervicogenic headache, and Anxiety were also pertinent to this visit.    1. Dizziness Improved, continue meclizine prn, posisbly due to elevated BP --> MRI if BP better and dizziness/headache persist, rechecking BP later this week, see below  2. Atherosclerosis of arteries 3. Dyslipidemia --> intensive medical therapy" restart statin, continue ASA, advised tobacco cessation, consider vascular referral / reimaging but pt is not enthusiastic about surgical procedures   4. Essential hypertension --> restart Metoprolol  5. Chronic obstructive pulmonary disease, unspecified COPD type ([place]) Has been out of nebulizer meds --> refilled Rx  6. Smoking --> advised cessation  7. Cervicogenic headache --> trial Voltaren, consider MRI brain to r/o organic problem if BP better but HA persists   8. Anxiety Abrupt D/C SSRI may be contributing to symptoms --> refilled Lexapro  No results found for this or any previous visit (from the past 24  hour(s)).  Patient Instructions  I think a combination of problems may be causing headache / dizziness, possibly stopping Lexapro, stopping Metoprolol, high blood pressure, neck arthritis can be causing your symptoms. Restarting Lexapro and Metoprolol. Rechecking BP end of this week and if headache still a problem if BP looking better, will get MRI / neurology referral.   Refilled COPD medications.   Restated cholesterol medication to prevent stroke.       No orders of the defined types were placed in this encounter.   Meds ordered this encounter  Medications  . metoprolol succinate (TOPROL-XL) 25 MG 24 hr tablet    Sig: Take 1 tablet (25 mg total) by mouth daily.    Dispense:  90 tablet    Refill:  0  . Tiotropium Bromide-Olodaterol 2.5-2.5 MCG/ACT AERS    Sig: Inhale 2 puffs into the lungs daily.    Dispense:  12 g    Refill:  3  . escitalopram (LEXAPRO) 10 MG tablet    Sig: Take 1 tablet (10 mg total) by mouth at bedtime.    Dispense:  90 tablet    Refill:  3  . albuterol (PROVENTIL) (2.5 MG/3ML) 0.083% nebulizer solution    Sig: Take 3 mLs (2.5 mg total) by nebulization every 6 (six) hours as needed for wheezing or shortness of breath.    Dispense:  150 mL    Refill:  1  . atorvastatin (LIPITOR) 40 MG tablet    Sig: Take 1 tablet (40 mg total) by mouth daily.    Dispense:  90 tablet    Refill:  3  . diclofenac Sodium (VOLTAREN) 1 % GEL    Sig:  Apply 4 g topically 4 (four) times daily. To affected area of aches/pain especially to back of neck.    Dispense:  150 g    Refill:  11       Follow-up instructions: Return in about 4 days (around 10/09/2020) for NURSE VISIT BP RECHECK .                                         There were no vitals taken for this visit.  No outpatient medications have been marked as taking for the 10/05/20 encounter (Appointment) with Emeterio Reeve, DO.    No results found for this or any  previous visit (from the past 72 hour(s)).  No results found.     All questions at time of visit were answered - patient instructed to contact office with any additional concerns or updates.  ER/RTC precautions were reviewed with the patient as applicable.   Please note: voice recognition software was used to produce this document, and typos may escape review. Please contact Dr. Sheppard Coil for any needed clarifications.

## 2020-10-05 NOTE — Patient Instructions (Addendum)
I think a combination of problems may be causing headache / dizziness, possibly stopping Lexapro, stopping Metoprolol, high blood pressure, neck arthritis can be causing your symptoms. Restarting Lexapro and Metoprolol. Rechecking BP end of this week and if headache still a problem if BP looking better, will get MRI / neurology referral.   Refilled COPD medications.   Restated cholesterol medication to prevent stroke.

## 2020-10-09 ENCOUNTER — Ambulatory Visit (INDEPENDENT_AMBULATORY_CARE_PROVIDER_SITE_OTHER): Payer: Medicare Other | Admitting: Osteopathic Medicine

## 2020-10-09 ENCOUNTER — Other Ambulatory Visit: Payer: Self-pay

## 2020-10-09 VITALS — BP 146/86 | HR 87 | Ht 70.0 in | Wt 184.0 lb

## 2020-10-09 DIAGNOSIS — I10 Essential (primary) hypertension: Secondary | ICD-10-CM | POA: Diagnosis not present

## 2020-10-09 NOTE — Progress Notes (Signed)
Pt in for bp check.  First reading was 153/81.  After sitting for 10 min it was 146/86.  I saw that he went to cardiology yesterday and his bp there was 136/90.

## 2020-10-12 NOTE — Progress Notes (Signed)
BP Readings from Last 3 Encounters:  10/09/20 (!) 146/86  10/05/20 (!) 170/96  09/17/20 (!) 148/84   Cardiology noted BP not at goal but did not change Rx  10/05/2020 BP high, we restarted metoprolol, it is improved but still not to goal. I think until dizziness is worked up more, will leave Rx as-is for now.

## 2020-10-13 ENCOUNTER — Encounter: Payer: Self-pay | Admitting: Osteopathic Medicine

## 2020-10-13 ENCOUNTER — Other Ambulatory Visit: Payer: Self-pay

## 2020-10-13 ENCOUNTER — Ambulatory Visit (INDEPENDENT_AMBULATORY_CARE_PROVIDER_SITE_OTHER): Payer: Medicare Other | Admitting: Osteopathic Medicine

## 2020-10-13 VITALS — BP 158/81 | HR 81 | Temp 97.8°F | Wt 181.1 lb

## 2020-10-13 DIAGNOSIS — E871 Hypo-osmolality and hyponatremia: Secondary | ICD-10-CM | POA: Diagnosis not present

## 2020-10-13 DIAGNOSIS — Z9119 Patient's noncompliance with other medical treatment and regimen: Secondary | ICD-10-CM

## 2020-10-13 DIAGNOSIS — Z91199 Patient's noncompliance with other medical treatment and regimen due to unspecified reason: Secondary | ICD-10-CM

## 2020-10-13 DIAGNOSIS — R42 Dizziness and giddiness: Secondary | ICD-10-CM

## 2020-10-13 NOTE — Progress Notes (Signed)
David Woodard is a 69 y.o. male who presents to  [place] at [facility]  today, 10/13/20, seeking care for the following:  . ER f/u: seen 10/10/20 (3 days ago) records reviewed  o CC: dizzy 1h PTA, persists in ED, assoc w/ upper abd pain and nausea. Hx crohn's chronic diarrhea, stable. Findings: EKG (+)new PAC, tracing unavailable for review. Normal neuro exam. (+)concern for UTI. Tropes trended and negative. EtOH negative. Hyponatremia. Hypokalemia mild. --> d/c home on Kelfex, no UCx obtained.  o Seen for same about a month ago. Denies EtOH use recently, admission for EtOH pancreatitis 07/2020 . Recent cardiology visit 10/08/20 (5 days ago):  o BP controlled at that time, c/o recent problems w/ dizziness, CP. Hx CAD. -> 48h cardiac monitor, no Rx change, RTC 6wk . Today on further questioning, patient describes these dizziness episodes as feeling significant lightheadedness like he is about to pass out.  It has happened maybe once or twice per week, typically in the evening while sitting on the couch not doing much of anything.  He has some concerns about whether this might be related to panic/anxiety.  He is not sure if he has been taking his Lexapro.  Medication list was reviewed, patient is a fairly poor historian in this area, I asked if there was anyone we could speak to who helps him with his medications but he says he manages everything on his own.     ASSESSMENT & PLAN with other pertinent findings:  The primary encounter diagnosis was Dizzinesses. Diagnoses of Hyponatremia and Current nonadherence to medical treatment were also pertinent to this visit.   I think these presyncopal episodes may be 1 or combination of factors.  No significant vertigo, cardiology work-up is pending with heart monitor.  Will get MRI to evaluate potentially for old CVA though this does not seem quite as likely given lack of vertigo issues.  I think mental health  is probably playing a role, as far as panic/anxiety as patient notes above.  May also be due to inconsistent use of SSRI, may consider switching to a longer half-life medication such as fluoxetine but ideally patient should be more consistent with taking his medications.  See list below, close follow-up.  We will see if we can get family/other support involved for him and would strongly consider home health   Patient Instructions  Plan:  I think the dizziness could be due to several different things, or a combination: 1.  Possible effect of concussion last month 2.  Possible old stroke, will get an MRI to further evaluate 3.  Possible unusual heart rhythm, will await what heart monitor shows from cardiology 4.  Possible panic/anxiety, let us get you back on the Escitalopram/Lexapro  I had like to get your medications on a good daily regimen.  It does not matter which time of day you take your medicines, but please take the following at the same time every day:  1.  Aspirin 2.  atorvastatin/Lipitor 3.  escitalopram/Lexapro 4.  metoprolol/Toprol-XL 5.  Trelegy inhaler  Will check blood work and urine today as well      No orders of the defined types were placed in this encounter.   No orders of the defined types were placed in this encounter.      Follow-up instructions: Return in about 2 weeks (around 10/27/2020) for recheck on medications / review MRI results .  BP (!) 158/81 (BP Location: Left Arm, Patient Position: Sitting, Cuff Size: Normal)   Pulse 81   Temp 97.8 F (36.6 C) (Oral)   Wt 181 lb 1.3 oz (82.1 kg)   SpO2 99%   BMI 25.98 kg/m   Current Meds  Medication Sig  . albuterol (PROVENTIL HFA;VENTOLIN HFA) 108 (90 Base) MCG/ACT inhaler Inhale into the lungs every 6 (six) hours as needed.  Marland Kitchen albuterol (PROVENTIL) (2.5 MG/3ML) 0.083% nebulizer solution Take 3 mLs (2.5 mg total) by  nebulization every 6 (six) hours as needed for wheezing or shortness of breath.  Marland Kitchen aspirin 81 MG tablet Take 81 mg by mouth daily.  Marland Kitchen atorvastatin (LIPITOR) 40 MG tablet Take 1 tablet (40 mg total) by mouth daily.  . cyclobenzaprine (FLEXERIL) 5 MG tablet Take 1-2 tablets (5-10 mg total) by mouth 3 (three) times daily as needed for muscle spasms.  . diclofenac Sodium (VOLTAREN) 1 % GEL Apply 4 g topically 4 (four) times daily. To affected area of aches/pain especially to back of neck.  Marland Kitchen escitalopram (LEXAPRO) 10 MG tablet Take 1 tablet (10 mg total) by mouth at bedtime.  . folic acid (FOLVITE) 1 MG tablet Take by mouth.   . hydrocortisone 2.5 % cream Apply topically 2 (two) times daily.  Marland Kitchen ketoconazole (NIZORAL) 2 % cream Apply 1 application topically 2 (two) times daily. To affected areas  . metoprolol succinate (TOPROL-XL) 25 MG 24 hr tablet Take 1 tablet (25 mg total) by mouth daily.  . promethazine (PHENERGAN) 25 MG tablet Take 1 tablet (25 mg total) by mouth every 8 (eight) hours as needed for nausea or vomiting.  . thiamine 100 MG tablet Take by mouth.  . Tiotropium Bromide-Olodaterol 2.5-2.5 MCG/ACT AERS Inhale 2 puffs into the lungs daily.    No results found for this or any previous visit (from the past 72 hour(s)).  No results found.     All questions at time of visit were answered - patient instructed to contact office with any additional concerns or updates.  ER/RTC precautions were reviewed with the patient as applicable.   Please note: voice recognition software was used to produce this document, and typos may escape review. Please contact Dr. Sheppard Coil for any needed clarifications.   Total encounter time: 40 minutes including record review, face-to-face time with the patient counseling and coordinating care, medication reconciliation.

## 2020-10-13 NOTE — Patient Instructions (Addendum)
Plan:  I think the dizziness could be due to several different things, or a combination: 1.  Possible effect of concussion last month 2.  Possible old stroke, will get an MRI to further evaluate 3.  Possible unusual heart rhythm, will await what heart monitor shows from cardiology 4.  Possible panic/anxiety, let us get you back on the Escitalopram/Lexapro  I had like to get your medications on a good daily regimen.  It does not matter which time of day you take your medicines, but please take the following at the same time every day:  1.  Aspirin 2.  atorvastatin/Lipitor 3.  escitalopram/Lexapro 4.  metoprolol/Toprol-XL 5.  Trelegy inhaler  Will check blood work and urine today as well

## 2020-10-15 LAB — COMPLETE METABOLIC PANEL WITH GFR
AG Ratio: 1.4 (calc) (ref 1.0–2.5)
ALT: 8 U/L — ABNORMAL LOW (ref 9–46)
AST: 9 U/L — ABNORMAL LOW (ref 10–35)
Albumin: 4 g/dL (ref 3.6–5.1)
Alkaline phosphatase (APISO): 75 U/L (ref 35–144)
BUN/Creatinine Ratio: 9 (calc) (ref 6–22)
BUN: 17 mg/dL (ref 7–25)
CO2: 29 mmol/L (ref 20–32)
Calcium: 10.5 mg/dL — ABNORMAL HIGH (ref 8.6–10.3)
Chloride: 100 mmol/L (ref 98–110)
Creat: 1.87 mg/dL — ABNORMAL HIGH (ref 0.70–1.18)
GFR, Est African American: 41 mL/min/{1.73_m2} — ABNORMAL LOW (ref >=60)
GFR, Est Non African American: 35 mL/min/{1.73_m2} — ABNORMAL LOW (ref >=60)
Globulin: 2.8 g/dL (calc) (ref 1.9–3.7)
Glucose, Bld: 90 mg/dL (ref 65–139)
Potassium: 4.6 mmol/L (ref 3.5–5.3)
Sodium: 136 mmol/L (ref 135–146)
Total Bilirubin: 0.5 mg/dL (ref 0.2–1.2)
Total Protein: 6.8 g/dL (ref 6.1–8.1)

## 2020-10-15 LAB — URINALYSIS, ROUTINE W REFLEX MICROSCOPIC
Bilirubin Urine: NEGATIVE
Glucose, UA: NEGATIVE
Hyaline Cast: NONE SEEN /LPF
Ketones, ur: NEGATIVE
Nitrite: NEGATIVE
Specific Gravity, Urine: 1.017 (ref 1.001–1.03)
Squamous Epithelial / HPF: NONE SEEN /HPF (ref <=5)
WBC, UA: >=60 /HPF — AB (ref 0–5)
pH: 5.5 (ref 5.0–8.0)

## 2020-10-15 LAB — OSMOLALITY, URINE: Osmolality, Ur: 464 mOsm/kg (ref 50–1200)

## 2020-10-15 LAB — OSMOLALITY: Osmolality: 282 mOsm/kg (ref 278–305)

## 2020-10-15 LAB — TSH: TSH: 1.37 mIU/L (ref 0.40–4.50)

## 2020-10-15 LAB — SODIUM, URINE, RANDOM: Sodium, Ur: 51 mmol/L (ref 28–272)

## 2020-10-27 ENCOUNTER — Ambulatory Visit: Payer: Medicare Other | Admitting: Osteopathic Medicine

## 2020-11-11 ENCOUNTER — Ambulatory Visit (INDEPENDENT_AMBULATORY_CARE_PROVIDER_SITE_OTHER): Payer: Medicare Other | Admitting: Osteopathic Medicine

## 2020-11-11 ENCOUNTER — Other Ambulatory Visit: Payer: Self-pay

## 2020-11-11 ENCOUNTER — Encounter: Payer: Self-pay | Admitting: Osteopathic Medicine

## 2020-11-11 VITALS — BP 136/78 | HR 75 | Ht 70.0 in | Wt 182.0 lb

## 2020-11-11 DIAGNOSIS — K529 Noninfective gastroenteritis and colitis, unspecified: Secondary | ICD-10-CM | POA: Diagnosis not present

## 2020-11-11 NOTE — Progress Notes (Signed)
David Woodard is a 69 y.o. male who presents to  [place] at [facility]  today, 11/11/20, seeking care for the following:  Hospital follow-up  ER records reviewed from 11/07/20 (4 days ago) c/o and pain c/w previous Crohn's flares. CT Abd/Pelv: "IMPRESSION: 1. Suspected gastroenteritis/pancolitis/Crohn's flareup. 2. Stable 2 cm cystic density along the anterior midpole the left kidney. 3. Mild diffuse urinary bladder wall thickening the liver, which may represent a chronic cystitis.4. Colonic diverticulosis, without acute diverticulitis." Rx Cipro, Flagyl, Norco, Zofran   Today feeling normal, no concerns. He is not taking the Norco or Zofran, he is taking the antibiotics. No abdominal pain, no bloody stool, no lightheadedness. He reprots he has GI follow-up in place      ASSESSMENT & PLAN with other pertinent findings:  The encounter diagnosis was Colitis. Recovering well. Likely Crohn's flare. Finish abx and follow w/ GI      Follow-up instructions: Return in about 3 months (around 02/09/2021) for FOLLOW-UP FOR COPD, SEE ME SOONER IF NEEDED .                                         BP 136/78   Pulse 75   Ht 5' 10"  (1.778 m)   Wt 182 lb (82.6 kg)   BMI 26.11 kg/m   Current Meds  Medication Sig  . ciprofloxacin (CIPRO) 500 MG tablet Take by mouth.  . metroNIDAZOLE (FLAGYL) 500 MG tablet Take by mouth.  . [DISCONTINUED] ondansetron (ZOFRAN-ODT) 4 MG disintegrating tablet Take by mouth.    No results found for this or any previous visit (from the past 72 hour(s)).  No results found.     All questions at time of visit were answered - patient instructed to contact office with any additional concerns or updates.  ER/RTC precautions were reviewed with the patient as applicable.   Please note: voice recognition software was used to produce this document, and typos may escape review. Please  contact Dr. Sheppard Coil for any needed clarifications.  Marland Kitchen

## 2021-01-05 ENCOUNTER — Other Ambulatory Visit: Payer: Self-pay | Admitting: Osteopathic Medicine

## 2021-01-21 ENCOUNTER — Telehealth: Payer: Self-pay

## 2021-01-21 NOTE — Telephone Encounter (Signed)
Transition Care Management Unsuccessful Follow-up Telephone Call  Date of discharge and from where:  01/20/2021 from [facility]   Attempts:  1st Attempt  Reason for unsuccessful TCM follow-up call:  Unable to leave message

## 2021-01-22 NOTE — Telephone Encounter (Signed)
Transition Care Management Unsuccessful Follow-up Telephone Call  Date of discharge and from where:  01/20/2021 from [facility].   Attempts:  2nd Attempt  Reason for unsuccessful TCM follow-up call:  Unable to reach patient

## 2021-01-25 NOTE — Telephone Encounter (Signed)
Transition Care Management Unsuccessful Follow-up Telephone Call  Date of discharge and from where:  01/20/2021 from [facility].  Attempts:  3rd Attempt  Reason for unsuccessful TCM follow-up call:  Unable to leave message

## 2021-01-26 ENCOUNTER — Encounter: Payer: Self-pay | Admitting: Osteopathic Medicine

## 2021-01-26 ENCOUNTER — Other Ambulatory Visit: Payer: Self-pay

## 2021-01-26 ENCOUNTER — Ambulatory Visit (INDEPENDENT_AMBULATORY_CARE_PROVIDER_SITE_OTHER): Payer: Medicare Other | Admitting: Osteopathic Medicine

## 2021-01-26 VITALS — BP 165/85 | HR 61 | Temp 98.2°F | Wt 181.1 lb

## 2021-01-26 DIAGNOSIS — J449 Chronic obstructive pulmonary disease, unspecified: Secondary | ICD-10-CM

## 2021-01-26 NOTE — Progress Notes (Signed)
David Woodard is a 70 y.o. male who presents to  [place] at [facility]  today, 01/26/21, seeking care for the following:  . ED visit 1 week ago 01/19/21, he is here today 01/26/21 to follow-up from COPD exacerbation. Rx prednisone burst and Z-pack. Reports he has upcoming visit w/ pulmonary in a couple weeks. No SOB now. Compliant w/ meds as below. Has stopped smoking! David Woodard!      ASSESSMENT & PLAN with other pertinent findings:  The encounter diagnosis was Chronic obstructive pulmonary disease, unspecified COPD type ([place]).    Patient Instructions  David Woodard for quitting smoking! Any problems / cravings, let me know! Increased cough may occur. That's fine.    Questions for Pulmonary: Changes to inhalers? Candidate for steroids?  Candidate for oxygen?  OK to fax notes to Dr Sheppard Coil [phone]     No orders of the defined types were placed in this encounter.   No orders of the defined types were placed in this encounter.    See below for relevant physical exam findings  See below for recent lab and imaging results reviewed  Medications, allergies, PMH, PSH, SocH, FamH reviewed below    Follow-up instructions: Return if symptoms worsen or fail to improve. / routine check-up in 6 mos or as needed                                         Exam:  BP (!) 165/85 (BP Location: Left Arm, Patient Position: Sitting, Cuff Size: Normal)   Pulse 61   Temp 98.2 F (36.8 C) (Oral)   Wt 181 lb 1 oz (82.1 kg)   SpO2 99%   BMI 25.98 kg/m   Constitutional: VS see above. General Appearance: alert, well-developed, well-nourished, NAD  Neck: No masses, trachea midline.   Respiratory: Normal respiratory effort. Diminished breath sounds bilaterally stable from previous exam. Faint scattered wheeze, no rhonchi, no rales  Cardiovascular: S1/S2 normal, no murmur, no rub/gallop auscultated. RRR.    Musculoskeletal: Gait normal. Symmetric and independent movement of all extremities  Neurological: Normal balance/coordination. No tremor.  Skin: warm, dry, intact.   Psychiatric: Normal judgment/insight. Normal mood and affect. Oriented x3.   Current Meds  Medication Sig  . albuterol (PROVENTIL HFA;VENTOLIN HFA) 108 (90 Base) MCG/ACT inhaler Inhale into the lungs every 6 (six) hours as needed.  Marland Kitchen albuterol (PROVENTIL) (2.5 MG/3ML) 0.083% nebulizer solution Take 3 mLs (2.5 mg total) by nebulization every 6 (six) hours as needed for wheezing or shortness of breath.  Marland Kitchen aspirin 81 MG tablet Take 81 mg by mouth daily.  Marland Kitchen atorvastatin (LIPITOR) 40 MG tablet Take 1 tablet (40 mg total) by mouth daily.  . cyclobenzaprine (FLEXERIL) 5 MG tablet Take 1-2 tablets (5-10 mg total) by mouth 3 (three) times daily as needed for muscle spasms.  . diclofenac Sodium (VOLTAREN) 1 % GEL Apply 4 g topically 4 (four) times daily. To affected area of aches/pain especially to back of neck.  Marland Kitchen escitalopram (LEXAPRO) 10 MG tablet Take 1 tablet (10 mg total) by mouth at bedtime.  . folic acid (FOLVITE) 1 MG tablet Take by mouth.   . hydrocortisone 2.5 % cream Apply topically 2 (two) times daily.  Marland Kitchen ketoconazole (NIZORAL) 2 % cream Apply 1 application topically 2 (two) times daily. To affected areas  . metoprolol succinate (TOPROL-XL) 25 MG 24 hr tablet  TAKE ONE TABLET BY MOUTH DAILY  . promethazine (PHENERGAN) 25 MG tablet Take 1 tablet (25 mg total) by mouth every 8 (eight) hours as needed for nausea or vomiting.  . thiamine 100 MG tablet Take by mouth.  . Tiotropium Bromide-Olodaterol 2.5-2.5 MCG/ACT AERS Inhale 2 puffs into the lungs daily.    Allergies  Allergen Reactions  . Bupropion Nausea And Vomiting    Patient Active Problem List   Diagnosis Date Noted  . Intertrigo of genitocrural region due to Candida species 09/11/2020  . Laceration of scalp without foreign body 09/11/2020  . Nasal congestion  10/23/2019  . Tubular adenoma of colon 08/30/2018  . Diverticula of colon 08/30/2018  . Dyslipidemia 10/31/2016  . Coronary artery disease with history of myocardial infarction without history of CABG 06/14/2016  . Crohn's ileitis ([place]) 09/15/2015  . Coronary artery disease involving native coronary artery of native heart without angina pectoris 03/30/2015  . S/P coronary artery stent placement 03/30/2015  . History of intussusception 03/16/2015  . Smoking 11/11/2010  . COPD (chronic obstructive pulmonary disease) with chronic bronchitis ([place]) 11/11/2010    Family History  Problem Relation Age of Onset  . Hypertension Father   . Heart disease Father     Social History   Tobacco Use  Smoking Status Current Every Day Smoker  . Packs/day: 1.75  . Types: Cigarettes  Smokeless Tobacco Never Used    Past Surgical History:  Procedure Laterality Date  . CERVICAL FUSION    . CORONARY ANGIOPLASTY WITH STENT PLACEMENT      Immunization History  Administered Date(s) Administered  . PFIZER(Purple Top)SARS-COV-2 Vaccination 01/17/2020, 02/07/2020  . Pneumococcal Conjugate-13 10/17/2018    No results found for this or any previous visit (from the past 2160 hour(s)).  No results found.     All questions at time of visit were answered - patient instructed to contact office with any additional concerns or updates. ER/RTC precautions were reviewed with the patient as applicable.   Please note: manual typing as well as voice recognition software may have been used to produce this document - typos may escape review. Please contact Dr. Sheppard Coil for any needed clarifications.

## 2021-01-26 NOTE — Patient Instructions (Addendum)
Yay for quitting smoking! Any problems / cravings, let me know! Increased cough may occur. That's fine.    Questions for Pulmonary: Changes to inhalers? Candidate for steroids?  Candidate for oxygen?  OK to fax notes to Dr Sheppard Coil [phone]

## 2021-02-09 ENCOUNTER — Ambulatory Visit (INDEPENDENT_AMBULATORY_CARE_PROVIDER_SITE_OTHER): Payer: Medicare Other | Admitting: Osteopathic Medicine

## 2021-02-09 ENCOUNTER — Encounter: Payer: Self-pay | Admitting: Osteopathic Medicine

## 2021-02-09 ENCOUNTER — Other Ambulatory Visit: Payer: Self-pay

## 2021-02-09 VITALS — BP 160/90 | HR 91 | Wt 178.1 lb

## 2021-02-09 DIAGNOSIS — R42 Dizziness and giddiness: Secondary | ICD-10-CM

## 2021-02-09 DIAGNOSIS — J449 Chronic obstructive pulmonary disease, unspecified: Secondary | ICD-10-CM

## 2021-02-09 DIAGNOSIS — I10 Essential (primary) hypertension: Secondary | ICD-10-CM | POA: Diagnosis not present

## 2021-02-09 DIAGNOSIS — G4486 Cervicogenic headache: Secondary | ICD-10-CM

## 2021-02-09 DIAGNOSIS — I708 Atherosclerosis of other arteries: Secondary | ICD-10-CM | POA: Diagnosis not present

## 2021-02-09 DIAGNOSIS — I7090 Unspecified atherosclerosis: Secondary | ICD-10-CM

## 2021-02-09 MED ORDER — VALSARTAN-HYDROCHLOROTHIAZIDE 80-12.5 MG PO TABS: 1.0000 | ORAL_TABLET | Freq: Every day | ORAL | 0 refills | Status: DC

## 2021-02-09 NOTE — Progress Notes (Signed)
David Woodard is a 70 y.o. male who presents to  [place] at [facility]  today, 02/09/21, seeking care for the following:  . ER follow-up. Multiple visits for SOB, dizziness. Pt is unsure of his medication list. Not sure that he's on anything for BP. Today still mild dizziness, concerned whether he can continue meclizine. Has appt w/ pulmonology tomorrow. Known poorly controlled COPD.   BP Readings from Last 3 Encounters:  02/09/21 (!) 187/107 --> 160/90  01/26/21 (!) 165/85  11/11/20 136/78     ASSESSMENT & PLAN with other pertinent findings:  The primary encounter diagnosis was Chronic obstructive pulmonary disease, unspecified COPD type ([place]). Diagnoses of Dizzinesses, Essential hypertension, Atherosclerosis of arteries, and Cervicogenic headache were also pertinent to this visit.    There are no Patient Instructions on file for this visit.  No orders of the defined types were placed in this encounter.   Meds ordered this encounter  Medications  . valsartan-hydrochlorothiazide (DIOVAN HCT) 80-12.5 MG tablet    Sig: Take 1 tablet by mouth daily.    Dispense:  90 tablet    Refill:  0     See below for relevant physical exam findings  See below for recent lab and imaging results reviewed  Medications, allergies, PMH, PSH, SocH, FamH reviewed below    Follow-up instructions: Return in about 1 week (around 02/16/2021) for MEDICATION REVIEW - BRING *ALL* MEDICINES IN YOUR HOUSE TO THE OFFICE (fyi to scheduler [id number]).                                        Exam:  BP (!) 160/90 (BP Location: Left Arm, Patient Position: Sitting, Cuff Size: Large)   Pulse 91   Wt 178 lb 1.3 oz (80.8 kg)   SpO2 100%   BMI 25.55 kg/m   Constitutional: VS see above. General Appearance: alert, well-developed, well-nourished, NAD  Neck: No masses, trachea midline.   Respiratory: Normal respiratory effort. no  wheeze, no rhonchi, no rales  Cardiovascular: S1/S2 normal, no murmur, no rub/gallop auscultated. RRR.   Musculoskeletal: Gait normal. Symmetric and independent movement of all extremities  Neurological: Normal balance/coordination. No tremor.  Skin: warm, dry, intact.   Psychiatric: Fair judgment/insight. Normal mood and affect. Oriented x3.   Current Meds  Medication Sig  . albuterol (PROVENTIL HFA;VENTOLIN HFA) 108 (90 Base) MCG/ACT inhaler Inhale into the lungs every 6 (six) hours as needed.  Marland Kitchen albuterol (PROVENTIL) (2.5 MG/3ML) 0.083% nebulizer solution Take 3 mLs (2.5 mg total) by nebulization every 6 (six) hours as needed for wheezing or shortness of breath.  Marland Kitchen aspirin 81 MG tablet Take 81 mg by mouth daily.  Marland Kitchen atorvastatin (LIPITOR) 40 MG tablet Take 1 tablet (40 mg total) by mouth daily.  . cyclobenzaprine (FLEXERIL) 5 MG tablet Take 1-2 tablets (5-10 mg total) by mouth 3 (three) times daily as needed for muscle spasms.  . diclofenac Sodium (VOLTAREN) 1 % GEL Apply 4 g topically 4 (four) times daily. To affected area of aches/pain especially to back of neck.  Marland Kitchen escitalopram (LEXAPRO) 10 MG tablet Take 1 tablet (10 mg total) by mouth at bedtime.  . folic acid (FOLVITE) 1 MG tablet Take by mouth.   . hydrocortisone 2.5 % cream Apply topically 2 (two) times daily.  Marland Kitchen ketoconazole (NIZORAL) 2 % cream Apply 1 application topically 2 (two) times daily.  To affected areas  . promethazine (PHENERGAN) 25 MG tablet Take 1 tablet (25 mg total) by mouth every 8 (eight) hours as needed for nausea or vomiting.  . thiamine 100 MG tablet Take by mouth.  . Tiotropium Bromide-Olodaterol 2.5-2.5 MCG/ACT AERS Inhale 2 puffs into the lungs daily.  . valsartan-hydrochlorothiazide (DIOVAN HCT) 80-12.5 MG tablet Take 1 tablet by mouth daily.    Allergies  Allergen Reactions  . Bupropion Nausea And Vomiting    Patient Active Problem List   Diagnosis Date Noted  . Intertrigo of genitocrural region  due to Candida species 09/11/2020  . Laceration of scalp without foreign body 09/11/2020  . Nasal congestion 10/23/2019  . Tubular adenoma of colon 08/30/2018  . Diverticula of colon 08/30/2018  . Dyslipidemia 10/31/2016  . Coronary artery disease with history of myocardial infarction without history of CABG 06/14/2016  . Crohn's ileitis ([place]) 09/15/2015  . Coronary artery disease involving native coronary artery of native heart without angina pectoris 03/30/2015  . S/P coronary artery stent placement 03/30/2015  . History of intussusception 03/16/2015  . Smoking 11/11/2010  . COPD (chronic obstructive pulmonary disease) with chronic bronchitis ([place]) 11/11/2010    Family History  Problem Relation Age of Onset  . Hypertension Father   . Heart disease Father     Social History   Tobacco Use  Smoking Status Current Every Day Smoker  . Packs/day: 1.75  . Types: Cigarettes  Smokeless Tobacco Never Used    Past Surgical History:  Procedure Laterality Date  . CERVICAL FUSION    . CORONARY ANGIOPLASTY WITH STENT PLACEMENT      Immunization History  Administered Date(s) Administered  . PFIZER(Purple Top)SARS-COV-2 Vaccination 01/17/2020, 02/07/2020  . Pneumococcal Conjugate-13 10/17/2018    No results found for this or any previous visit (from the past 2160 hour(s)).  No results found.     All questions at time of visit were answered - patient instructed to contact office with any additional concerns or updates. ER/RTC precautions were reviewed with the patient as applicable.   Please note: manual typing as well as voice recognition software may have been used to produce this document - typos may escape review. Please contact Dr. Sheppard Coil for any needed clarifications.

## 2021-02-11 ENCOUNTER — Encounter: Payer: Self-pay | Admitting: Osteopathic Medicine

## 2021-02-17 ENCOUNTER — Encounter: Payer: Self-pay | Admitting: Osteopathic Medicine

## 2021-02-17 ENCOUNTER — Ambulatory Visit (INDEPENDENT_AMBULATORY_CARE_PROVIDER_SITE_OTHER): Payer: Medicare Other | Admitting: Osteopathic Medicine

## 2021-02-17 ENCOUNTER — Other Ambulatory Visit: Payer: Self-pay

## 2021-02-17 VITALS — BP 156/101 | HR 89 | Temp 98.3°F | Wt 178.1 lb

## 2021-02-17 DIAGNOSIS — R42 Dizziness and giddiness: Secondary | ICD-10-CM

## 2021-02-17 DIAGNOSIS — J449 Chronic obstructive pulmonary disease, unspecified: Secondary | ICD-10-CM | POA: Diagnosis not present

## 2021-02-17 DIAGNOSIS — I10 Essential (primary) hypertension: Secondary | ICD-10-CM

## 2021-02-17 MED ORDER — PROMETHAZINE HCL 25 MG PO TABS
25.0000 mg | ORAL_TABLET | Freq: Three times a day (TID) | ORAL | 3 refills | Status: DC | PRN
Start: 2021-02-17 — End: 2021-04-13

## 2021-02-17 MED ORDER — VALSARTAN-HYDROCHLOROTHIAZIDE 80-12.5 MG PO TABS: 2.0000 | ORAL_TABLET | Freq: Every day | ORAL | 0 refills | Status: DC

## 2021-02-17 MED ORDER — ATORVASTATIN CALCIUM 40 MG PO TABS: 40.0000 mg | ORAL_TABLET | Freq: Every day | ORAL | 3 refills | Status: DC

## 2021-02-17 NOTE — Patient Instructions (Signed)
Plan: See medication list last page Blood pressure medicine: take 2 pills once per day  Will recheck blood pressure next week

## 2021-02-17 NOTE — Progress Notes (Signed)
David Woodard is a 70 y.o. male who presents to  [place] at [facility]  today, 02/17/21, seeking care for the following:  Marland Kitchen Medication list has had few discrepancies on it, especially between our office, [facility], and I do not have access to records at the [place]. Pt bring in all pills, inhalers, OTC, other medications: Spiriva using q AM, Asmanex using q PM, ASA 325 mg daily, Albuterol prn, Valsartan 80-12.5 daily, out of Lipitor, Meclizine, Phenergan. Taking Voltaren gel prn, Astelin spray prn.   Current Meds  Medication Sig  . albuterol (PROVENTIL HFA;VENTOLIN HFA) 108 (90 Base) MCG/ACT inhaler Inhale into the lungs every 6 (six) hours as needed.  Marland Kitchen aspirin 325 MG tablet Take 325 mg by mouth daily.  Marland Kitchen azelastine (ASTELIN) 0.1 % nasal spray Place 2 sprays into both nostrils 2 (two) times daily. Use in each nostril as needed for sinus congestion  . diclofenac Sodium (VOLTAREN) 1 % GEL Apply 4 g topically 4 (four) times daily. To affected area of aches/pain especially to back of neck.  . mometasone (ASMANEX) 220 MCG/INH inhaler Inhale 2 puffs into the lungs daily.  . Tiotropium Bromide-Olodaterol 2.5-2.5 MCG/ACT AERS Inhale 2 puffs into the lungs daily.  . [DISCONTINUED] valsartan-hydrochlorothiazide (DIOVAN HCT) 80-12.5 MG tablet Take 1 tablet by mouth daily.          ASSESSMENT & PLAN with other pertinent findings:  The primary encounter diagnosis was Chronic obstructive pulmonary disease, unspecified COPD type ([place]). Diagnoses of Dizzinesses and Essential hypertension were also pertinent to this visit.   1. Chronic obstructive pulmonary disease, unspecified COPD type [facility]) Following w/ pulmonology at the [facility]  2. Dizzinesses Persistent issues, likely BPPV Referral for vestibular rehab PT   3. Essential hypertension Increase Valsartan-HCT RTC 1 week [place] BP check, goal <140/90 can refill at 160-25 mg   Patient Instructions  Plan: See  medication list last page Blood pressure medicine: take 2 pills once per day  Will recheck blood pressure next week      No orders of the defined types were placed in this encounter.   Meds ordered this encounter  Medications  . promethazine (PHENERGAN) 25 MG tablet    Sig: Take 1 tablet (25 mg total) by mouth every 8 (eight) hours as needed for nausea or vomiting.    Dispense:  30 tablet    Refill:  3  . atorvastatin (LIPITOR) 40 MG tablet    Sig: Take 1 tablet (40 mg total) by mouth daily.    Dispense:  90 tablet    Refill:  3  . valsartan-hydrochlorothiazide (DIOVAN HCT) 80-12.5 MG tablet    Sig: Take 2 tablets by mouth daily.    Dispense:  90 tablet    Refill:  0     See below for relevant physical exam findings  See below for recent lab and imaging results reviewed  Medications, allergies, PMH, PSH, SocH, FamH reviewed below    Follow-up instructions: Return in about 1 week (around 02/24/2021) for NURSE VISIT - BP CHECK .                                        Exam:  BP (!) 156/101 (BP Location: Left Arm, Patient Position: Sitting, Cuff Size: Normal)   Pulse 89   Temp 98.3 F (36.8 C) (Oral)   Wt 178 lb 1.9  oz (80.8 kg)   SpO2 100%   BMI 25.56 kg/m   Constitutional: VS see above. General Appearance: alert, well-developed, thin but not cachectic, NAD  Neck: No masses, trachea midline.   Respiratory: Normal respiratory effort. Diffuse wheeze/ronchi stable  Cardiovascular: S1/S2 normal, no murmur, no rub/gallop auscultated. RRR.   Neurological: Normal balance/coordination. No tremor.  Skin: warm, dry, intact.   Psychiatric: Normal judgment/insight. Normal mood and affect. Oriented x3.   Current Meds  Medication Sig  . albuterol (PROVENTIL HFA;VENTOLIN HFA) 108 (90 Base) MCG/ACT inhaler Inhale into the lungs every 6 (six) hours as needed.  Marland Kitchen aspirin 325 MG tablet Take 325 mg by mouth daily.  Marland Kitchen azelastine (ASTELIN)  0.1 % nasal spray Place 2 sprays into both nostrils 2 (two) times daily. Use in each nostril as needed for sinus congestion  . diclofenac Sodium (VOLTAREN) 1 % GEL Apply 4 g topically 4 (four) times daily. To affected area of aches/pain especially to back of neck.  . mometasone (ASMANEX) 220 MCG/INH inhaler Inhale 2 puffs into the lungs daily.  . Tiotropium Bromide-Olodaterol 2.5-2.5 MCG/ACT AERS Inhale 2 puffs into the lungs daily.  . [DISCONTINUED] valsartan-hydrochlorothiazide (DIOVAN HCT) 80-12.5 MG tablet Take 1 tablet by mouth daily.    Allergies  Allergen Reactions  . Bupropion Nausea And Vomiting    Patient Active Problem List   Diagnosis Date Noted  . Intertrigo of genitocrural region due to Candida species 09/11/2020  . Laceration of scalp without foreign body 09/11/2020  . Nasal congestion 10/23/2019  . Tubular adenoma of colon 08/30/2018  . Diverticula of colon 08/30/2018  . Dyslipidemia 10/31/2016  . Coronary artery disease with history of myocardial infarction without history of CABG 06/14/2016  . Crohn's ileitis ([place]) 09/15/2015  . Coronary artery disease involving native coronary artery of native heart without angina pectoris 03/30/2015  . S/P coronary artery stent placement 03/30/2015  . History of intussusception 03/16/2015  . Smoking 11/11/2010  . COPD (chronic obstructive pulmonary disease) with chronic bronchitis ([place]) 11/11/2010    Family History  Problem Relation Age of Onset  . Hypertension Father   . Heart disease Father     Social History   Tobacco Use  Smoking Status Current Every Day Smoker  . Packs/day: 1.75  . Types: Cigarettes  Smokeless Tobacco Never Used    Past Surgical History:  Procedure Laterality Date  . CERVICAL FUSION    . CORONARY ANGIOPLASTY WITH STENT PLACEMENT      Immunization History  Administered Date(s) Administered  . PFIZER(Purple Top)SARS-COV-2 Vaccination 01/17/2020, 02/07/2020  . Pneumococcal Conjugate-13  10/17/2018    No results found for this or any previous visit (from the past 2160 hour(s)).  No results found.     All questions at time of visit were answered - patient instructed to contact office with any additional concerns or updates. ER/RTC precautions were reviewed with the patient as applicable.   Please note: manual typing as well as voice recognition software may have been used to produce this document - typos may escape review. Please contact Dr. Sheppard Coil for any needed clarifications.   Total encounter time on date of service, 02/17/21, was 30 minutes spent addressing problems/issues as noted above in [place], reviewing home meds and discrepancies in medication list - medication reconciliation, including time spent in discussion with patient regarding the HPI, ROS, confirming history, reviewing Assessment & Plan, as well as time spent on coordination of care, record review.

## 2021-02-24 ENCOUNTER — Ambulatory Visit (INDEPENDENT_AMBULATORY_CARE_PROVIDER_SITE_OTHER): Payer: Medicare Other | Admitting: Osteopathic Medicine

## 2021-02-24 ENCOUNTER — Telehealth: Payer: Self-pay | Admitting: General Practice

## 2021-02-24 ENCOUNTER — Other Ambulatory Visit: Payer: Self-pay

## 2021-02-24 DIAGNOSIS — I2510 Atherosclerotic heart disease of native coronary artery without angina pectoris: Secondary | ICD-10-CM

## 2021-02-24 NOTE — Progress Notes (Signed)
  Pt presents to clinic today for BP check. Denies any cp/palpitaions/headaches/or swelling.  He has sob associated with COPD. His dizziness is from vertigo.   Pt told that he will need a f/u appt for his recent hospital discharge [facility]) for COPD excerebration.

## 2021-02-24 NOTE — Telephone Encounter (Signed)
Transition Care Management Unsuccessful Follow-up Telephone Call  Date of discharge and from where:  [facility] 02/24/21  Attempts:  1st Attempt  Reason for unsuccessful TCM follow-up call:  Voice mail full

## 2021-02-25 NOTE — Progress Notes (Signed)
BP Readings from Last 3 Encounters:  02/24/21 134/81  02/17/21 (!) 156/101  02/09/21 (!) 160/90   Continue current meds Will see him soon  Confirmed a/ nurse that dizziness stable / chronic, see previous notes, pt has declined vestibular rehab

## 2021-02-26 NOTE — Telephone Encounter (Signed)
Transition Care Management Unsuccessful Follow-up Telephone Call  Date of discharge and from where:  [facility] 02/24/21  Attempts:  2nd Attempt  Reason for unsuccessful TCM follow-up call:  Voice mail full  Patient did come to the office for a nurse visit on 02/24/21 and made a follow up to see Dr. Sheppard Coil on 03/03/21.

## 2021-03-01 ENCOUNTER — Telehealth: Payer: Self-pay | Admitting: *Deleted

## 2021-03-01 NOTE — Telephone Encounter (Signed)
Transition Care Management Unsuccessful Follow-up Telephone Call  Date of discharge and from where:  02/28/2021 - [facility]  Attempts:  1st Attempt  Reason for unsuccessful TCM follow-up call:  Unable to reach patient

## 2021-03-02 NOTE — Telephone Encounter (Signed)
Transition Care Management Follow-up Telephone Call  Date of discharge and from where: 02/28/2021 - [facility]  How have you been since you were released from the hospital? "Fine"  Any questions or concerns? No  Items Reviewed:  Did the pt receive and understand the discharge instructions provided? Yes   Medications obtained and verified? Yes   Other? No   Any new allergies since your discharge? No   Dietary orders reviewed? No  Do you have support at home? Yes    Functional Questionnaire: (I = Independent and D = Dependent) ADLs: I  Bathing/Dressing- I  Meal Prep- I  Eating- I  Maintaining continence- I  Transferring/Ambulation- I  Managing Meds- I  Follow up appointments reviewed:   PCP Hospital f/u appt confirmed? Yes  Scheduled to see David Woodard on 03/03/2021 @ 1400.  [facility] f/u appt confirmed? No    Are transportation arrangements needed? No   If their condition worsens, is the pt aware to call PCP or go to the Emergency Dept.? Yes  Was the patient provided with contact information for the PCP's office or ED? Yes  Was to pt encouraged to call back with questions or concerns? Yes

## 2021-03-03 ENCOUNTER — Telehealth: Payer: Self-pay | Admitting: General Practice

## 2021-03-03 ENCOUNTER — Other Ambulatory Visit: Payer: Self-pay

## 2021-03-03 ENCOUNTER — Encounter: Payer: Self-pay | Admitting: Osteopathic Medicine

## 2021-03-03 ENCOUNTER — Ambulatory Visit (INDEPENDENT_AMBULATORY_CARE_PROVIDER_SITE_OTHER): Payer: Medicare Other | Admitting: Osteopathic Medicine

## 2021-03-03 VITALS — BP 142/77 | HR 95 | Temp 98.5°F | Wt 178.0 lb

## 2021-03-03 DIAGNOSIS — J449 Chronic obstructive pulmonary disease, unspecified: Secondary | ICD-10-CM | POA: Diagnosis not present

## 2021-03-03 DIAGNOSIS — K229 Disease of esophagus, unspecified: Secondary | ICD-10-CM | POA: Diagnosis not present

## 2021-03-03 MED ORDER — IPRATROPIUM-ALBUTEROL 0.5-2.5 (3) MG/3ML IN SOLN: 3.0000 mL | RESPIRATORY_TRACT | 99 refills | Status: DC | PRN

## 2021-03-03 NOTE — Telephone Encounter (Signed)
Transition Care Management Unsuccessful Follow-up Telephone Call  Date of discharge and from where:  [facility] 02/24/21  Attempts:  3rd Attempt  Reason for unsuccessful TCM follow-up call:  Voice mail full Patient has a follow up with Dr. Sheppard Coil on 03/03/21.

## 2021-03-03 NOTE — Patient Instructions (Signed)
How to Use a Nebulizer, Adult  A nebulizer is a device that turns liquid medicine into a mist or vapor that you can breathe in (inhale). This medicine helps to open the air passages in your lungs. You may need to use a nebulizer if you have an acute breathing illness, such as pneumonia. A nebulizer may also be used to treat chronic conditions, such as asthma or chronic obstructive pulmonarydisease (COPD). There are different kinds of nebulizers. With some nebulizers, you breathe in medicine through a mouthpiece. With others, you get medicine through a mask that fits over your nose and mouth. What are the risks? If you use a nebulizer that does not fit right or is not cleaned properly, it can cause some problems, including:  Infection.  Eye irritation.  Delivery of too much medicine or not enough medicine.  Mouth irritation. Supplies needed:  Air compressor (nebulizer machine).  Nebulizer medicine cup (reservoir)and tubing.  Mouthpiece or face mask.  Soap and water.  Sterile or distilled water.  Clean towel. How to use a nebulizer Preparing a nebulizer Take these steps before using your nebulizer: 1. Read the manufacturer's instructions for your nebulizer, as machines vary. 2. Check your medicine. Make sure it has not expired and is not damaged in any way. 3. Wash your hands with soap and water. 4. Put all of the parts of your nebulizer on a sturdy, flat surface. 5. Connect the tubing to the nebulizer machine and to the reservoir. 6. Measure the liquid medicine according to instructions from your health care provider. Pour the liquid into the reservoir. 7. Attach the mouthpiece or mask. 8. Test the nebulizer by turning it on to make sure that a spray comes out. Then, turn it off. Using a nebulizer Be sure to stop the machine at any time if you start coughing or if the medicine foams or bubbles. 1. Sit in an upright, relaxed position. 2. If your nebulizer has a mask, put it  over your nose and mouth. It should fit somewhat snugly, with no gaps around the nose or cheeks where medicine could escape. If you use a mouthpiece, put it in your mouth. Press your lips firmly around the mouthpiece. 3. Turn on the nebulizer. 4. Some nebulizers have a finger valve. If yours does, cover up the air hole so the air gets to the nebulizer. 5. Once the medicine begins to mist out, take slow, deep breaths. If there is a finger valve, release it at the end of your breath. 6. Continue taking slow, deep breaths until the medicine in the nebulizer is gone and no mist appears.      Cleaning a nebulizer The nebulizer and all of its parts must be kept very clean. If the nebulizer and its parts are not cleaned properly, bacteria can grow inside of them. If you inhale the bacteria, you can get sick. Follow the manufacturer's instructions for cleaning your nebulizer. For most nebulizers, you should follow these guidelines:  Clean the mouthpiece or mask and the reservoir by: ? Rinsing them after each use. Use sterile or distilled water. ? Washing them 1-2 times a week using soap and warm water.  Do not wash the tubing.  After you rinse or wash them, place the parts on a clean towel and let them air-dry completely. After they dry, reconnect the pieces and turn the nebulizer on without any medicine in it. Doing this will blow air through the equipment to help dry it out.  Store the  nebulizer in a clean and dust-free place.  Check the filter at least one time every week. Replace the filter if it looks dirty. Follow these instructions at home  Use your nebulizer only as told by your health care provider. Do not use the nebulizer more than directed by your health care provider.  Do not use any products that contain nicotine or tobacco, such as cigarettes, e-cigarettes, and chewing tobacco. If you need help quitting, ask your health care provider.  Keep all follow-up visits as told by your  health care provider. This is important. Where to find more information  Allergy & Asthma Network: allergyasthmanetwork.org  American Lung Association: www.lung.org Contact a health care provider if:  You have trouble using the nebulizer.  Your nebulizer foams or stops working.  Your nebulizer does not create a mist after you add medicine and turn it on. Get help right away if:  You continue to have trouble breathing.  Your breathing gets worse during a nebulizer treatment. These symptoms may represent a serious problem that is an emergency. Do not wait to see if the symptoms will go away. Get medical help right away. Call your local emergency services (911 in the [place] Do not drive yourself to the hospital. Summary  A nebulizer is a device that turns liquid medicine into a mist (vapor) that you can breathe in (inhale).  Measure the liquid medicine according to instructions from your health care provider. Pour the liquid into the part of the nebulizer that holds the medicine (reservoir).  Once the medicine begins to mist out, take slow, deep breaths.  Rinse or wash the mouthpiece or mask and the reservoir after each use, and allow them to air-dry completely. This information is not intended to replace advice given to you by your health care provider. Make sure you discuss any questions you have with your health care provider. Document Revised: 07/27/2020 Document Reviewed: 12/25/2019 Elsevier Patient Education  2021 [employer].

## 2021-03-03 NOTE — Telephone Encounter (Signed)
Transition Care Management Unsuccessful Follow-up Telephone Call  Date of discharge and from where:  02/28/21 [facility]  Attempts:  1st Attempt  Reason for unsuccessful TCM follow-up call:  Voice mail full. Patient has a follow up scheduled for 03/03/21 at 1400

## 2021-03-04 ENCOUNTER — Telehealth: Payer: Self-pay | Admitting: Osteopathic Medicine

## 2021-03-04 DIAGNOSIS — K229 Disease of esophagus, unspecified: Secondary | ICD-10-CM

## 2021-03-04 NOTE — Progress Notes (Signed)
David Woodard is a 70 y.o. male who presents to  [place] at [facility]  today, 03/03/21, seeking care for the following:  . Emergency room follow-up, COPD treatment.  Patient makes frequent ED visits for SOB not alleviated by albuterol.  He has picked up smoking again, due to increased stress over recent sudden death of his girlfriend.  He has a pulmonologist through the [place], no records available for review.  He has a nebulizer machine at home but he states he is out of refills for the nebulizer medication.  He states typically the nebs given in the ED helped shortness of breath . ED records reviewed.  CT angio done, no PE or acute pulmonary findings, it did note diffuse esophageal wall thickening which may represent esophagitis or other etiologies, follow-up was recommended.  Cardiomegaly and emphysema also noted on CT.     ASSESSMENT & PLAN with other pertinent findings:  The primary encounter diagnosis was Chronic obstructive pulmonary disease, unspecified COPD type ([place]). A diagnosis of Esophageal abnormality -incidental finding on CT angiogram chest from emergency department visit 02/2021 was also pertinent to this visit.   Refilled nebulizer medications, action plan for shortness of breath includes: Albuterol inhaler, if this is not helping trial duo nebs, if this is not helping and severe shortness of breath persists (recognizing that some minor shortness of breath is probably baseline/expected given severity of his illness), okay to seek emergency care if he feels that it is necessary.  Advised patient he is never wrong to get checked out if he is worried, but if we can help keep him out of the emergency department that is best to avoid other infectious disease exposures, etc.  Ideally, would also like him to see his pulmonologist sooner than scheduled follow-up.  Not sure if they are able to review ED records or are aware of how often he is going  to the emergency department for COPD.  Would also strongly consider palliative care involvement for disease management  Will refer to GI for further evaluation of esophageal thickening noted on CT    Patient Instructions   How to Use a Nebulizer, Adult  A nebulizer is a device that turns liquid medicine into a mist or vapor that you can breathe in (inhale). This medicine helps to open the air passages in your lungs. You may need to use a nebulizer if you have an acute breathing illness, such as pneumonia. A nebulizer may also be used to treat chronic conditions, such as asthma or chronic obstructive pulmonarydisease (COPD). There are different kinds of nebulizers. With some nebulizers, you breathe in medicine through a mouthpiece. With others, you get medicine through a mask that fits over your nose and mouth. What are the risks? If you use a nebulizer that does not fit right or is not cleaned properly, it can cause some problems, including:  Infection.  Eye irritation.  Delivery of too much medicine or not enough medicine.  Mouth irritation. Supplies needed:  Air compressor (nebulizer machine).  Nebulizer medicine cup (reservoir)and tubing.  Mouthpiece or face mask.  Soap and water.  Sterile or distilled water.  Clean towel. How to use a nebulizer Preparing a nebulizer Take these steps before using your nebulizer: 1. Read the manufacturer's instructions for your nebulizer, as machines vary. 2. Check your medicine. Make sure it has not expired and is not damaged in any way. 3. Wash your hands with soap and water. 4. Put all  of the parts of your nebulizer on a sturdy, flat surface. 5. Connect the tubing to the nebulizer machine and to the reservoir. 6. Measure the liquid medicine according to instructions from your health care provider. Pour the liquid into the reservoir. 7. Attach the mouthpiece or mask. 8. Test the nebulizer by turning it on to make sure that a spray  comes out. Then, turn it off. Using a nebulizer Be sure to stop the machine at any time if you start coughing or if the medicine foams or bubbles. 1. Sit in an upright, relaxed position. 2. If your nebulizer has a mask, put it over your nose and mouth. It should fit somewhat snugly, with no gaps around the nose or cheeks where medicine could escape. If you use a mouthpiece, put it in your mouth. Press your lips firmly around the mouthpiece. 3. Turn on the nebulizer. 4. Some nebulizers have a finger valve. If yours does, cover up the air hole so the air gets to the nebulizer. 5. Once the medicine begins to mist out, take slow, deep breaths. If there is a finger valve, release it at the end of your breath. 6. Continue taking slow, deep breaths until the medicine in the nebulizer is gone and no mist appears.      Cleaning a nebulizer The nebulizer and all of its parts must be kept very clean. If the nebulizer and its parts are not cleaned properly, bacteria can grow inside of them. If you inhale the bacteria, you can get sick. Follow the manufacturer's instructions for cleaning your nebulizer. For most nebulizers, you should follow these guidelines:  Clean the mouthpiece or mask and the reservoir by: ? Rinsing them after each use. Use sterile or distilled water. ? Washing them 1-2 times a week using soap and warm water.  Do not wash the tubing.  After you rinse or wash them, place the parts on a clean towel and let them air-dry completely. After they dry, reconnect the pieces and turn the nebulizer on without any medicine in it. Doing this will blow air through the equipment to help dry it out.  Store the nebulizer in a clean and dust-free place.  Check the filter at least one time every week. Replace the filter if it looks dirty. Follow these instructions at home  Use your nebulizer only as told by your health care provider. Do not use the nebulizer more than directed by your health care  provider.  Do not use any products that contain nicotine or tobacco, such as cigarettes, e-cigarettes, and chewing tobacco. If you need help quitting, ask your health care provider.  Keep all follow-up visits as told by your health care provider. This is important. Where to find more information  Allergy & Asthma Network: allergyasthmanetwork.org  American Lung Association: www.lung.org Contact a health care provider if:  You have trouble using the nebulizer.  Your nebulizer foams or stops working.  Your nebulizer does not create a mist after you add medicine and turn it on. Get help right away if:  You continue to have trouble breathing.  Your breathing gets worse during a nebulizer treatment. These symptoms may represent a serious problem that is an emergency. Do not wait to see if the symptoms will go away. Get medical help right away. Call your local emergency services (911 in the [place] Do not drive yourself to the hospital. Summary  A nebulizer is a device that turns liquid medicine into a mist (vapor) that you  can breathe in (inhale).  Measure the liquid medicine according to instructions from your health care provider. Pour the liquid into the part of the nebulizer that holds the medicine (reservoir).  Once the medicine begins to mist out, take slow, deep breaths.  Rinse or wash the mouthpiece or mask and the reservoir after each use, and allow them to air-dry completely. This information is not intended to replace advice given to you by your health care provider. Make sure you discuss any questions you have with your health care provider. Document Revised: 07/27/2020 Document Reviewed: 12/25/2019 Elsevier Patient Education  2021 [place].    No orders of the defined types were placed in this encounter.   Meds ordered this encounter  Medications  . ipratropium-albuterol (DUONEB) 0.5-2.5 (3) MG/3ML SOLN    Sig: Take 3 mLs by nebulization every 2 (two) hours as  needed (wheeze, SOB).    Dispense:  60 mL    Refill:  99     See below for relevant physical exam findings  See below for recent lab and imaging results reviewed  Medications, allergies, PMH, PSH, SocH, FamH reviewed below    Follow-up instructions: Return if symptoms worsen or fail to improve, for SEE [place] SOONER IF NEEDED. CALL/MESSAGE W/ QUESTIONS.                                        Exam:  BP (!) 142/77 (BP Location: Left Arm, Patient Position: Sitting, Cuff Size: Normal)   Pulse 95   Temp 98.5 F (36.9 C) (Oral)   Wt 178 lb 0.6 oz (80.8 kg)   BMI 25.55 kg/m   Constitutional: VS see above. General Appearance: alert, well-developed, well-nourished, NAD  Neck: No masses, trachea midline.   Respiratory: Normal respiratory effort. no wheeze, no rhonchi, no rales.  Diminished breath sounds bilaterally in all lung fields.  Cardiovascular: S1/S2 normal, no murmur, no rub/gallop auscultated. RRR.   Musculoskeletal: Gait normal. Symmetric and independent movement of all extremities  Neurological: Normal balance/coordination. No tremor.  Skin: warm, dry, intact.   Psychiatric: Normal judgment/insight. Normal mood and affect. Oriented x3.   Current Meds  Medication Sig  . albuterol (PROVENTIL HFA;VENTOLIN HFA) 108 (90 Base) MCG/ACT inhaler Inhale into the lungs every 6 (six) hours as needed.  Marland Kitchen aspirin 325 MG tablet Take 325 mg by mouth daily.  Marland Kitchen atorvastatin (LIPITOR) 40 MG tablet Take 1 tablet (40 mg total) by mouth daily.  Marland Kitchen azelastine (ASTELIN) 0.1 % nasal spray Place 2 sprays into both nostrils 2 (two) times daily. Use in each nostril as needed for sinus congestion  . diclofenac Sodium (VOLTAREN) 1 % GEL Apply 4 g topically 4 (four) times daily. To affected area of aches/pain especially to back of neck.  Marland Kitchen ipratropium-albuterol (DUONEB) 0.5-2.5 (3) MG/3ML SOLN Take 3 mLs by nebulization every 2 (two) hours as needed (wheeze, SOB).  .  mometasone (ASMANEX) 220 MCG/INH inhaler Inhale 2 puffs into the lungs daily.  . promethazine (PHENERGAN) 25 MG tablet Take 1 tablet (25 mg total) by mouth every 8 (eight) hours as needed for nausea or vomiting.  . Tiotropium Bromide-Olodaterol 2.5-2.5 MCG/ACT AERS Inhale 2 puffs into the lungs daily.  . valsartan-hydrochlorothiazide (DIOVAN HCT) 80-12.5 MG tablet Take 2 tablets by mouth daily.    Allergies  Allergen Reactions  . Bupropion Nausea And Vomiting    Patient Active Problem List  Diagnosis Date Noted  . Intertrigo of genitocrural region due to Candida species 09/11/2020  . Laceration of scalp without foreign body 09/11/2020  . Nasal congestion 10/23/2019  . Tubular adenoma of colon 08/30/2018  . Diverticula of colon 08/30/2018  . Dyslipidemia 10/31/2016  . Coronary artery disease with history of myocardial infarction without history of CABG 06/14/2016  . Crohn's ileitis ([place]) 09/15/2015  . Coronary artery disease involving native coronary artery of native heart without angina pectoris 03/30/2015  . S/P coronary artery stent placement 03/30/2015  . History of intussusception 03/16/2015  . Smoking 11/11/2010  . COPD (chronic obstructive pulmonary disease) with chronic bronchitis ([place]) 11/11/2010    Family History  Problem Relation Age of Onset  . Hypertension Father   . Heart disease Father     Social History   Tobacco Use  Smoking Status Current Every Day Smoker  . Packs/day: 1.75  . Types: Cigarettes  Smokeless Tobacco Never Used    Past Surgical History:  Procedure Laterality Date  . CERVICAL FUSION    . CORONARY ANGIOPLASTY WITH STENT PLACEMENT      Immunization History  Administered Date(s) Administered  . PFIZER(Purple Top)SARS-COV-2 Vaccination 01/17/2020, 02/07/2020  . Pneumococcal Conjugate-13 10/17/2018    No results found for this or any previous visit (from the past 2160 hour(s)).  No results found.     All questions at time of visit  were answered - patient instructed to contact office with any additional concerns or updates. ER/RTC precautions were reviewed with the patient as applicable.   Please note: manual typing as well as voice recognition software may have been used to produce this document - typos may escape review. Please contact Dr. Sheppard Coil for any needed clarifications.

## 2021-03-04 NOTE — Telephone Encounter (Signed)
Task completed. Spoke to patient's daughter (per pt's request). He was unaware of the findings. Agreeable with provider's plan. Pt would like for provider to send a referral to the GI specialist.

## 2021-03-04 NOTE — Telephone Encounter (Signed)
Please call patient: I was going over his ER records again, on the CT it did show some thickening of the esophagus, this is the tube going from the mouth to the stomach.  If he is having chest or stomach pain, we might need to get him into GI to take a closer look at the esophagus, he may need a scope procedure done.  Please call him to ask if the ER made him aware of this finding, if he has a GI doctor he would like me to send him to for a checkup on this?  Thanks!

## 2021-03-05 NOTE — Telephone Encounter (Signed)
Transition Care Management Follow-up Telephone Call  Date of discharge and from where: [facility] 02/28/21  How have you been since you were released from the hospital? Doing ok. Patient had follow up on 03/03/21 with PCP.  Any questions or concerns? No  Items Reviewed:  Did the pt receive and understand the discharge instructions provided? Yes   Medications obtained and verified? Medications were verified at the follow up.  Other? No   Any new allergies since your discharge? Allergies were verified at the follow up.  Dietary orders reviewed? N/a  Do you have support at home? Yes   Functional Questionnaire: (I = Independent and D = Dependent) ADLs: I  Bathing/Dressing- I  Meal Prep- I  Eating- I  Maintaining continence- I  Transferring/Ambulation- I  Managing Meds- I  Follow up appointments reviewed:   PCP Hospital f/u appt confirmed? Yes  Scheduled to see Dr. Sheppard Coil on 03/03/21 @ 1400.  Are transportation arrangements needed? No   If their condition worsens, is the pt aware to call PCP or go to the Emergency Dept.? Yes  Was the patient provided with contact information for the PCP's office or ED? Yes  Was to pt encouraged to call back with questions or concerns? Yes

## 2021-03-15 ENCOUNTER — Telehealth: Payer: Self-pay | Admitting: General Practice

## 2021-03-15 NOTE — Telephone Encounter (Signed)
Transition Care Management Follow-up Telephone Call  Date of discharge and from where: [facility]  03/13/21  How have you been since you were released from the hospital? Patient stated "Not doing so good"  Any questions or concerns? No  Items Reviewed:  Did the pt receive and understand the discharge instructions provided? Yes   Medications obtained and verified? No Patient stated he wasn't sure about the medications.  Other? No   Any new allergies since your discharge? No   Dietary orders reviewed? Yes  Do you have support at home? Yes   Home Care and Equipment/Supplies: Were home health services ordered? no   Functional Questionnaire: (I = Independent and D = Dependent) ADLs: I  Bathing/Dressing- I  Meal Prep- I  Eating- I  Maintaining continence- I  Transferring/Ambulation- I  Managing Meds- I  Follow up appointments reviewed:   PCP Hospital f/u appt confirmed? Yes  Scheduled to see Caleen Jobs, NP on 03/16/21 @ 1400.  Are transportation arrangements needed? No   If their condition worsens, is the pt aware to call PCP or go to the Emergency Dept.? Yes  Was the patient provided with contact information for the PCP's office or ED? Yes  Was to pt encouraged to call back with questions or concerns? Yes

## 2021-03-16 ENCOUNTER — Other Ambulatory Visit: Payer: Self-pay

## 2021-03-16 ENCOUNTER — Ambulatory Visit (INDEPENDENT_AMBULATORY_CARE_PROVIDER_SITE_OTHER): Payer: Medicare Other | Admitting: Family Medicine

## 2021-03-16 ENCOUNTER — Encounter: Payer: Self-pay | Admitting: Family Medicine

## 2021-03-16 VITALS — BP 130/70 | HR 90 | Wt 179.9 lb

## 2021-03-16 DIAGNOSIS — F419 Anxiety disorder, unspecified: Secondary | ICD-10-CM

## 2021-03-16 DIAGNOSIS — E871 Hypo-osmolality and hyponatremia: Secondary | ICD-10-CM | POA: Diagnosis not present

## 2021-03-16 DIAGNOSIS — I499 Cardiac arrhythmia, unspecified: Secondary | ICD-10-CM | POA: Diagnosis not present

## 2021-03-16 DIAGNOSIS — F32A Depression, unspecified: Secondary | ICD-10-CM | POA: Diagnosis not present

## 2021-03-16 MED ORDER — SERTRALINE HCL 25 MG PO TABS: 25.0000 mg | ORAL_TABLET | Freq: Every day | ORAL | 3 refills | Status: DC

## 2021-03-16 NOTE — Progress Notes (Signed)
Acute Office Visit  Subjective:    Patient ID: David Woodard, male    DOB: 10-15-1951, 70 y.o.   MRN: [id number]  CC: hospital follow-up  HPI Patient is in today for hospital f/u.  Patient was seen at the emergency department on April 16 for headache and palpitations.  He had a negative work-up, apart from sodium being low and creatinine being slightly elevated.  The ED provider thought this was possibly related to dehydration and anxiety and depression related to his girlfriend recently passing since he has had multiple similar ED visits recently.  Due to his low sodium, ED provider told him to hold his blood pressure medication until he saw [place].  However he did not feel comfortable doing that, so he has been taking his regular medicines.  We can recheck sodium today.  Additionally at previous ED visit in early April, he was there for palpitations and EKG noted A. fib.  Patient was not aware of this.  We will recheck EKG today.  He is no longer having any physical symptoms.  He denies chest pain, shortness of breath, dizziness, headache, lightheadedness, GI upset, falls or fevers.  Reports chronic nausea without vomiting.  He has had a poor appetite, but he is trying to stay hydrated and eat at mealtimes.  He continues to smoke about a half a pack per day.  When asked about anxiety/depression related to girlfriend passing, patient became tearful stating he is struggling in this area.  PHQ 9 and GAD done today listed below.  He is not interested in any form of counseling, but he is willing to start medication -he has never been on anything before.  Reports he has a good support system.  Denies suicidal or homicidal ideation.  [place] Office Visit from 03/16/2021 in [place] Office Visit from 05/19/2020 in [place] Office Visit from 03/17/2020 in [place]  Thoughts that you would  be better off dead, or of hurting yourself in some way Not at all Not at all Not at all  PHQ-9 Total Score 13 4 2      GAD 7 : Generalized Anxiety Score 03/16/2021 05/19/2020 03/17/2020 02/17/2020  Nervous, Anxious, on Edge 2 0 0 1  Control/stop worrying 3 0 1 0  Worry too much - different things 1 0 0 1  Trouble relaxing 1 0 0 1  Restless 0 0 0 1  Easily annoyed or irritable 0 1 0 0  Afraid - awful might happen 0 0 0 0  Total GAD 7 Score 7 1 1 4   Anxiety Difficulty Somewhat difficult - Somewhat difficult -      Past Medical History:  Diagnosis Date  . COPD with acute bronchitis ([place]) 11/11/2010   Qualifier: Diagnosis of  By: Joya Gaskins MD, Burnett Harry   . Crohn's ileitis ([place]) 09/15/2015   Overview:  GI   . Diverticula of colon 08/30/2018   [facility]  . MI (myocardial infarction) ([place])   . Tubular adenoma of colon 08/30/2018   [facility]    Past Surgical History:  Procedure Laterality Date  . CERVICAL FUSION    . CORONARY ANGIOPLASTY WITH STENT PLACEMENT      Family History  Problem Relation Age of Onset  . Hypertension Father   . Heart disease Father     Social History   Socioeconomic History  .  Marital status: Single    Spouse name: Not on file  . Number of children: Not on file  . Years of education: Not on file  . Highest education level: Not on file  Occupational History  . Not on file  Tobacco Use  . Smoking status: Current Every Day Smoker    Packs/day: 1.75    Types: Cigarettes  . Smokeless tobacco: Never Used  Substance and Sexual Activity  . Alcohol use: Yes  . Drug use: No  . Sexual activity: Not on file  Other Topics Concern  . Not on file  Social History Narrative  . Not on file   Social Determinants of Health   Financial Resource Strain: Not on file  Food Insecurity: Not on file  Transportation Needs: Not on file  Physical Activity: Not on file  Stress: Not on  file  Social Connections: Not on file  Intimate Partner Violence: Not on file    Outpatient Medications Prior to Visit  Medication Sig Dispense Refill  . albuterol (PROVENTIL HFA;VENTOLIN HFA) 108 (90 Base) MCG/ACT inhaler Inhale into the lungs every 6 (six) hours as needed.    Marland Kitchen aspirin 325 MG tablet Take 325 mg by mouth daily.    Marland Kitchen atorvastatin (LIPITOR) 40 MG tablet Take 1 tablet (40 mg total) by mouth daily. 90 tablet 3  . azelastine (ASTELIN) 0.1 % nasal spray Place 2 sprays into both nostrils 2 (two) times daily. Use in each nostril as needed for sinus congestion    . diclofenac Sodium (VOLTAREN) 1 % GEL Apply 4 g topically 4 (four) times daily. To affected area of aches/pain especially to back of neck. 150 g 11  . ipratropium-albuterol (DUONEB) 0.5-2.5 (3) MG/3ML SOLN Take 3 mLs by nebulization every 2 (two) hours as needed (wheeze, SOB). 60 mL 99  . mometasone (ASMANEX) 220 MCG/INH inhaler Inhale 2 puffs into the lungs daily.    . promethazine (PHENERGAN) 25 MG tablet Take 1 tablet (25 mg total) by mouth every 8 (eight) hours as needed for nausea or vomiting. 30 tablet 3  . Tiotropium Bromide-Olodaterol 2.5-2.5 MCG/ACT AERS Inhale 2 puffs into the lungs daily. 12 g 3  . valsartan-hydrochlorothiazide (DIOVAN HCT) 80-12.5 MG tablet Take 2 tablets by mouth daily. 90 tablet 0   No facility-administered medications prior to visit.    Allergies  Allergen Reactions  . Bupropion Nausea And Vomiting    Review of Systems All review of systems negative except what is listed in the HPI     Objective:    Physical Exam Vitals reviewed.  Constitutional:      Appearance: Normal appearance. He is normal weight.  HENT:     Head: Normocephalic and atraumatic.  Cardiovascular:     Rate and Rhythm: Normal rate. Rhythm irregular.     Pulses: Normal pulses.     Heart sounds: No murmur heard.   Pulmonary:     Effort: Pulmonary effort is normal.     Breath sounds: Normal breath sounds.   Abdominal:     Palpations: Abdomen is soft.  Skin:    Capillary Refill: Capillary refill takes less than 2 seconds.  Neurological:     General: No focal deficit present.     Mental Status: He is alert and oriented to person, place, and time. Mental status is at baseline.  Psychiatric:        Mood and Affect: Mood normal.        Behavior: Behavior normal.  Thought Content: Thought content normal.        Judgment: Judgment normal.     There were no vitals taken for this visit. Wt Readings from Last 3 Encounters:  03/03/21 178 lb 0.6 oz (80.8 kg)  02/24/21 178 lb (80.7 kg)  02/17/21 178 lb 1.9 oz (80.8 kg)    Health Maintenance Due  Topic Date Due  . Hepatitis C Screening  Never done  . COVID-19 Vaccine (3 - Booster for Pfizer series) 08/09/2020    There are no preventive care reminders to display for this patient.   Lab Results  Component Value Date   TSH 1.37 10/13/2020   Lab Results  Component Value Date   WBC 10.2 09/09/2019   HGB 15.6 09/09/2019   HCT 47.1 09/09/2019   MCV 94.8 09/09/2019   PLT 316 09/09/2019   Lab Results  Component Value Date   NA 136 10/13/2020   K 4.6 10/13/2020   CO2 29 10/13/2020   GLUCOSE 90 10/13/2020   BUN 17 10/13/2020   CREATININE 1.87 (H) 10/13/2020   BILITOT 0.5 10/13/2020   ALKPHOS 61 05/21/2017   AST 9 (L) 10/13/2020   ALT 8 (L) 10/13/2020   PROT 6.8 10/13/2020   CALCIUM 10.5 (H) 10/13/2020   No results found for: CHOL No results found for: HDL No results found for: LDLCALC No results found for: TRIG No results found for: CHOLHDL No results found for: HGBA1C     Assessment & Plan:  1. Low sodium levels ED provider recommended patient hold his valsartan-HCTZ due to low sodium and have it rechecked here.  However patient has continued taking medication - will recheck CMP today.  BP is well controlled today.  Will update patient with results and plan. - COMPLETE METABOLIC PANEL WITH GFR  2. Irregular heart  beats Early April ED visit with EKG showing A. fib RVR at 105.  I did hear an extra beat or 2 at visit today and decided to recheck an EKG.  It showed sinus rhythm with PACs.  Patient is asymptomatic.  Patient aware of any signs or symptoms that would require further evaluation. - EKG 12-Lead  3. Anxiety and depression As seen in his PHQ-9 and GAD-7 scores patient is experiencing anxiety and depression likely related to recently losing his girlfriend.  We discussed options and patient is not interested in counseling at this time.  States he has a good support system.  He does want to try to see if medication will be helpful for him.  We discussed options and are going to start low-dose Zoloft.  Would like him to follow-up with PCP in about 4-5 weeks to check for any improvement or medication side effects. - sertraline (ZOLOFT) 25 MG tablet; Take 1 tablet (25 mg total) by mouth daily.  Dispense: 30 tablet; Refill: 3   Follow-up with PCP in about 4 weeks.  Purcell Nails Olevia Bowens, DNP, FNP-C

## 2021-03-17 LAB — COMPLETE METABOLIC PANEL WITH GFR
AG Ratio: 1.5 (calc) (ref 1.0–2.5)
ALT: 17 U/L (ref 9–46)
AST: 17 U/L (ref 10–35)
Albumin: 4.1 g/dL (ref 3.6–5.1)
Alkaline phosphatase (APISO): 74 U/L (ref 35–144)
BUN/Creatinine Ratio: 11 (calc) (ref 6–22)
BUN: 17 mg/dL (ref 7–25)
CO2: 29 mmol/L (ref 20–32)
Calcium: 9.2 mg/dL (ref 8.6–10.3)
Chloride: 88 mmol/L — ABNORMAL LOW (ref 98–110)
Creat: 1.54 mg/dL — ABNORMAL HIGH (ref 0.70–1.18)
GFR, Est African American: 52 mL/min/{1.73_m2} — ABNORMAL LOW (ref >=60)
GFR, Est Non African American: 45 mL/min/{1.73_m2} — ABNORMAL LOW (ref >=60)
Globulin: 2.7 g/dL (calc) (ref 1.9–3.7)
Glucose, Bld: 99 mg/dL (ref 65–99)
Potassium: 4.1 mmol/L (ref 3.5–5.3)
Sodium: 125 mmol/L — ABNORMAL LOW (ref 135–146)
Total Bilirubin: 0.4 mg/dL (ref 0.2–1.2)
Total Protein: 6.8 g/dL (ref 6.1–8.1)

## 2021-03-17 NOTE — Addendum Note (Signed)
Addended by: Caleen Jobs B on: 03/17/2021 12:51 PM   Modules accepted: Orders

## 2021-03-17 NOTE — Progress Notes (Signed)
Your electrolytes (sodium and chloride) are still low. I want you to skip your Diovan (blood pressure combo pill) for 2 days and come to our lab on Friday morning for [place] to recheck your levels. I will also like them to get a urine sample to check for dehydration or other causes of low sodium. During the next few days, try to avoid excess amounts of water and instead drink something with electrolytes like Gatorade or Pedialyte. I know you haven't had much appetite lately, but try to eat a balanced diet and add some salt for these next couple days. If you start having any severe symptoms please go to the ED (such as headaches, lethargy, confusion, dizziness, severe muscle cramps or new weakness, trouble breathing, chest pain, etc).

## 2021-03-19 ENCOUNTER — Other Ambulatory Visit: Payer: Self-pay | Admitting: *Deleted

## 2021-03-19 DIAGNOSIS — E871 Hypo-osmolality and hyponatremia: Secondary | ICD-10-CM

## 2021-03-20 LAB — SODIUM, URINE, RANDOM: Sodium, Ur: 17 mmol/L — ABNORMAL LOW (ref 28–272)

## 2021-03-20 LAB — BASIC METABOLIC PANEL WITH GFR
BUN/Creatinine Ratio: 12 (calc) (ref 6–22)
BUN: 19 mg/dL (ref 7–25)
CO2: 29 mmol/L (ref 20–32)
Calcium: 9.2 mg/dL (ref 8.6–10.3)
Chloride: 92 mmol/L — ABNORMAL LOW (ref 98–110)
Creat: 1.54 mg/dL — ABNORMAL HIGH (ref 0.70–1.18)
GFR, Est African American: 52 mL/min/{1.73_m2} — ABNORMAL LOW (ref >=60)
GFR, Est Non African American: 45 mL/min/{1.73_m2} — ABNORMAL LOW (ref >=60)
Glucose, Bld: 100 mg/dL (ref 65–139)
Potassium: 4.1 mmol/L (ref 3.5–5.3)
Sodium: 128 mmol/L — ABNORMAL LOW (ref 135–146)

## 2021-03-21 LAB — OSMOLALITY, URINE: Osmolality, Ur: 246 mOsm/kg (ref 50–1200)

## 2021-03-22 NOTE — Progress Notes (Signed)
Sodium is slowly improving. Continue what you have been doing as far as staying hydrated with electrolyte-drinks and eating some sodium. Schedule an appointment for later this week so we can recheck labs and blood pressure.

## 2021-04-02 ENCOUNTER — Other Ambulatory Visit: Payer: Self-pay

## 2021-04-02 ENCOUNTER — Ambulatory Visit (INDEPENDENT_AMBULATORY_CARE_PROVIDER_SITE_OTHER): Payer: Medicare (Managed Care) | Admitting: Family Medicine

## 2021-04-02 ENCOUNTER — Encounter: Payer: Self-pay | Admitting: Family Medicine

## 2021-04-02 ENCOUNTER — Telehealth: Payer: Self-pay | Admitting: General Practice

## 2021-04-02 VITALS — BP 148/74 | HR 103 | Ht 70.0 in | Wt 181.0 lb

## 2021-04-02 DIAGNOSIS — J441 Chronic obstructive pulmonary disease with (acute) exacerbation: Secondary | ICD-10-CM

## 2021-04-02 DIAGNOSIS — I10 Essential (primary) hypertension: Secondary | ICD-10-CM

## 2021-04-02 NOTE — Telephone Encounter (Signed)
Transition Care Management Follow-up Telephone Call  Date of discharge and from where: 04/02/21 [facility]  How have you been since you were released from the hospital? Still not doing better but he was discharged home and was told it was a COPD exacerbation.   Any questions or concerns? No  Items Reviewed:  Did the pt receive and understand the discharge instructions provided? Yes   Medications obtained and verified? Yes   Other? No   Any new allergies since your discharge? No   Dietary orders reviewed? Yes  Do you have support at home? Yes   Home Care and Equipment/Supplies: Were home health services ordered? no  Functional Questionnaire: (I = Independent and D = Dependent) ADLs: I  Bathing/Dressing- I  Meal Prep- I  Eating- I  Maintaining continence- I  Transferring/Ambulation- I  Managing Meds- I  Follow up appointments reviewed:   PCP Hospital f/u appt confirmed? Yes  Scheduled to see Dr. Sheppard Coil on 04/13/21 @ 1340.  [facility] f/u appt confirmed? No    Are transportation arrangements needed? No   If their condition worsens, is the pt aware to call PCP or go to the Emergency Dept.? Yes  Was the patient provided with contact information for the PCP's office or ED? Yes  Was to pt encouraged to call back with questions or concerns? Yes

## 2021-04-02 NOTE — Progress Notes (Signed)
Acute Office Visit  Subjective:    Patient ID: David Woodard, male    DOB: 08/28/1951, 70 y.o.   MRN: [id number]  Chief Complaint  Patient presents with  . Hospitalization Follow-up    HPI Patient is in today for hospital follow-up.   Reports he was in the ED last night from about 0300-0700 this morning. He states that he was having significant shortness of breath which triggered some anxiety. States he had been feeling bad for the past 3 days or so with increasing shortness of breath. He says the last year in general has been rough for him stating "this COPD has been kicking my butt." He is still a smoker, but trying to cut back; currently smoking a pack a day.  BP was elevated in the hospital and initially at this visit, though patient states it had been doing good for awhile. He is currently taking amlodpine-hctz 80-12.5 mg daily. Was taking 2 tabs per day at one point, but cut back to one after sodium kept dropping. Sodium back up to 135 at ED visit.   ED workup negative and patient was diagnosed with COPD exacerbation and started on doxycycline and prednisone.   He is scheduled for follow-up with Dr. Sheppard Coil later this month, then planning to move back to [place] to be closer to family, since his girlfriend recently passed. - I think this will be very beneficial for him to be around family as he seems to have some anxiety factors as well.    Past Medical History:  Diagnosis Date  . COPD with acute bronchitis ([place]) 11/11/2010   Qualifier: Diagnosis of  By: Joya Gaskins MD, Burnett Harry   . Crohn's ileitis ([place]) 09/15/2015   Overview:  GI   . Diverticula of colon 08/30/2018   [facility]  . MI (myocardial infarction) ([place])   . Tubular adenoma of colon 08/30/2018   [facility]    Past Surgical History:  Procedure Laterality Date  . CERVICAL FUSION    . CORONARY ANGIOPLASTY WITH STENT PLACEMENT       Family History  Problem Relation Age of Onset  . Hypertension Father   . Heart disease Father     Social History   Socioeconomic History  . Marital status: Single    Spouse name: Not on file  . Number of children: Not on file  . Years of education: Not on file  . Highest education level: Not on file  Occupational History  . Not on file  Tobacco Use  . Smoking status: Current Every Day Smoker    Packs/day: 1.75    Types: Cigarettes  . Smokeless tobacco: Never Used  Substance and Sexual Activity  . Alcohol use: Yes  . Drug use: No  . Sexual activity: Not on file  Other Topics Concern  . Not on file  Social History Narrative  . Not on file   Social Determinants of Health   Financial Resource Strain: Not on file  Food Insecurity: Not on file  Transportation Needs: Not on file  Physical Activity: Not on file  Stress: Not on file  Social Connections: Not on file  Intimate Partner Violence: Not on file    Outpatient Medications Prior to Visit  Medication Sig Dispense Refill  . albuterol (PROVENTIL HFA;VENTOLIN HFA) 108 (90 Base) MCG/ACT inhaler Inhale into the lungs every 6 (six) hours as needed.    Marland Kitchen aspirin 325 MG tablet Take 325 mg  by mouth daily.    Marland Kitchen atorvastatin (LIPITOR) 40 MG tablet Take 1 tablet (40 mg total) by mouth daily. 90 tablet 3  . azelastine (ASTELIN) 0.1 % nasal spray Place 2 sprays into both nostrils 2 (two) times daily. Use in each nostril as needed for sinus congestion    . diclofenac Sodium (VOLTAREN) 1 % GEL Apply 4 g topically 4 (four) times daily. To affected area of aches/pain especially to back of neck. 150 g 11  . doxycycline (VIBRAMYCIN) 100 MG capsule Take by mouth.    Marland Kitchen ipratropium-albuterol (DUONEB) 0.5-2.5 (3) MG/3ML SOLN Take 3 mLs by nebulization every 2 (two) hours as needed (wheeze, SOB). 60 mL 99  . mometasone (ASMANEX) 220 MCG/INH inhaler Inhale 2 puffs into the lungs daily.    . predniSONE (DELTASONE) 20 MG tablet Take by  mouth.    . promethazine (PHENERGAN) 25 MG tablet Take 1 tablet (25 mg total) by mouth every 8 (eight) hours as needed for nausea or vomiting. 30 tablet 3  . sertraline (ZOLOFT) 25 MG tablet Take 1 tablet (25 mg total) by mouth daily. 30 tablet 3  . Tiotropium Bromide-Olodaterol 2.5-2.5 MCG/ACT AERS Inhale 2 puffs into the lungs daily. 12 g 3  . valsartan-hydrochlorothiazide (DIOVAN HCT) 80-12.5 MG tablet Take 2 tablets by mouth daily. 90 tablet 0   No facility-administered medications prior to visit.    Allergies  Allergen Reactions  . Bupropion Nausea And Vomiting    Review of Systems All review of systems negative except what is listed in the HPI      Objective:    Physical Exam Vitals reviewed.  Constitutional:      Appearance: Normal appearance. He is normal weight.  HENT:     Head: Normocephalic and atraumatic.  Cardiovascular:     Rate and Rhythm: Normal rate and regular rhythm.     Heart sounds: Normal heart sounds.  Pulmonary:     Effort: Pulmonary effort is normal.     Breath sounds: Normal breath sounds. No wheezing, rhonchi or rales.  Musculoskeletal:        General: Normal range of motion.  Neurological:     General: No focal deficit present.     Mental Status: He is alert and oriented to person, place, and time. Mental status is at baseline.  Psychiatric:        Mood and Affect: Mood normal.        Behavior: Behavior normal.        Thought Content: Thought content normal.        Judgment: Judgment normal.     BP (!) 148/74   Pulse (!) 103   Ht 5' 10"  (1.778 m)   Wt 181 lb (82.1 kg)   SpO2 95%   BMI 25.97 kg/m  Wt Readings from Last 3 Encounters:  04/02/21 181 lb (82.1 kg)  03/16/21 179 lb 14 oz (81.6 kg)  03/03/21 178 lb 0.6 oz (80.8 kg)    Health Maintenance Due  Topic Date Due  . Hepatitis C Screening  Never done  . COVID-19 Vaccine (3 - Booster for Pfizer series) 08/09/2020    There are no preventive care reminders to display for this  patient.   Lab Results  Component Value Date   TSH 1.37 10/13/2020   Lab Results  Component Value Date   WBC 10.2 09/09/2019   HGB 15.6 09/09/2019   HCT 47.1 09/09/2019   MCV 94.8 09/09/2019   PLT 316 09/09/2019  Lab Results  Component Value Date   NA 128 (L) 03/19/2021   K 4.1 03/19/2021   CO2 29 03/19/2021   GLUCOSE 100 03/19/2021   BUN 19 03/19/2021   CREATININE 1.54 (H) 03/19/2021   BILITOT 0.4 03/16/2021   ALKPHOS 61 05/21/2017   AST 17 03/16/2021   ALT 17 03/16/2021   PROT 6.8 03/16/2021   CALCIUM 9.2 03/19/2021   No results found for: CHOL No results found for: HDL No results found for: LDLCALC No results found for: TRIG No results found for: CHOLHDL No results found for: HGBA1C     Assessment & Plan:   1. COPD exacerbation ([place]) Patient given doxy and prednisone from ED. Okay to continue. Recommend he use his inhalers as prescribed and try to rest, and stay hydrated. He is scheduled to see Dr. Sheppard Coil for f/u on 04/13/21 - if needed, she can recheck labs. ED labs/imaging not alarming from earlier today.  2. Essential hypertension BP improved upon recheck. Recommend he continue his medications as prescribed and try to check his BP occasionally either at home or in pharmacy/etc. If he remains elevated, may need to go back on higher dose of amlodipine-hctz.   Patient aware of signs and symptoms requiring urgent evaluation. Follow-up as scheduled with PCP or sooner if needed.   Terrilyn Saver, NP

## 2021-04-12 ENCOUNTER — Telehealth: Payer: Self-pay | Admitting: General Practice

## 2021-04-12 NOTE — Telephone Encounter (Signed)
Transition Care Management Follow-up Telephone Call  Date of discharge and from where: [facility] 04/10/21  How have you been since you were released from the hospital? Doing a little better; has been taking his meclizine.  Any questions or concerns? No  Items Reviewed:  Did the pt receive and understand the discharge instructions provided? Yes   Medications obtained and verified? Yes   Other? No   Any new allergies since your discharge? No   Dietary orders reviewed? No  Do you have support at home? Yes   Home Care and Equipment/Supplies: Were home health services ordered? no  Functional Questionnaire: (I = Independent and D = Dependent) ADLs: I  Bathing/Dressing- I  Meal Prep- I  Eating- I  Maintaining continence- I  Transferring/Ambulation- I  Managing Meds- I  Follow up appointments reviewed:   PCP Hospital f/u appt confirmed? Yes  Scheduled to see Dr. Sheppard Coil on 04/13/21 @ 1340.  [facility] f/u appt confirmed? No  If their condition worsens, is the pt aware to call PCP or go to the Emergency Dept.? Yes  Was the patient provided with contact information for the PCP's office or ED? Yes  Was to pt encouraged to call back with questions or concerns? Yes

## 2021-04-13 ENCOUNTER — Other Ambulatory Visit: Payer: Self-pay

## 2021-04-13 ENCOUNTER — Ambulatory Visit (INDEPENDENT_AMBULATORY_CARE_PROVIDER_SITE_OTHER): Payer: Medicare (Managed Care) | Admitting: Osteopathic Medicine

## 2021-04-13 ENCOUNTER — Encounter: Payer: Self-pay | Admitting: Osteopathic Medicine

## 2021-04-13 VITALS — BP 158/90 | HR 88 | Temp 98.3°F | Wt 181.0 lb

## 2021-04-13 DIAGNOSIS — I10 Essential (primary) hypertension: Secondary | ICD-10-CM | POA: Diagnosis not present

## 2021-04-13 DIAGNOSIS — I2510 Atherosclerotic heart disease of native coronary artery without angina pectoris: Secondary | ICD-10-CM

## 2021-04-13 DIAGNOSIS — E785 Hyperlipidemia, unspecified: Secondary | ICD-10-CM

## 2021-04-13 DIAGNOSIS — K5730 Diverticulosis of large intestine without perforation or abscess without bleeding: Secondary | ICD-10-CM

## 2021-04-13 DIAGNOSIS — J449 Chronic obstructive pulmonary disease, unspecified: Secondary | ICD-10-CM | POA: Diagnosis not present

## 2021-04-13 DIAGNOSIS — F419 Anxiety disorder, unspecified: Secondary | ICD-10-CM

## 2021-04-13 DIAGNOSIS — I252 Old myocardial infarction: Secondary | ICD-10-CM

## 2021-04-13 DIAGNOSIS — Z955 Presence of coronary angioplasty implant and graft: Secondary | ICD-10-CM

## 2021-04-13 DIAGNOSIS — K50018 Crohn's disease of small intestine with other complication: Secondary | ICD-10-CM

## 2021-04-13 DIAGNOSIS — E871 Hypo-osmolality and hyponatremia: Secondary | ICD-10-CM | POA: Diagnosis not present

## 2021-04-13 DIAGNOSIS — F32A Depression, unspecified: Secondary | ICD-10-CM

## 2021-04-13 MED ORDER — ATORVASTATIN CALCIUM 40 MG PO TABS: 40.0000 mg | ORAL_TABLET | Freq: Every day | ORAL | 1 refills | Status: AC

## 2021-04-13 MED ORDER — SERTRALINE HCL 25 MG PO TABS: 25.0000 mg | ORAL_TABLET | Freq: Every day | ORAL | 3 refills | Status: AC

## 2021-04-13 MED ORDER — MOMETASONE FUROATE 220 MCG/INH IN AEPB: 2.0000 | INHALATION_SPRAY | Freq: Every day | RESPIRATORY_TRACT | 1 refills | Status: AC

## 2021-04-13 MED ORDER — ASPIRIN 325 MG PO TABS: 325.0000 mg | ORAL_TABLET | Freq: Every day | ORAL | 1 refills | Status: AC

## 2021-04-13 MED ORDER — IPRATROPIUM-ALBUTEROL 0.5-2.5 (3) MG/3ML IN SOLN: 3.0000 mL | RESPIRATORY_TRACT | 1 refills | Status: AC | PRN

## 2021-04-13 MED ORDER — PROMETHAZINE HCL 25 MG PO TABS: 25.0000 mg | ORAL_TABLET | Freq: Three times a day (TID) | ORAL | 0 refills | Status: AC | PRN

## 2021-04-13 MED ORDER — VALSARTAN-HYDROCHLOROTHIAZIDE 160-25 MG PO TABS: 1.0000 | ORAL_TABLET | Freq: Every day | ORAL | 1 refills | Status: DC

## 2021-04-13 MED ORDER — PROMETHAZINE HCL 25 MG PO TABS: 25.0000 mg | ORAL_TABLET | Freq: Three times a day (TID) | ORAL | 0 refills | Status: DC | PRN

## 2021-04-13 MED ORDER — ANORO ELLIPTA 62.5-25 MCG/INH IN AEPB: 1.0000 | INHALATION_SPRAY | Freq: Every day | RESPIRATORY_TRACT | 3 refills | Status: AC

## 2021-04-13 MED ORDER — ALBUTEROL SULFATE HFA 108 (90 BASE) MCG/ACT IN AERS: 1.0000 | INHALATION_SPRAY | RESPIRATORY_TRACT | 1 refills | Status: AC | PRN

## 2021-04-13 NOTE — Patient Instructions (Signed)
Good luck in [place]!  Let us know if there's anything else you need. We are happy to send records to your new doctors up there.

## 2021-04-13 NOTE — Progress Notes (Signed)
David Woodard is a 70 y.o. male who presents to  [facility]  today, 04/13/21, seeking care for the following:  . Will be moving next week, needs medications printed. Will be moving back to [place], [place]. Reports overall ok except feeling nauseous around bedtime. Reviewed recent ER records (Still having SOB issues and he has low threshold for seeking ER care) 04/02/21 for COPD and 04/09/21 for vertigo/nausea. Requests refill on nausea medications.  . Sodium still on the lower side, was 133 in ER few days ago, 135 04/02/21. Needs w/u for SIADH when he establishes      ASSESSMENT & PLAN with other pertinent findings:  There were no encounter diagnoses.    There are no Patient Instructions on file for this visit.  No orders of the defined types were placed in this encounter.   No orders of the defined types were placed in this encounter.    See below for relevant physical exam findings  See below for recent lab and imaging results reviewed  Medications, allergies, PMH, PSH, SocH, FamH reviewed below    Follow-up instructions: No follow-ups on file.                                        Exam:  BP (!) 158/90 (BP Location: Left Arm, Patient Position: Sitting, Cuff Size: Normal)   Pulse 88   Temp 98.3 F (36.8 C) (Oral)   Wt 181 lb (82.1 kg)   BMI 25.97 kg/m   Constitutional: VS see above. General Appearance: alert, well-developed, well-nourished, NAD  Neck: No masses, trachea midline.   Respiratory: Normal respiratory effort. no wheeze, no rhonchi, no rales, diminshed breath sounds bilaterally   Cardiovascular: S1/S2 normal, no murmur, no rub/gallop auscultated. RRR.   Musculoskeletal: Gait normal.   Neurological: Normal balance/coordination. No tremor.  Skin: warm, dry, intact.   Psychiatric: Normal judgment/insight. Normal mood and affect. Oriented x3.   Current Meds   Medication Sig  . albuterol (PROVENTIL HFA;VENTOLIN HFA) 108 (90 Base) MCG/ACT inhaler Inhale into the lungs every 6 (six) hours as needed.  Marland Kitchen aspirin 325 MG tablet Take 325 mg by mouth daily.  Marland Kitchen atorvastatin (LIPITOR) 40 MG tablet Take 1 tablet (40 mg total) by mouth daily.  Marland Kitchen azelastine (ASTELIN) 0.1 % nasal spray Place 2 sprays into both nostrils 2 (two) times daily. Use in each nostril as needed for sinus congestion  . diclofenac Sodium (VOLTAREN) 1 % GEL Apply 4 g topically 4 (four) times daily. To affected area of aches/pain especially to back of neck.  Marland Kitchen ipratropium-albuterol (DUONEB) 0.5-2.5 (3) MG/3ML SOLN Take 3 mLs by nebulization every 2 (two) hours as needed (wheeze, SOB).  . mometasone (ASMANEX) 220 MCG/INH inhaler Inhale 2 puffs into the lungs daily.  . promethazine (PHENERGAN) 25 MG tablet Take 1 tablet (25 mg total) by mouth every 8 (eight) hours as needed for nausea or vomiting.  . sertraline (ZOLOFT) 25 MG tablet Take 1 tablet (25 mg total) by mouth daily.  . Tiotropium Bromide-Olodaterol 2.5-2.5 MCG/ACT AERS Inhale 2 puffs into the lungs daily.  . valsartan-hydrochlorothiazide (DIOVAN HCT) 80-12.5 MG tablet Take 2 tablets by mouth daily.    Allergies  Allergen Reactions  . Bupropion Nausea And Vomiting    Patient Active Problem List   Diagnosis Date Noted  . Intertrigo of genitocrural region due to  Candida species 09/11/2020  . Laceration of scalp without foreign body 09/11/2020  . Nasal congestion 10/23/2019  . Tubular adenoma of colon 08/30/2018  . Diverticula of colon 08/30/2018  . Dyslipidemia 10/31/2016  . Coronary artery disease with history of myocardial infarction without history of CABG 06/14/2016  . Crohn's ileitis ([place]) 09/15/2015  . Coronary artery disease involving native coronary artery of native heart without angina pectoris 03/30/2015  . S/P coronary artery stent placement 03/30/2015  . History of intussusception 03/16/2015  . Smoking 11/11/2010   . COPD (chronic obstructive pulmonary disease) with chronic bronchitis ([place]) 11/11/2010    Family History  Problem Relation Age of Onset  . Hypertension Father   . Heart disease Father     Social History   Tobacco Use  Smoking Status Former Smoker  . Packs/day: 1.75  . Types: Cigarettes  Smokeless Tobacco Never Used    Past Surgical History:  Procedure Laterality Date  . CERVICAL FUSION    . CORONARY ANGIOPLASTY WITH STENT PLACEMENT      Immunization History  Administered Date(s) Administered  . PFIZER(Purple Top)SARS-COV-2 Vaccination 01/17/2020, 02/07/2020  . Pneumococcal Conjugate-13 10/17/2018    Recent Results (from the past 2160 hour(s))  COMPLETE METABOLIC PANEL WITH GFR     Status: Abnormal   Collection Time: 03/16/21 12:00 AM  Result Value Ref Range   Glucose, Bld 99 65 - 99 mg/dL    Comment: .            Fasting reference interval .    BUN 17 7 - 25 mg/dL   Creat 1.54 (H) 0.70 - 1.18 mg/dL    Comment: For patients >13 years of age, the reference limit for Creatinine is approximately 13% higher for people identified as African-American. .    GFR, Est Non African American 45 (L) > OR = 60 mL/min/1.36m   GFR, Est African American 52 (L) > OR = 60 mL/min/1.720m  BUN/Creatinine Ratio 11 6 - 22 (calc)   Sodium 125 (L) 135 - 146 mmol/L   Potassium 4.1 3.5 - 5.3 mmol/L   Chloride 88 (L) 98 - 110 mmol/L   CO2 29 20 - 32 mmol/L   Calcium 9.2 8.6 - 10.3 mg/dL   Total Protein 6.8 6.1 - 8.1 g/dL   Albumin 4.1 3.6 - 5.1 g/dL   Globulin 2.7 1.9 - 3.7 g/dL (calc)   AG Ratio 1.5 1.0 - 2.5 (calc)   Total Bilirubin 0.4 0.2 - 1.2 mg/dL   Alkaline phosphatase (APISO) 74 35 - 144 U/L   AST 17 10 - 35 U/L   ALT 17 9 - 46 U/L  BASIC METABOLIC PANEL WITH GFR     Status: Abnormal   Collection Time: 03/19/21  9:14 AM  Result Value Ref Range   Glucose, Bld 100 65 - 139 mg/dL    Comment: .        Non-fasting reference interval .    BUN 19 7 - 25 mg/dL   Creat  1.54 (H) 0.70 - 1.18 mg/dL    Comment: For patients >4931ears of age, the reference limit for Creatinine is approximately 13% higher for people identified as African-American. .    GFR, Est Non African American 45 (L) > OR = 60 mL/min/1.7323m GFR, Est African American 52 (L) > OR = 60 mL/min/1.105m9mBUN/Creatinine Ratio 12 6 - 22 (calc)   Sodium 128 (L) 135 - 146 mmol/L   Potassium 4.1 3.5 - 5.3 mmol/L  Chloride 92 (L) 98 - 110 mmol/L   CO2 29 20 - 32 mmol/L   Calcium 9.2 8.6 - 10.3 mg/dL  Osmolality, urine     Status: None   Collection Time: 03/19/21  9:16 AM  Result Value Ref Range   Osmolality, Ur 246 50 - 1,200 mOsm/kg  Sodium, urine, random     Status: Abnormal   Collection Time: 03/19/21  9:18 AM  Result Value Ref Range   Sodium, Ur 17 (L) 28 - 272 mmol/L    No results found.     All questions at time of visit were answered - patient instructed to contact office with any additional concerns or updates. ER/RTC precautions were reviewed with the patient as applicable.   Please note: manual typing as well as voice recognition software may have been used to produce this document - typos may escape review. Please contact Dr. Sheppard Coil for any needed clarifications.

## 2021-04-19 ENCOUNTER — Telehealth: Payer: Self-pay | Admitting: General Practice

## 2021-04-19 NOTE — Telephone Encounter (Signed)
Transition Care Management Unsuccessful Follow-up Telephone Call  Date of discharge and from where:  [facility] 04/16/21  Attempts:  1st Attempt  Reason for unsuccessful TCM follow-up call:  Left voice message

## 2021-04-20 NOTE — Telephone Encounter (Signed)
Transition Care Management Unsuccessful Follow-up Telephone Call  Date of discharge and from where:  [facility] 04/16/21  Attempts:  2nd Attempt  Reason for unsuccessful TCM follow-up call:  Left voice message

## 2021-04-21 NOTE — Telephone Encounter (Signed)
Transition Care Management Unsuccessful Follow-up Telephone Call  Date of discharge and from where:  [facility] 04/16/21  Attempts:  3rd Attempt  Reason for unsuccessful TCM follow-up call:  No answer/busy

## 2021-04-27 ENCOUNTER — Telehealth: Payer: Self-pay | Admitting: General Practice

## 2021-04-27 NOTE — Telephone Encounter (Signed)
Transition Care Management Unsuccessful Follow-up Telephone Call  Date of discharge and from where:  [facility] 04/26/21  Attempts:  1st Attempt  Reason for unsuccessful TCM follow-up call:  Left voice message

## 2021-04-28 NOTE — Telephone Encounter (Signed)
Transition Care Management Unsuccessful Follow-up Telephone Call  Date of discharge and from where:  [facility] 04/26/21  Attempts:  2nd Attempt  Reason for unsuccessful TCM follow-up call:  Left voice message Patient has OV scheduled for 04/29/21

## 2021-04-29 ENCOUNTER — Ambulatory Visit (INDEPENDENT_AMBULATORY_CARE_PROVIDER_SITE_OTHER): Payer: Medicare (Managed Care) | Admitting: Osteopathic Medicine

## 2021-04-29 ENCOUNTER — Other Ambulatory Visit: Payer: Self-pay

## 2021-04-29 ENCOUNTER — Encounter: Payer: Self-pay | Admitting: Osteopathic Medicine

## 2021-04-29 VITALS — BP 135/61 | HR 87 | Temp 97.7°F | Wt 178.0 lb

## 2021-04-29 DIAGNOSIS — J449 Chronic obstructive pulmonary disease, unspecified: Secondary | ICD-10-CM | POA: Diagnosis not present

## 2021-04-29 MED ORDER — METOPROLOL SUCCINATE ER 50 MG PO TB24: 50.0000 mg | ORAL_TABLET | Freq: Every day | ORAL | 1 refills | Status: AC

## 2021-04-29 MED ORDER — METOPROLOL SUCCINATE ER 50 MG PO TB24: 50.0000 mg | ORAL_TABLET | Freq: Every day | ORAL | 3 refills | Status: DC

## 2021-04-29 NOTE — Patient Instructions (Addendum)
Heart rhythm showing some beats coming early. Some people are sensitive to this and it can feel funny. It is not dangerous. If symptoms are severe, we would get you set up with a cardiologist to do some more testing and determine if other treatment is needed, but usually we can just monitor this.   In the meantime, let's increase your metoprolol from 25 mg to 50 mg daily! Can double up on what you have now until it's out, and I sent a new prescription for the 50 mg pills to start when you are out of the 25 mg pills.   Please follow up with your pulmonologist before you move up [facility]! Please be sure you have your nebulizer and supplies to use as needed for shortness of breath.

## 2021-04-29 NOTE — Progress Notes (Signed)
David Woodard is a 70 y.o. male who presents to  [facility] at [facility]  today, 04/29/21, seeking care for the following:  . ER follow-up, near syncopal event, dx w/ PAC, ER recommended increase metoprolol XR to BID . Pt reports feeling "strange in the chest" and this sensation is helped by standing up. No CP. Stable SOB/COPD, frequent ER and office visits for same. Denies LOC, no HA/VC.  Marland Kitchen EKG today w/ rhythm strip in office - PAC, no arrhythmia otherwise      ASSESSMENT & PLAN with other pertinent findings:  The encounter diagnosis was Chronic obstructive pulmonary disease, unspecified COPD type ([place]).    Patient Instructions  Heart rhythm showing some beats coming early. Some people are sensitive to this and it can feel funny. It is not dangerous. If symptoms are severe, we would get you set up with a cardiologist to do some more testing and determine if other treatment is needed, but usually we can just monitor this.   In the meantime, let's increase your metoprolol from 25 mg to 50 mg daily! Can double up on what you have now until it's out, and I sent a new prescription for the 50 mg pills to start when you are out of the 25 mg pills.   Please follow up with your pulmonologist before you move up [facility]! Please be sure you have your nebulizer and supplies to use as needed for shortness of breath.      Orders Placed This Encounter  Procedures  . EKG 12-Lead  . Rhythm ECG, report    Meds ordered this encounter  Medications  . DISCONTD: metoprolol succinate (TOPROL-XL) 50 MG 24 hr tablet    Sig: Take 1 tablet (50 mg total) by mouth daily. Take with or immediately following a meal.    Dispense:  90 tablet    Refill:  3  . metoprolol succinate (TOPROL-XL) 50 MG 24 hr tablet    Sig: Take 1 tablet (50 mg total) by mouth daily. Take with or immediately following a meal.    Dispense:  90 tablet    Refill:  1     See below for  relevant physical exam findings  See below for recent lab and imaging results reviewed  Medications, allergies, PMH, PSH, SocH, FamH reviewed below    Follow-up instructions: Return if symptoms worsen or fail to improve.                                        Exam:  BP 135/61 (BP Location: Left Arm, Patient Position: Sitting, Cuff Size: Normal)   Pulse 87   Temp 97.7 F (36.5 C) (Oral)   Wt 178 lb 0.6 oz (80.8 kg)   SpO2 100%   BMI 25.55 kg/m   Constitutional: VS see above. General Appearance: alert, well-developed, well-nourished, NAD  Neck: No masses, trachea midline.   Respiratory: Normal respiratory effort. no wheeze, no rhonchi, no rales  Cardiovascular: S1/S2 normal, no murmur, no rub/gallop auscultated. RRR w/ some skipped/delayed beats on auscultation, NO irreg/irreg   Musculoskeletal: Gait normal. Symmetric and independent movement of all extremities  Abdominal: non-tender, non-distended, no appreciable organomegaly, neg Murphy's, BS [id number]  Neurological: Normal balance/coordination. No tremor.  Skin: warm, dry, intact.   Psychiatric: Normal judgment/insight. Normal mood and affect. Oriented x3.   Current Meds  Medication Sig  .  albuterol (VENTOLIN HFA) 108 (90 Base) MCG/ACT inhaler Inhale 1-2 puffs into the lungs every 4 (four) hours as needed for wheezing or shortness of breath.  Marland Kitchen aspirin 325 MG tablet Take 1 tablet (325 mg total) by mouth daily.  Marland Kitchen atorvastatin (LIPITOR) 40 MG tablet Take 1 tablet (40 mg total) by mouth daily.  Marland Kitchen azelastine (ASTELIN) 0.1 % nasal spray Place 2 sprays into both nostrils 2 (two) times daily. Use in each nostril as needed for sinus congestion  . diclofenac Sodium (VOLTAREN) 1 % GEL Apply 4 g topically 4 (four) times daily. To affected area of aches/pain especially to back of neck.  Marland Kitchen ipratropium-albuterol (DUONEB) 0.5-2.5 (3) MG/3ML SOLN Take 3 mLs by nebulization every 2 (two) hours as needed  (wheeze, SOB).  . mometasone (ASMANEX) 220 MCG/INH inhaler Inhale 2 puffs into the lungs daily.  . promethazine (PHENERGAN) 25 MG tablet Take 1 tablet (25 mg total) by mouth every 8 (eight) hours as needed for nausea or vomiting.  . sertraline (ZOLOFT) 25 MG tablet Take 1 tablet (25 mg total) by mouth daily.  Marland Kitchen umeclidinium-vilanterol (ANORO ELLIPTA) 62.5-25 MCG/INH AEPB Inhale 1 puff into the lungs daily.  . valsartan-hydrochlorothiazide (DIOVAN-HCT) 160-25 MG tablet Take 1 tablet by mouth daily.  . [DISCONTINUED] metoprolol succinate (TOPROL-XL) 25 MG 24 hr tablet Take by mouth.  . [DISCONTINUED] metoprolol succinate (TOPROL-XL) 50 MG 24 hr tablet Take 1 tablet (50 mg total) by mouth daily. Take with or immediately following a meal.    Allergies  Allergen Reactions  . Bupropion Nausea And Vomiting    Patient Active Problem List   Diagnosis Date Noted  . Intertrigo of genitocrural region due to Candida species 09/11/2020  . Laceration of scalp without foreign body 09/11/2020  . Nasal congestion 10/23/2019  . Tubular adenoma of colon 08/30/2018  . Diverticula of colon 08/30/2018  . Dyslipidemia 10/31/2016  . Coronary artery disease with history of myocardial infarction without history of CABG 06/14/2016  . Crohn's ileitis ([place]) 09/15/2015  . Coronary artery disease involving native coronary artery of native heart without angina pectoris 03/30/2015  . S/P coronary artery stent placement 03/30/2015  . History of intussusception 03/16/2015  . Smoking 11/11/2010  . COPD (chronic obstructive pulmonary disease) with chronic bronchitis ([place]) 11/11/2010    Family History  Problem Relation Age of Onset  . Hypertension Father   . Heart disease Father     Social History   Tobacco Use  Smoking Status Former Smoker  . Packs/day: 1.75  . Types: Cigarettes  Smokeless Tobacco Never Used    Past Surgical History:  Procedure Laterality Date  . CERVICAL FUSION    . CORONARY ANGIOPLASTY  WITH STENT PLACEMENT      Immunization History  Administered Date(s) Administered  . PFIZER(Purple Top)SARS-COV-2 Vaccination 01/17/2020, 02/07/2020  . Pneumococcal Conjugate-13 10/17/2018    Recent Results (from the past 2160 hour(s))  COMPLETE METABOLIC PANEL WITH GFR     Status: Abnormal   Collection Time: 03/16/21 12:00 AM  Result Value Ref Range   Glucose, Bld 99 65 - 99 mg/dL    Comment: .            Fasting reference interval .    BUN 17 7 - 25 mg/dL   Creat 1.54 (H) 0.70 - 1.18 mg/dL    Comment: For patients >33 years of age, the reference limit for Creatinine is approximately 13% higher for people identified as African-American. .    GFR, Est Non African American  45 (L) > OR = 60 mL/min/1.72m   GFR, Est African American 52 (L) > OR = 60 mL/min/1.723m  BUN/Creatinine Ratio 11 6 - 22 (calc)   Sodium 125 (L) 135 - 146 mmol/L   Potassium 4.1 3.5 - 5.3 mmol/L   Chloride 88 (L) 98 - 110 mmol/L   CO2 29 20 - 32 mmol/L   Calcium 9.2 8.6 - 10.3 mg/dL   Total Protein 6.8 6.1 - 8.1 g/dL   Albumin 4.1 3.6 - 5.1 g/dL   Globulin 2.7 1.9 - 3.7 g/dL (calc)   AG Ratio 1.5 1.0 - 2.5 (calc)   Total Bilirubin 0.4 0.2 - 1.2 mg/dL   Alkaline phosphatase (APISO) 74 35 - 144 U/L   AST 17 10 - 35 U/L   ALT 17 9 - 46 U/L  BASIC METABOLIC PANEL WITH GFR     Status: Abnormal   Collection Time: 03/19/21  9:14 AM  Result Value Ref Range   Glucose, Bld 100 65 - 139 mg/dL    Comment: .        Non-fasting reference interval .    BUN 19 7 - 25 mg/dL   Creat 1.54 (H) 0.70 - 1.18 mg/dL    Comment: For patients >4912ears of age, the reference limit for Creatinine is approximately 13% higher for people identified as African-American. .    GFR, Est Non African American 45 (L) > OR = 60 mL/min/1.7379m GFR, Est African American 52 (L) > OR = 60 mL/min/1.88m38mBUN/Creatinine Ratio 12 6 - 22 (calc)   Sodium 128 (L) 135 - 146 mmol/L   Potassium 4.1 3.5 - 5.3 mmol/L   Chloride 92 (L) 98  - 110 mmol/L   CO2 29 20 - 32 mmol/L   Calcium 9.2 8.6 - 10.3 mg/dL  Osmolality, urine     Status: None   Collection Time: 03/19/21  9:16 AM  Result Value Ref Range   Osmolality, Ur 246 50 - 1,200 mOsm/kg  Sodium, urine, random     Status: Abnormal   Collection Time: 03/19/21  9:18 AM  Result Value Ref Range   Sodium, Ur 17 (L) 28 - 272 mmol/L    No results found.     All questions at time of visit were answered - patient instructed to contact office with any additional concerns or updates. ER/RTC precautions were reviewed with the patient as applicable.   Please note: manual typing as well as voice recognition software may have been used to produce this document - typos may escape review. Please contact Dr. AlexSheppard Coil any needed clarifications.

## 2021-04-29 NOTE — Telephone Encounter (Signed)
Transition Care Management Follow-up Telephone Call  Date of discharge and from where: [facility] 04/26/21  How have you been since you were released from the hospital? Patient had OV 04/29/21  Any questions or concerns? No

## 2021-05-12 ENCOUNTER — Other Ambulatory Visit: Payer: Self-pay

## 2021-05-12 MED ORDER — VALSARTAN-HYDROCHLOROTHIAZIDE 160-25 MG PO TABS: 1.0000 | ORAL_TABLET | Freq: Every day | ORAL | 1 refills | Status: AC

## 2021-05-13 ENCOUNTER — Other Ambulatory Visit: Payer: Self-pay | Admitting: Osteopathic Medicine

## 2021-08-16 ENCOUNTER — Ambulatory Visit: Payer: Medicare Other | Admitting: Osteopathic Medicine

## 2021-10-08 ENCOUNTER — Other Ambulatory Visit: Payer: Self-pay

## 2021-10-08 DIAGNOSIS — R42 Dizziness and giddiness: Secondary | ICD-10-CM
